# Patient Record
Sex: Female | Born: 1980 | Race: White | Hispanic: No | Marital: Married | Smoking: Former smoker
Health system: Southern US, Community
[De-identification: ages and names within clinical notes are randomized; demographics above are authoritative.]

## PROBLEM LIST (undated history)

## (undated) DIAGNOSIS — G4730 Sleep apnea, unspecified: Secondary | ICD-10-CM

## (undated) DIAGNOSIS — F329 Major depressive disorder, single episode, unspecified: Secondary | ICD-10-CM

## (undated) DIAGNOSIS — F1021 Alcohol dependence, in remission: Secondary | ICD-10-CM

## (undated) DIAGNOSIS — K219 Gastro-esophageal reflux disease without esophagitis: Secondary | ICD-10-CM

## (undated) DIAGNOSIS — F32A Depression, unspecified: Secondary | ICD-10-CM

## (undated) DIAGNOSIS — J302 Other seasonal allergic rhinitis: Secondary | ICD-10-CM

## (undated) DIAGNOSIS — F419 Anxiety disorder, unspecified: Secondary | ICD-10-CM

## (undated) DIAGNOSIS — A630 Anogenital (venereal) warts: Secondary | ICD-10-CM

## (undated) DIAGNOSIS — D696 Thrombocytopenia, unspecified: Secondary | ICD-10-CM

## (undated) HISTORY — DX: Alcohol dependence, in remission: F10.21

## (undated) HISTORY — DX: Sleep apnea, unspecified: G47.30

## (undated) HISTORY — PX: WISDOM TOOTH EXTRACTION: SHX21

---

## 2001-05-18 ENCOUNTER — Emergency Department (HOSPITAL_COMMUNITY): Admission: EM | Admit: 2001-05-18 | Discharge: 2001-05-18 | Payer: Self-pay | Admitting: Emergency Medicine

## 2001-08-21 ENCOUNTER — Emergency Department (HOSPITAL_COMMUNITY): Admission: EM | Admit: 2001-08-21 | Discharge: 2001-08-21 | Payer: Self-pay | Admitting: *Deleted

## 2001-09-04 ENCOUNTER — Inpatient Hospital Stay (HOSPITAL_COMMUNITY): Admission: EM | Admit: 2001-09-04 | Discharge: 2001-09-08 | Payer: Self-pay | Admitting: Psychiatry

## 2001-11-04 ENCOUNTER — Encounter: Payer: Self-pay | Admitting: Emergency Medicine

## 2001-11-04 ENCOUNTER — Emergency Department (HOSPITAL_COMMUNITY): Admission: EM | Admit: 2001-11-04 | Discharge: 2001-11-04 | Payer: Self-pay | Admitting: Emergency Medicine

## 2002-11-14 ENCOUNTER — Emergency Department (HOSPITAL_COMMUNITY): Admission: EM | Admit: 2002-11-14 | Discharge: 2002-11-14 | Payer: Self-pay | Admitting: Emergency Medicine

## 2002-11-21 ENCOUNTER — Emergency Department (HOSPITAL_COMMUNITY): Admission: EM | Admit: 2002-11-21 | Discharge: 2002-11-21 | Payer: Self-pay | Admitting: Emergency Medicine

## 2010-10-20 ENCOUNTER — Other Ambulatory Visit
Admission: RE | Admit: 2010-10-20 | Discharge: 2010-10-20 | Payer: Self-pay | Source: Home / Self Care | Admitting: Obstetrics and Gynecology

## 2011-03-10 NOTE — Discharge Summary (Signed)
[facility]  Patient:    Patricia Rivas, Patricia Rivas Visit Number: [id number] MRN: [id number]          Service Type: PSY Location: 500 0501 01 Attending Physician:  Jeanice Lim Dictated by:   Reymundo Poll Dub Mikes, M.D. Admit Date:  09/04/2001 Discharge Date: 09/08/2001                             Discharge Summary  CHIEF COMPLAINT AND HISTORY OF PRESENT ILLNESS:  This was the first admission to [facility] for this 30 year old female who presented with a history of bipolar disorder.  Had come to live with a friend. They were talking about things in her life.  [place] she was hearing a pounding in her ears with thoughts of taking enough pills to overdose and kill herself.  Called [facility] in terms of doing any self-harm.  Inpatient treatment was recommended.  Reported increased depression for a month.  Her aunt had died about four weeks prior to this admission, her mother had some physical problems, she needed some surgery, and some financial stressors.  Sleeping had been decreased over the past couple of weeks.  Appetite had decreased with a 10 pound weight loss over a month.  She also had a history of borderline personality, history of biting herself.  She also had been drinking some more to calm herself down.  PAST PSYCHIATRIC HISTORY:  Dr. Vilinda Blanks on an outpatient basis.  Reported biting her arm and hand with the last time of self-injury the day before, biting her hand.  Also sees Dr. Mikey Bussing as a therapist.  SUBSTANCE ABUSE HISTORY:  Drinking has increased, drinking a drink or more per day up to five drinks per day stating that she gets more drunk than usual; history of blackouts with no seizures; to calm herself down.  PAST MEDICAL HISTORY:  Thrombocytopenia.  MEDICATIONS ON ADMISSION: 1. Trileptal 1200 mg. 2. Zoloft 100 mg per day.  PHYSICAL EXAMINATION:  GENERAL:   Performed and failed to show any active findings.  MENTAL STATUS EXAMINATION ON ADMISSION:  Alert young female dressed casually, poor eye contact.  Speech was normal and relevant.  Mood was euthymic.  Affect was pleasant.  Thought processes: Coherent, no evidence of psychosis, no auditory or visual hallucinations, no suicidal or homicidal ideas, no paranoia.  Cognitive: Intact.  ADMITTING DIAGNOSES: Axis I:    1. Bipolar disorder, depressed.            2. Alcohol abuse. Axis II:   Borderline personality disorder. Axis III:  Thrombocytopenia. Axis IV:   Moderate. Axis V:    Global assessment of functioning upon admission 30, highest global            assessment of functioning in the last year 60-65.  HOSPITAL COURSE:  She was admitted and started in intensive individual and group psychotherapy.  She was placed on Seroquel 50 mg per day as well as Lamictal 25 mg twice a day.  She had been off her carbatrol due to side effects, has had a lot of stressful events going on which had precipitated this decompensation.  Family session between the patient and her partner was positive, soliciting support from her.  On November 17, her mood had improved. Affect was brighter.  Less depressed and anxious, no suicidal ideas, no homicidal ideas, no psychosis or perceptual disturbance.  Committed to active recovery.  Able to be managed safely in a less restrictive setting for which discharge was considered and granted.  DISCHARGE DIAGNOSES: Axis I:    1. Bipolar disorder, depressed.            2. Alcohol abuse. Axis II:   Borderline personality disorder. Axis III:  Thrombocytopenia. Axis IV:   Moderate. Axis V:    Global assessment of functioning upon discharge 55-60.  DISCHARGE MEDICATIONS: 1. Zoloft 100 mg per day. 2. Lamictal 25 mg twice a day. 3. Seroquel 50 mg at bedtime. 4. Ambien 10 mg at bedtime for sleep.  FOLLOWUP:  [facility] PACT Team. Dictated by:    Reymundo Poll. Dub Mikes, M.D. Attending Physician:  Jeanice Lim DD:  10/09/01 TD:  10/10/01 Job: [id number]
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
## 2012-01-25 ENCOUNTER — Other Ambulatory Visit (HOSPITAL_COMMUNITY)
Admission: RE | Admit: 2012-01-25 | Discharge: 2012-01-25 | Disposition: A | Discharge status: Home / Self Care | Payer: Self-pay | Source: Ambulatory Visit | Attending: Obstetrics and Gynecology | Admitting: Obstetrics and Gynecology

## 2012-01-25 DIAGNOSIS — Z01419 Encounter for gynecological examination (general) (routine) without abnormal findings: Secondary | ICD-10-CM | POA: Insufficient documentation

## 2012-10-23 NOTE — L&D Delivery Note (Signed)
Delivery Note At 7:12 PM a viable female was delivered via Vaginal, Spontaneous Delivery (Presentation: Middle Occiput Posterior).  APGAR8 and 9  per neonatologist , ; weight .   Placenta status: , Pathology.  Cord: 3 vessels with the following complications: None.  Cord pH: pending   Anesthesia: Epidural  Episiotomy: None Lacerations: 1st degree;Perineal Suture Repair: 3.0 vicryl Est. Blood Loss (mL): 200 ml  Mom to postpartum.  Baby to NICU.  Jibri Schriefer J. 05/12/2013, 7:29 PM

## 2012-11-11 LAB — OB RESULTS CONSOLE HEPATITIS B SURFACE ANTIGEN: Hepatitis B Surface Ag: NEGATIVE

## 2012-11-11 LAB — OB RESULTS CONSOLE ANTIBODY SCREEN: Antibody Screen: NEGATIVE

## 2012-11-11 LAB — OB RESULTS CONSOLE RPR: RPR: NONREACTIVE

## 2012-11-20 ENCOUNTER — Telehealth: Payer: Self-pay | Admitting: Internal Medicine

## 2012-11-20 NOTE — Telephone Encounter (Signed)
S/W WITH PT IN REF TO NP APPT. ON 12/03/12@9 :30 REFERRING DR Richardson Dopp DX-THROMBOCYTOPENIA-PREGNANT MAILED NP APPT.

## 2012-11-20 NOTE — Telephone Encounter (Signed)
C/D 11/20/12 for appt. 12/03/12

## 2012-11-27 LAB — OB RESULTS CONSOLE GC/CHLAMYDIA
Chlamydia: NEGATIVE
Gonorrhea: NEGATIVE

## 2012-12-03 ENCOUNTER — Ambulatory Visit: Payer: Managed Care, Other (non HMO)

## 2012-12-03 ENCOUNTER — Encounter: Payer: Self-pay | Admitting: Internal Medicine

## 2012-12-03 ENCOUNTER — Telehealth: Payer: Self-pay | Admitting: Internal Medicine

## 2012-12-03 ENCOUNTER — Other Ambulatory Visit (HOSPITAL_BASED_OUTPATIENT_CLINIC_OR_DEPARTMENT_OTHER): Payer: Managed Care, Other (non HMO) | Admitting: Lab

## 2012-12-03 ENCOUNTER — Ambulatory Visit (HOSPITAL_BASED_OUTPATIENT_CLINIC_OR_DEPARTMENT_OTHER): Payer: Managed Care, Other (non HMO) | Admitting: Internal Medicine

## 2012-12-03 VITALS — BP 136/78 | HR 86 | Temp 98.1°F | Resp 20 | Ht 66.0 in | Wt 236.6 lb

## 2012-12-03 DIAGNOSIS — D689 Coagulation defect, unspecified: Secondary | ICD-10-CM

## 2012-12-03 DIAGNOSIS — D696 Thrombocytopenia, unspecified: Secondary | ICD-10-CM

## 2012-12-03 HISTORY — DX: Thrombocytopenia, unspecified: D69.6

## 2012-12-03 LAB — CBC WITH DIFFERENTIAL/PLATELET
BASO%: 0.3 % (ref 0.0–2.0)
Basophils Absolute: 0 10*3/uL (ref 0.0–0.1)
EOS%: 0.8 % (ref 0.0–7.0)
Eosinophils Absolute: 0.1 10*3/uL (ref 0.0–0.5)
HCT: 41 % (ref 34.8–46.6)
HGB: 14.2 g/dL (ref 11.6–15.9)
LYMPH%: 24.7 % (ref 14.0–49.7)
MCH: 28.7 pg (ref 25.1–34.0)
MCHC: 34.6 g/dL (ref 31.5–36.0)
MCV: 83 fL (ref 79.5–101.0)
MONO#: 0.5 10*3/uL (ref 0.1–0.9)
MONO%: 6.6 % (ref 0.0–14.0)
NEUT#: 5.3 10*3/uL (ref 1.5–6.5)
NEUT%: 67.6 % (ref 38.4–76.8)
Platelets: 97 10*3/uL — ABNORMAL LOW (ref 145–400)
RBC: 4.94 10*6/uL (ref 3.70–5.45)
RDW: 12.2 % (ref 11.2–14.5)
WBC: 7.8 10*3/uL (ref 3.9–10.3)
lymph#: 1.9 10*3/uL (ref 0.9–3.3)
nRBC: 0 % (ref 0–0)

## 2012-12-03 LAB — COMPREHENSIVE METABOLIC PANEL (CC13)
ALT: 23 U/L (ref 0–55)
CO2: 24 mEq/L (ref 22–29)
Calcium: 10.1 mg/dL (ref 8.4–10.4)
Chloride: 104 mEq/L (ref 98–107)
Creatinine: 0.6 mg/dL (ref 0.6–1.1)
Glucose: 87 mg/dl (ref 70–99)
Total Protein: 7.3 g/dL (ref 6.4–8.3)

## 2012-12-03 LAB — LACTATE DEHYDROGENASE (CC13): LDH: 178 U/L (ref 125–245)

## 2012-12-03 NOTE — Progress Notes (Signed)
[facility] [phone]   [phone]  CONSULT NOTE  REFERRING PHYSICIAN: Dr. Gerald Leitz  REASON FOR CONSULTATION:  32 years old pregnant lady with thrombocytopenia  HPI Patricia Rivas is a 32 y.o. female with no significant past medical history except for long history of thrombocytopenia ranging between 55,000-80,000 since age 50 but with no significant bleeding issues. The patient is currently [redacted] weeks pregnant. She was seen recently by her obstetrician and CBC showed platelets count of 67,000. She was referred to me today for further evaluation and recommendation regarding her thrombocytopenia. The patient denied having any bleeding issues, bruises or ecchymosis. She denied having any, or nosebleeds. She has a history of nosebleeds as a teenager but not since that time. She previously evaluated by hematologist in [place], [place] but this has been many years ago.  She is feeling fine today with no specific complaints. She denied having any fever or chills. She has no chest pain or shortness of breath. HIV test was nonreactive. Hepatitis B screening was negative. TSH was normal. Blood work by her obstetrician showed positive rheumatoid factor.   PAST MEDICAL HISTORY: History of thrombocytopenia and depression. The patient denied having any history of hypertension, diabetes mellitus, coronary artery disease, hypercholesterolemia or stroke.  FAMILY HISTORY: Mother had heart disease and father prostate and colon cancer   SOCIAL HISTORY: She is married and currently pregnant and her first child 11 weeks. 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 She is a former smoker smoked for around 15 years but quit to one half years ago. She also quit alcohol a year ago and no history of drug abuse.      No Known Allergies  Current Outpatient Prescriptions  Medication Sig Dispense Refill  . docusate sodium (COLACE) 100 MG capsule Take 100 mg by mouth  daily.      . Prenatal Vit-Fe Fumarate-FA (MULTIVITAMIN-PRENATAL) 27-0.8 MG TABS Take 1 tablet by mouth daily.       No current facility-administered medications for this visit.    Review of Systems  A comprehensive review of systems was negative.  Physical Exam  AOZ:HYQMV, healthy, no distress, well nourished and well developed SKIN: skin color, texture, turgor are normal HEAD: Normocephalic, No masses, lesions, tenderness or abnormalities EYES: normal, PERRLA EARS: External ears normal OROPHARYNX:no exudate and no erythema  NECK: supple, no adenopathy LYMPH:  no palpable lymphadenopathy, no hepatosplenomegaly BREAST:not examined LUNGS: clear to auscultation  HEART: regular rate & rhythm and no murmurs ABDOMEN:abdomen soft, non-tender, obese, normal bowel sounds and no masses or organomegaly BACK: Back symmetric, no curvature. EXTREMITIES:no joint deformities, effusion, or inflammation, no edema, no skin discoloration, no clubbing  NEURO: alert & oriented x 3 with fluent speech, no focal motor/sensory deficits  PERFORMANCE STATUS: ECOG 0  LABORATORY DATA: Lab Results  Component Value Date   WBC 7.8 12/03/2012   HGB 14.2 12/03/2012   HCT 41.0 12/03/2012   MCV 83.0 12/03/2012   PLT 97* 12/03/2012      Chemistry      Component Value Date/Time   NA 137 12/03/2012 1030   K 3.8 12/03/2012 1030   CL 104 12/03/2012 1030   CO2 24 12/03/2012 1030   BUN 10.9 12/03/2012 1030   CREATININE 0.6 12/03/2012 1030      Component Value Date/Time   CALCIUM 10.1 12/03/2012 1030   ALKPHOS 61 12/03/2012 1030   AST 18 12/03/2012 1030   ALT 23 12/03/2012 1030   BILITOT  0.38 12/03/2012 1030       RADIOGRAPHIC STUDIES: No results found.  ASSESSMENT: This is a very pleasant 32 years old white female with history of persistent thrombocytopenia suspicious for ITP. The patient is currently pregnant [redacted] weeks. Her platelets count today 97,000 which is higher than what was seen at her obstetrician's  office. There was also few clumps of her platelets on the peripheral smear.  PLAN: I have a lengthy discussion with the patient today about her condition. The patient is currently asymptomatic with no bleeding, bruises or ecchymosis. I recommended for her continuous observation for now. I would see her back for followup visit in 2 months with repeat CBC and LDH. The patient was advised to call me immediately if she has any significant bleeding, bruises or ecchymosis or if her platelets count is less than 50,000. I will continue to monitor her platelets counts closely during her pregnancy. This is expected to go further down as the patient progress with her pregnancy secondary to dilutional effect with increasing volume of her blood in the third trimester. She could be considered for treatment with prednisone in the third trimester in preparation for delivery of her platelets count is less than 80,000. I gave the patient the time to ask questions and I answered them completely to her satisfaction. She knows to contact me immediately if she has any concerning symptoms All questions were answered. The patient knows to call the clinic with any problems, questions or concerns. We can certainly see the patient much sooner if necessary.  Thank you so much for allowing me to participate in the care of Patricia Rivas. I will continue to follow up the patient with you and assist in her care.  I spent 35 minutes counseling the patient face to face. The total time spent in the appointment was 55 minutes.  Jomaira Darr K. 12/03/2012, 11:19 AM

## 2012-12-03 NOTE — Progress Notes (Signed)
Checked in new patient. No financial needs. She just got married so insurance is still in her maiden name. Married name is Castner.

## 2012-12-03 NOTE — Patient Instructions (Signed)
Platelets count are low but stable and you are currently asymptomatic. Continue on observation for now with repeat CBC in 2 months. Please call if you have any questions.

## 2013-02-04 ENCOUNTER — Ambulatory Visit (HOSPITAL_BASED_OUTPATIENT_CLINIC_OR_DEPARTMENT_OTHER): Payer: Managed Care, Other (non HMO) | Admitting: Internal Medicine

## 2013-02-04 ENCOUNTER — Encounter: Payer: Self-pay | Admitting: Internal Medicine

## 2013-02-04 ENCOUNTER — Telehealth: Payer: Self-pay | Admitting: Internal Medicine

## 2013-02-04 ENCOUNTER — Other Ambulatory Visit (HOSPITAL_BASED_OUTPATIENT_CLINIC_OR_DEPARTMENT_OTHER): Payer: Managed Care, Other (non HMO) | Admitting: Lab

## 2013-02-04 VITALS — BP 129/58 | HR 74 | Temp 98.5°F | Resp 18 | Ht 66.0 in | Wt 241.9 lb

## 2013-02-04 DIAGNOSIS — D696 Thrombocytopenia, unspecified: Secondary | ICD-10-CM

## 2013-02-04 DIAGNOSIS — O99891 Other specified diseases and conditions complicating pregnancy: Secondary | ICD-10-CM

## 2013-02-04 DIAGNOSIS — D693 Immune thrombocytopenic purpura: Secondary | ICD-10-CM

## 2013-02-04 LAB — CBC WITH DIFFERENTIAL/PLATELET
BASO%: 0.5 % (ref 0.0–2.0)
EOS%: 1.1 % (ref 0.0–7.0)
HGB: 13.1 g/dL (ref 11.6–15.9)
MCH: 28 pg (ref 25.1–34.0)
MCHC: 33.5 g/dL (ref 31.5–36.0)
RDW: 12.5 % (ref 11.2–14.5)
WBC: 11.3 10*3/uL — ABNORMAL HIGH (ref 3.9–10.3)
lymph#: 1.9 10*3/uL (ref 0.9–3.3)

## 2013-02-04 LAB — COMPREHENSIVE METABOLIC PANEL (CC13)
ALT: 14 U/L (ref 0–55)
AST: 13 U/L (ref 5–34)
Albumin: 2.8 g/dL — ABNORMAL LOW (ref 3.5–5.0)
Alkaline Phosphatase: 74 U/L (ref 40–150)
BUN: 8.9 mg/dL (ref 7.0–26.0)
Calcium: 9.2 mg/dL (ref 8.4–10.4)
Chloride: 105 mEq/L (ref 98–107)
Potassium: 3.9 mEq/L (ref 3.5–5.1)
Sodium: 137 mEq/L (ref 136–145)

## 2013-02-04 LAB — LACTATE DEHYDROGENASE (CC13): LDH: 163 U/L (ref 125–245)

## 2013-02-04 NOTE — Progress Notes (Signed)
[facility] [phone]   [phone]  OFFICE PROGRESS NOTE  No primary provider on file. No primary provider on file.  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  PRIOR THERAPY: None  CURRENT THERAPY: Observation.  INTERVAL HISTORY: Patricia Rivas 32 y.o. female returns to the clinic today for routine three-month followup visit. The patient is feeling fine today with no specific complaints. She denied having any significant bleeding issues, bruises or ecchymosis. Her pregnancy is progressing fairly well with no significant complaints. The patient had repeat CBC performed earlier today and she is here for evaluation and discussion of her lab results. She is currently [redacted] weeks pregnant. She denied having any significant fatigue or weakness. She denied having any chest pain, shortness of breath, cough or hemoptysis.  ALLERGIES:  has No Known Allergies.  MEDICATIONS:  Current Outpatient Prescriptions  Medication Sig Dispense Refill  . Docosahexaenoic Acid (DHA OMEGA 3 PO) Take 1 capsule by mouth daily.      Marland Kitchen docusate sodium (COLACE) 100 MG capsule Take 100 mg by mouth daily.      . Prenatal Vit-Fe Fumarate-FA (MULTIVITAMIN-PRENATAL) 27-0.8 MG TABS Take 1 tablet by mouth daily.      . ranitidine (ZANTAC) 150 MG capsule Take 150 mg by mouth every evening.       No current facility-administered medications for this visit.    REVIEW OF SYSTEMS:  A comprehensive review of systems was negative.   PHYSICAL EXAMINATION: General appearance: alert, cooperative and no distress Head: Normocephalic, without obvious abnormality, atraumatic Neck: no adenopathy Lymph nodes: Cervical, supraclavicular, and axillary nodes normal. Resp: clear to auscultation bilaterally Cardio: regular rate and rhythm, S1, S2 normal, no murmur, click, rub or gallop GI: Distended corresponding to her current pregnancy Extremities: extremities normal,  atraumatic, no cyanosis or edema  ECOG PERFORMANCE STATUS: 0 - Asymptomatic  Blood pressure 129/58, pulse 74, temperature 98.5 F (36.9 C), temperature source Oral, resp. rate 18, height 5\' 6"  (1.676 m), weight 241 lb 14.4 oz (109.725 kg).  LABORATORY DATA: Lab Results  Component Value Date   WBC 11.3* 02/04/2013   HGB 13.1 02/04/2013   HCT 39.0 02/04/2013   MCV 83.6 02/04/2013   PLT 79* 02/04/2013      Chemistry      Component Value Date/Time   NA 137 12/03/2012 1030   K 3.8 12/03/2012 1030   CL 104 12/03/2012 1030   CO2 24 12/03/2012 1030   BUN 10.9 12/03/2012 1030   CREATININE 0.6 12/03/2012 1030      Component Value Date/Time   CALCIUM 10.1 12/03/2012 1030   ALKPHOS 61 12/03/2012 1030   AST 18 12/03/2012 1030   ALT 23 12/03/2012 1030   BILITOT 0.38 12/03/2012 1030       RADIOGRAPHIC STUDIES: No results found.  ASSESSMENT: This is a very pleasant 32 years old white female who is currently [redacted] weeks pregnant with history of idiopathic thrombocytopenic purpura. The patient is currently asymptomatic in her platelets count are still acceptable at 79,000.   PLAN: I discussed the lab result with the patient. I recommended for her to continue on observation with repeat CBC in 2 months. She was advised to call me immediately she has any concerning symptoms in the interval her platelets count less than 50,000.  All questions were answered. The patient knows to call the clinic with any problems, questions or concerns. We can certainly see the patient much sooner if necessary.

## 2013-02-04 NOTE — Patient Instructions (Signed)
Platelets count are still low but stable. Followup in 2 months with repeat CBC.

## 2013-04-07 ENCOUNTER — Encounter: Payer: Self-pay | Admitting: Internal Medicine

## 2013-04-07 ENCOUNTER — Telehealth: Payer: Self-pay | Admitting: Internal Medicine

## 2013-04-07 ENCOUNTER — Other Ambulatory Visit (HOSPITAL_BASED_OUTPATIENT_CLINIC_OR_DEPARTMENT_OTHER): Payer: Managed Care, Other (non HMO) | Admitting: Lab

## 2013-04-07 ENCOUNTER — Ambulatory Visit (HOSPITAL_BASED_OUTPATIENT_CLINIC_OR_DEPARTMENT_OTHER): Payer: Managed Care, Other (non HMO) | Admitting: Internal Medicine

## 2013-04-07 VITALS — BP 129/59 | HR 74 | Temp 97.9°F | Resp 18 | Ht 66.0 in | Wt 246.9 lb

## 2013-04-07 DIAGNOSIS — D696 Thrombocytopenia, unspecified: Secondary | ICD-10-CM

## 2013-04-07 DIAGNOSIS — D693 Immune thrombocytopenic purpura: Secondary | ICD-10-CM

## 2013-04-07 DIAGNOSIS — Z331 Pregnant state, incidental: Secondary | ICD-10-CM

## 2013-04-07 LAB — CBC WITH DIFFERENTIAL/PLATELET
BASO%: 0.5 % (ref 0.0–2.0)
Basophils Absolute: 0.1 10*3/uL (ref 0.0–0.1)
EOS%: 0.8 % (ref 0.0–7.0)
HGB: 13 g/dL (ref 11.6–15.9)
MCH: 28.5 pg (ref 25.1–34.0)
MCHC: 34.3 g/dL (ref 31.5–36.0)
MCV: 83.1 fL (ref 79.5–101.0)
MONO%: 5.2 % (ref 0.0–14.0)
NEUT%: 79.6 % — ABNORMAL HIGH (ref 38.4–76.8)
RDW: 12.6 % (ref 11.2–14.5)

## 2013-04-07 NOTE — Progress Notes (Signed)
[facility] [phone]   [phone]  OFFICE PROGRESS NOTE  Jessee Avers., MD 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   PRIOR THERAPY: None   CURRENT THERAPY: Observation.  INTERVAL HISTORY: Patricia Rivas 32 y.o. female returns to the clinic today for routine followup visit. The patient is currently [redacted] week pregnant. She denied having any bleeding issues. She denied having any bruises or ecchymosis. She continues to have mild fatigue. She has repeat CBC performed earlier today and she is here for evaluation and discussion of her lab results. She denied having any nausea or vomiting, no chest pain, no shortness breath, cough or hemoptysis.  ALLERGIES:  has No Known Allergies.  MEDICATIONS:  Current Outpatient Prescriptions  Medication Sig Dispense Refill  . Docosahexaenoic Acid (DHA OMEGA 3 PO) Take 1 capsule by mouth daily.      Marland Kitchen docusate sodium (COLACE) 100 MG capsule Take 100 mg by mouth daily.      . Prenatal Vit-Fe Fumarate-FA (MULTIVITAMIN-PRENATAL) 27-0.8 MG TABS Take 1 tablet by mouth daily.      . ranitidine (ZANTAC) 150 MG capsule Take 150 mg by mouth every evening.       No current facility-administered medications for this visit.    REVIEW OF SYSTEMS:  A comprehensive review of systems was negative.   PHYSICAL EXAMINATION: General appearance: alert, cooperative, fatigued and no distress Head: Normocephalic, without obvious abnormality, atraumatic Neck: no adenopathy Lymph nodes: Cervical, supraclavicular, and axillary nodes normal. Resp: clear to auscultation bilaterally Cardio: regular rate and rhythm, S1, S2 normal, no murmur, click, rub or gallop GI: Distended secondary to pregnancy Extremities: extremities normal, atraumatic, no cyanosis or edema  ECOG PERFORMANCE STATUS: 1 - Symptomatic but completely ambulatory  Blood pressure 129/59,  pulse 74, temperature 97.9 F (36.6 C), temperature source Oral, resp. rate 18, height 5\' 6"  (1.676 m), weight 246 lb 14.4 oz (111.993 kg).  LABORATORY DATA: Lab Results  Component Value Date   WBC 11.1* 04/07/2013   HGB 13.0 04/07/2013   HCT 37.9 04/07/2013   MCV 83.1 04/07/2013   PLT 63* 04/07/2013      Chemistry      Component Value Date/Time   NA 137 02/04/2013 1102   K 3.9 02/04/2013 1102   CL 105 02/04/2013 1102   CO2 22 02/04/2013 1102   BUN 8.9 02/04/2013 1102   CREATININE 0.6 02/04/2013 1102      Component Value Date/Time   CALCIUM 9.2 02/04/2013 1102   ALKPHOS 74 02/04/2013 1102   AST 13 02/04/2013 1102   ALT 14 02/04/2013 1102   BILITOT 0.23 02/04/2013 1102       RADIOGRAPHIC STUDIES: No results found.  ASSESSMENT AND PLAN: This is a very pleasant 32 years old white female with history of idiopathic thrombocytopenic purpura, currently pregnant [redacted] weeks. The patient is currently asymptomatic from her ITP. She has no bleeding, bruises or ecchymosis. Platelets count are 63,000 today. I will continue to monitor the patient closely. I would see her back for followup visit in 6 weeks for reevaluation before her delivery. I may consider her for steroid treatment at that time to increase her platelets count in preparation for the delivery. The patient was advised to call me immediately if she has any concerning symptoms in the interval.  All questions were answered. The patient knows to call the clinic with any problems, questions or concerns.  We can certainly see the patient much sooner if necessary.

## 2013-04-07 NOTE — Telephone Encounter (Signed)
gave pt appt for July 2014 lab and MD

## 2013-04-07 NOTE — Patient Instructions (Signed)
Continue on observation for now.  Followup visit in 6 weeks.

## 2013-04-29 ENCOUNTER — Telehealth: Payer: Self-pay | Admitting: Internal Medicine

## 2013-04-29 NOTE — Telephone Encounter (Signed)
lvm for pt regarding to time change of 7.28.14 appt....mailed pt appt sched/avs and letter

## 2013-05-10 ENCOUNTER — Inpatient Hospital Stay (HOSPITAL_COMMUNITY)
Admission: AD | Admit: 2013-05-10 | Discharge: 2013-05-14 | DRG: 775 | Disposition: A | Discharge status: Home / Self Care | Payer: Managed Care, Other (non HMO) | Source: Ambulatory Visit | Attending: Obstetrics and Gynecology | Admitting: Obstetrics and Gynecology

## 2013-05-10 DIAGNOSIS — O4290 Premature rupture of membranes, unspecified as to length of time between rupture and onset of labor, unspecified weeks of gestation: Secondary | ICD-10-CM | POA: Diagnosis present

## 2013-05-10 DIAGNOSIS — O26899 Other specified pregnancy related conditions, unspecified trimester: Secondary | ICD-10-CM | POA: Diagnosis present

## 2013-05-10 DIAGNOSIS — D689 Coagulation defect, unspecified: Secondary | ICD-10-CM | POA: Diagnosis present

## 2013-05-10 DIAGNOSIS — Z6791 Unspecified blood type, Rh negative: Secondary | ICD-10-CM | POA: Diagnosis present

## 2013-05-10 DIAGNOSIS — O360131 Maternal care for anti-D [Rh] antibodies, third trimester, fetus 1: Secondary | ICD-10-CM

## 2013-05-10 DIAGNOSIS — O09213 Supervision of pregnancy with history of pre-term labor, third trimester: Secondary | ICD-10-CM

## 2013-05-10 DIAGNOSIS — O42019 Preterm premature rupture of membranes, onset of labor within 24 hours of rupture, unspecified trimester: Secondary | ICD-10-CM | POA: Diagnosis present

## 2013-05-10 DIAGNOSIS — F411 Generalized anxiety disorder: Secondary | ICD-10-CM | POA: Diagnosis not present

## 2013-05-10 DIAGNOSIS — O4200 Premature rupture of membranes, onset of labor within 24 hours of rupture, unspecified weeks of gestation: Secondary | ICD-10-CM

## 2013-05-10 DIAGNOSIS — D696 Thrombocytopenia, unspecified: Secondary | ICD-10-CM | POA: Diagnosis present

## 2013-05-10 HISTORY — DX: Anogenital (venereal) warts: A63.0

## 2013-05-10 HISTORY — DX: Depression, unspecified: F32.A

## 2013-05-10 HISTORY — DX: Anxiety disorder, unspecified: F41.9

## 2013-05-10 HISTORY — DX: Major depressive disorder, single episode, unspecified: F32.9

## 2013-05-10 HISTORY — DX: Other seasonal allergic rhinitis: J30.2

## 2013-05-10 HISTORY — DX: Thrombocytopenia, unspecified: D69.6

## 2013-05-10 HISTORY — DX: Gastro-esophageal reflux disease without esophagitis: K21.9

## 2013-05-11 ENCOUNTER — Encounter (HOSPITAL_COMMUNITY): Payer: Self-pay | Admitting: Anesthesiology

## 2013-05-11 ENCOUNTER — Encounter (HOSPITAL_COMMUNITY): Payer: Self-pay | Admitting: *Deleted

## 2013-05-11 DIAGNOSIS — O42019 Preterm premature rupture of membranes, onset of labor within 24 hours of rupture, unspecified trimester: Secondary | ICD-10-CM | POA: Diagnosis present

## 2013-05-11 DIAGNOSIS — F411 Generalized anxiety disorder: Secondary | ICD-10-CM | POA: Diagnosis not present

## 2013-05-11 DIAGNOSIS — Z6791 Unspecified blood type, Rh negative: Secondary | ICD-10-CM | POA: Diagnosis present

## 2013-05-11 DIAGNOSIS — O26899 Other specified pregnancy related conditions, unspecified trimester: Secondary | ICD-10-CM | POA: Diagnosis present

## 2013-05-11 HISTORY — DX: Preterm premature rupture of membranes, onset of labor within 24 hours of rupture, unspecified trimester: O42.019

## 2013-05-11 HISTORY — DX: Generalized anxiety disorder: F41.1

## 2013-05-11 LAB — CBC
HCT: 37.6 % (ref 36.0–46.0)
Platelets: 76 10*3/uL — ABNORMAL LOW (ref 150–400)
RDW: 12.4 % (ref 11.5–15.5)
WBC: 15.1 10*3/uL — ABNORMAL HIGH (ref 4.0–10.5)

## 2013-05-11 LAB — URINALYSIS, ROUTINE W REFLEX MICROSCOPIC
Bilirubin Urine: NEGATIVE
Nitrite: NEGATIVE
Specific Gravity, Urine: 1.01 (ref 1.005–1.030)
pH: 6 (ref 5.0–8.0)

## 2013-05-11 LAB — OB RESULTS CONSOLE GBS: GBS: NEGATIVE

## 2013-05-11 LAB — COMPREHENSIVE METABOLIC PANEL
ALT: 13 U/L (ref 0–35)
AST: 15 U/L (ref 0–37)
Albumin: 2.9 g/dL — ABNORMAL LOW (ref 3.5–5.2)
CO2: 22 mEq/L (ref 19–32)
Calcium: 10.1 mg/dL (ref 8.4–10.5)
Creatinine, Ser: 0.56 mg/dL (ref 0.50–1.10)
Sodium: 135 mEq/L (ref 135–145)
Total Protein: 6.5 g/dL (ref 6.0–8.3)

## 2013-05-11 LAB — URINE MICROSCOPIC-ADD ON

## 2013-05-11 LAB — TYPE AND SCREEN
ABO/RH(D): O NEG
Weak D: POSITIVE

## 2013-05-11 LAB — GROUP B STREP BY PCR: Group B strep by PCR: NEGATIVE

## 2013-05-11 MED ORDER — ZOLPIDEM TARTRATE 5 MG PO TABS
5.0000 mg | ORAL_TABLET | Freq: Every evening | ORAL | Status: DC | PRN
  Administered 2013-05-11: 5 mg via ORAL
  Filled 2013-05-11: qty 1

## 2013-05-11 MED ORDER — CALCIUM CARBONATE ANTACID 500 MG PO CHEW: 2.0000 | CHEWABLE_TABLET | ORAL | Status: DC | PRN

## 2013-05-11 MED ORDER — MAGNESIUM SULFATE BOLUS VIA INFUSION
4.0000 g | Freq: Once | INTRAVENOUS | Status: AC
  Administered 2013-05-11: 4 g via INTRAVENOUS
  Filled 2013-05-11: qty 500

## 2013-05-11 MED ORDER — LACTATED RINGERS IV SOLN
INTRAVENOUS | Status: DC
  Administered 2013-05-11 – 2013-05-12 (×4): via INTRAVENOUS

## 2013-05-11 MED ORDER — AZITHROMYCIN 1 G PO PACK
1.0000 g | PACK | Freq: Once | ORAL | Status: AC
  Administered 2013-05-11: 1 g via ORAL
  Filled 2013-05-11: qty 1

## 2013-05-11 MED ORDER — LACTATED RINGERS IV SOLN: 500.0000 mL | INTRAVENOUS | Status: DC | PRN

## 2013-05-11 MED ORDER — ONDANSETRON HCL 4 MG/2ML IJ SOLN: 4.0000 mg | Freq: Four times a day (QID) | INTRAMUSCULAR | Status: DC | PRN

## 2013-05-11 MED ORDER — AMOXICILLIN 500 MG PO CAPS: 500.0000 mg | ORAL_CAPSULE | Freq: Three times a day (TID) | ORAL | Status: DC

## 2013-05-11 MED ORDER — SODIUM CHLORIDE 0.9 % IV SOLN
2.0000 g | Freq: Once | INTRAVENOUS | Status: AC
  Administered 2013-05-11: 2 g via INTRAVENOUS
  Filled 2013-05-11: qty 2000

## 2013-05-11 MED ORDER — MAGNESIUM SULFATE 40 G IN LACTATED RINGERS - SIMPLE
2.0000 g/h | INTRAVENOUS | Status: DC
  Filled 2013-05-11: qty 500

## 2013-05-11 MED ORDER — ACETAMINOPHEN 325 MG PO TABS
650.0000 mg | ORAL_TABLET | ORAL | Status: DC | PRN
  Administered 2013-05-11: 650 mg via ORAL
  Filled 2013-05-11: qty 2

## 2013-05-11 MED ORDER — SODIUM CHLORIDE 0.9 % IV SOLN
2.0000 g | Freq: Four times a day (QID) | INTRAVENOUS | Status: DC
  Administered 2013-05-11 – 2013-05-12 (×4): 2 g via INTRAVENOUS
  Filled 2013-05-11 (×6): qty 2000

## 2013-05-11 MED ORDER — BETAMETHASONE SOD PHOS & ACET 6 (3-3) MG/ML IJ SUSP
12.0000 mg | Freq: Two times a day (BID) | INTRAMUSCULAR | Status: AC
  Administered 2013-05-11 (×2): 12 mg via INTRAMUSCULAR
  Filled 2013-05-11 (×3): qty 2

## 2013-05-11 MED ORDER — OXYTOCIN 40 UNITS IN LACTATED RINGERS INFUSION - SIMPLE MED: 62.5000 mL/h | INTRAVENOUS | Status: DC

## 2013-05-11 MED ORDER — PRENATAL MULTIVITAMIN CH: 1.0000 | ORAL_TABLET | Freq: Every day | ORAL | Status: DC

## 2013-05-11 MED ORDER — MAGNESIUM SULFATE 40 MG/ML IJ SOLN
4.0000 g | Freq: Once | INTRAMUSCULAR | Status: DC
  Filled 2013-05-11: qty 100

## 2013-05-11 MED ORDER — OXYCODONE-ACETAMINOPHEN 5-325 MG PO TABS: 1.0000 | ORAL_TABLET | ORAL | Status: DC | PRN

## 2013-05-11 MED ORDER — CITRIC ACID-SODIUM CITRATE 334-500 MG/5ML PO SOLN: 30.0000 mL | ORAL | Status: DC | PRN

## 2013-05-11 MED ORDER — LIDOCAINE HCL (PF) 1 % IJ SOLN: 30.0000 mL | INTRAMUSCULAR | Status: DC | PRN

## 2013-05-11 MED ORDER — MAGNESIUM SULFATE 40 G IN LACTATED RINGERS - SIMPLE
2.0000 g/h | INTRAVENOUS | Status: DC
  Administered 2013-05-12: 2 g/h via INTRAVENOUS
  Filled 2013-05-11 (×2): qty 500

## 2013-05-11 MED ORDER — DOCUSATE SODIUM 100 MG PO CAPS
100.0000 mg | ORAL_CAPSULE | Freq: Every day | ORAL | Status: DC
  Administered 2013-05-11: 100 mg via ORAL
  Filled 2013-05-11: qty 1

## 2013-05-11 MED ORDER — IBUPROFEN 600 MG PO TABS: 600.0000 mg | ORAL_TABLET | Freq: Four times a day (QID) | ORAL | Status: DC | PRN

## 2013-05-11 MED ORDER — OXYTOCIN BOLUS FROM INFUSION: 500.0000 mL | INTRAVENOUS | Status: DC

## 2013-05-11 MED ORDER — NALBUPHINE SYRINGE 5 MG/0.5 ML
5.0000 mg | INJECTION | INTRAMUSCULAR | Status: DC | PRN
  Filled 2013-05-11: qty 0.5

## 2013-05-11 NOTE — H&P (Signed)
Patricia Rivas is a 32 y.o. female presenting for rupture of membranes at 11pm on Saturday evening Maternal Medical History:  Reason for admission: Rupture of membranes and contractions.   Contractions: Onset was 3-5 hours ago.   Frequency: irregular.   Perceived severity is moderate.    Fetal activity: Perceived fetal activity is normal.   Last perceived fetal movement was within the past hour.    Prenatal complications: Thrombocytopenia.     OB History   Grav Para Term Preterm Abortions TAB SAB Ect Mult Living   3 0   2 2    0     Past Medical History  Diagnosis Date  . Thrombocytopenia    Past Surgical History  Procedure Laterality Date  . Wisdom tooth extraction     Family History: family history includes Cancer in her father and maternal aunt. Social History:  reports that she quit smoking about 6 months ago. Her smoking use included Cigarettes. She has a 22.5 pack-year smoking history. She does not have any smokeless tobacco history on file. She reports that she does not drink alcohol or use illicit drugs.   Prenatal Transfer Tool  Maternal Diabetes: No Genetic Screening: Normal Quad screen Maternal Ultrasounds/Referrals: Normal Fetal Ultrasounds or other Referrals:  None Maternal Substance Abuse:  No.   Significant Maternal Medications:  Meds include: Zantac Significant Maternal Lab Results:  Lab values include: Other: Thrombocytopenia Other Comments:  None  Review of Systems  Constitutional: Negative.   HENT: Negative.   Eyes: Negative.   Respiratory: Negative.   Gastrointestinal: Positive for heartburn.       Responded to Zantac  Genitourinary: Negative.   Musculoskeletal: Negative.   Skin: Negative.   Neurological: Negative.   Endo/Heme/Allergies: Negative.   Psychiatric/Behavioral: Positive for depression and substance abuse.       Anxiety and depression.  Meds discontinued with onset of pregnancy and no recurrence of sx. Hx alcohol abuse in the  past.  No alcohol for 3 years.    Dilation: Closed Effacement (%): 80 Exam by:: Patricia Suydam MD Blood pressure 137/80, pulse 82, temperature 98.2 F (36.8 C), temperature source Oral, resp. rate 18, weight 259 lb (117.482 kg), SpO2 100.00%. Maternal Exam:  Uterine Assessment: Contraction strength is moderate.  Contraction frequency is irregular.   Abdomen: Patient reports no abdominal tenderness. Fetal presentation: vertex  Introitus: Normal vulva. Normal vagina.    Physical Exam  Constitutional: She is oriented to person, place, and time. She appears well-developed and well-nourished.  HENT:  Head: Normocephalic and atraumatic.  Eyes: Conjunctivae and EOM are normal.  Neck: Normal range of motion. Neck supple.  Cardiovascular: Normal rate and regular rhythm.   Respiratory: Effort normal and breath sounds normal.  GI: Soft.  Genitourinary: Vagina normal.  Large amt of clear fluid in vault.  No bleeding.  No vaginal or cervical lesions.  Musculoskeletal: Normal range of motion.  Neurological: She is alert and oriented to person, place, and time.  Skin: Skin is warm and dry.  Psychiatric: She has a normal mood and affect.    Prenatal labs: ABO, Rh:  O pos Antibody:  neg Rubella:  Immune RPR:   NR HBsAg:  Neg  HIV:   Neg GBS:   Pending  Results for orders placed during the hospital encounter of 05/10/13 (from the past 24 hour(s))  POCT FERN TEST     Status: None   Collection Time    05/11/13 12:42 AM      Result Value  Range   POCT Fern Test Positive = ruptured amniotic membanes    GROUP B STREP BY PCR     Status: None   Collection Time    05/11/13  1:33 AM      Result Value Range   Group B strep by PCR NEGATIVE  NEGATIVE  CBC     Status: Abnormal   Collection Time    05/11/13  1:58 AM      Result Value Range   WBC 15.1 (*) 4.0 - 10.5 K/uL   RBC 4.66  3.87 - 5.11 MIL/uL   Hemoglobin 13.4  12.0 - 15.0 g/dL   HCT 95.2  84.1 - 32.4 %   MCV 80.7  78.0 - 100.0 fL   MCH  28.8  26.0 - 34.0 pg   MCHC 35.6  30.0 - 36.0 g/dL   RDW 40.1  02.7 - 25.3 %   Platelets 76 (*) 150 - 400 K/uL  URINALYSIS, ROUTINE W REFLEX MICROSCOPIC     Status: Abnormal   Collection Time    05/11/13  2:20 AM      Result Value Range   Color, Urine YELLOW  YELLOW   APPearance CLEAR  CLEAR   Specific Gravity, Urine 1.010  1.005 - 1.030   pH 6.0  5.0 - 8.0   Glucose, UA NEGATIVE  NEGATIVE mg/dL   Hgb urine dipstick TRACE (*) NEGATIVE   Bilirubin Urine NEGATIVE  NEGATIVE   Ketones, ur 15 (*) NEGATIVE mg/dL   Protein, ur NEGATIVE  NEGATIVE mg/dL   Urobilinogen, UA 0.2  0.0 - 1.0 mg/dL   Nitrite NEGATIVE  NEGATIVE   Leukocytes, UA NEGATIVE  NEGATIVE  URINE MICROSCOPIC-ADD ON     Status: None   Collection Time    05/11/13  2:20 AM      Result Value Range   Squamous Epithelial / LPF RARE  RARE   WBC, UA 0-2  <3 WBC/hpf   RBC / HPF 0-2  <3 RBC/hpf   Bacteria, UA RARE  RARE    Assessment/Plan: IUP at 33 w 5 days PPROM PTL Thrombcytopenia  Plan: Admit for anticipated impending delivery GC/CHL/GBS done NICU notified Latency antibiotics given Betamethasone Magnesium tocolysis until Betamethasone complete   Patricia Rivas 05/11/2013, 1:42 AM

## 2013-05-11 NOTE — Plan of Care (Signed)
Problem: Consults Goal: Birthing Suites Patient Information Press F2 to bring up selections list   Pt < [redacted] weeks EGA     

## 2013-05-11 NOTE — MAU Note (Addendum)
Water broke around 1045pm. Lots of clear fluid.

## 2013-05-11 NOTE — Anesthesia Preprocedure Evaluation (Deleted)
Anesthesia Evaluation  Patient identified by MRN, date of birth, ID band Patient awake    Reviewed: Allergy & Precautions, H&P , NPO status , Patient's Chart, lab work & pertinent test results  Airway Mallampati: III TM Distance: >3 FB Neck ROM: Full    Dental no notable dental hx. 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 No hx/o bleeding tendencies.Has been seeing heme/onc and they had planned po steroids after 34 weeks. Last platelet count 63k.    Anesthesia Other Findings   Reproductive/Obstetrics 33weeks 5 days SROM early labor On Magnesium Sulfate She has gotten 1 dose of Betamethasone                            Anesthesia Physical Anesthesia Plan  ASA: III  Anesthesia Plan:    Post-op Pain Management:    Induction:   Airway Management Planned:   Additional Equipment:   Intra-op Plan:   Post-operative Plan:   Informed Consent:   Plan Discussed with: Anesthesiologist  Anesthesia Plan Comments: (Discussed regional anesthesia in detail with patient and her husband. Depending on when she goes into active labor and what her platelet count is at that time will determine whether or not we can safely place an epidural catheter. I would recommend we get hematology involved in her care to see if they had any recommendations concerning steroids, dDAVP administration or other methods to increase  her platelet count.)        Anesthesia Quick Evaluation

## 2013-05-11 NOTE — Progress Notes (Signed)
Patient ID: Patricia Rivas, female   DOB: 1980/11/05, 32 y.o.   MRN: [id number] Patricia Rivas is a 32 y.o. G3P0020 at [redacted]w[redacted]d by LMP admitted for PROM  Subjective: GI: heartburn or reflux GU: neg OB: GOOD fetal movement.  Contractions less strong and less frequent        Objective: BP 129/73  Pulse 86  Temp(Src) 98.3 F (36.8 C) (Oral)  Resp 20  Ht 5\' 5"  (1.651 m)  Wt 259 lb (117.482 kg)  BMI 43.1 kg/m2  SpO2 100% I/O last 3 completed shifts: In: 1121.7 [P.O.:400; I.V.:671.7; IV Piggyback:50] Out: 1450 [Urine:1450] Total I/O In: 595 [P.O.:220; I.V.:375] Out: 800 [Urine:800]  FHT: reactive UC:   irreg and mild SVE:   Dilation: Closed Effacement (%): 80 Exam by:: Eldoris Beiser MD  Labs: Lab Results  Component Value Date   WBC 15.1* 05/11/2013   HGB 13.4 05/11/2013   HCT 37.6 05/11/2013   MCV 80.7 05/11/2013   PLT 76* 05/11/2013   Results for orders placed during the hospital encounter of 05/10/13 (from the past 24 hour(s))  OB RESULTS CONSOLE GBS     Status: None   Collection Time    05/11/13 12:00 AM      Result Value Range   GBS Negative    POCT FERN TEST     Status: None   Collection Time    05/11/13 12:42 AM      Result Value Range   POCT Fern Test Positive = ruptured amniotic membanes    GROUP B STREP BY PCR     Status: None   Collection Time    05/11/13  1:33 AM      Result Value Range   Group B strep by PCR NEGATIVE  NEGATIVE  TYPE AND SCREEN     Status: None   Collection Time    05/11/13  1:58 AM      Result Value Range   ABO/RH(D) O NEG     Antibody Screen NEG     Sample Expiration 05/14/2013    CBC     Status: Abnormal   Collection Time    05/11/13  1:58 AM      Result Value Range   WBC 15.1 (*) 4.0 - 10.5 K/uL   RBC 4.66  3.87 - 5.11 MIL/uL   Hemoglobin 13.4  12.0 - 15.0 g/dL   HCT 78.2  95.6 - 21.3 %   MCV 80.7  78.0 - 100.0 fL   MCH 28.8  26.0 - 34.0 pg   MCHC 35.6  30.0 - 36.0 g/dL   RDW 08.6  57.8 - 46.9 %   Platelets 76 (*) 150 - 400  K/uL  COMPREHENSIVE METABOLIC PANEL     Status: Abnormal   Collection Time    05/11/13  1:58 AM      Result Value Range   Sodium 135  135 - 145 mEq/L   Potassium 3.7  3.5 - 5.1 mEq/L   Chloride 99  96 - 112 mEq/L   CO2 22  19 - 32 mEq/L   Glucose, Bld 101 (*) 70 - 99 mg/dL   BUN 10  6 - 23 mg/dL   Creatinine, Ser 6.29  0.50 - 1.10 mg/dL   Calcium 52.8  8.4 - 41.3 mg/dL   Total Protein 6.5  6.0 - 8.3 g/dL   Albumin 2.9 (*) 3.5 - 5.2 g/dL   AST 15  0 - 37 U/L   ALT 13  0 - 35  U/L   Alkaline Phosphatase 132 (*) 39 - 117 U/L   Total Bilirubin 0.2 (*) 0.3 - 1.2 mg/dL   GFR calc non Af Amer >90  >90 mL/min   GFR calc Af Amer >90  >90 mL/min  ABO/RH     Status: None   Collection Time    05/11/13  1:58 AM      Result Value Range   ABO/RH(D) O NEG    URINALYSIS, ROUTINE W REFLEX MICROSCOPIC     Status: Abnormal   Collection Time    05/11/13  2:20 AM      Result Value Range   Color, Urine YELLOW  YELLOW   APPearance CLEAR  CLEAR   Specific Gravity, Urine 1.010  1.005 - 1.030   pH 6.0  5.0 - 8.0   Glucose, UA NEGATIVE  NEGATIVE mg/dL   Hgb urine dipstick TRACE (*) NEGATIVE   Bilirubin Urine NEGATIVE  NEGATIVE   Ketones, ur 15 (*) NEGATIVE mg/dL   Protein, ur NEGATIVE  NEGATIVE mg/dL   Urobilinogen, UA 0.2  0.0 - 1.0 mg/dL   Nitrite NEGATIVE  NEGATIVE   Leukocytes, UA NEGATIVE  NEGATIVE  URINE MICROSCOPIC-ADD ON     Status: None   Collection Time    05/11/13  2:20 AM      Result Value Range   Squamous Epithelial / LPF RARE  RARE   WBC, UA 0-2  <3 WBC/hpf   RBC / HPF 0-2  <3 RBC/hpf   Bacteria, UA RARE  RARE    Assessment / Plan: IUP at [redacted]w[redacted]d PPROM Delivery may not be immenent S/p single dose B-methasone Magnesium sulfate to allow completion of Bmethasone series. Appreciate Anesthesia consult Fetal Wellbeing:  Category I Thombocytopenia with increased platelet count from last check. Discrepancy between outpt and inpt blood type  Plan Continue Magnesium through 3pm  on Monday 05/12/13 or until delivery is imminent, whichever is first Continue latency antibiotics Consider transfer to antenatal unit. Discussed blood type with lab who will investigate further. Will consult pt's hematologist, Dr. Arbutus Ped, who begins call at 7am on 05/12/13.    Patricia Rivas P 05/11/2013, 11:05 AM

## 2013-05-11 NOTE — MAU Provider Note (Signed)
  History     CSN: [id number]  Arrival date and time: 05/10/13 2354   First Provider Initiated Contact with Patient 05/11/13 0025      Chief Complaint  Patient presents with  . Rupture of Membranes   HPI  Patricia Rivas is a 32 y.o. G3P0020 at [redacted]w[redacted]d who presents today with ROM. She states that her water broke around 2300, and she started having contractions soon afterward. She denies any complications with the pregnancy. She confirms fetal movement.  Past Medical History  Diagnosis Date  . Thrombocytopenia     Past Surgical History  Procedure Laterality Date  . Wisdom tooth extraction      Family History  Problem Relation Age of Onset  . Cancer Father     Prostate, colon  . Cancer Maternal Aunt     Breast    History  Substance Use Topics  . Smoking status: Former Smoker -- 1.50 packs/day for 15 years    Types: Cigarettes    Quit date: 10/16/2012  . Smokeless tobacco: Not on file  . Alcohol Use: No    Allergies: No Known Allergies  Prescriptions prior to admission  Medication Sig Dispense Refill  . Docosahexaenoic Acid (DHA OMEGA 3 PO) Take 1 capsule by mouth daily.      Marland Kitchen docusate sodium (COLACE) 100 MG capsule Take 100 mg by mouth daily.      . Prenatal Vit-Fe Fumarate-FA (MULTIVITAMIN-PRENATAL) 27-0.8 MG TABS Take 1 tablet by mouth daily.      . ranitidine (ZANTAC) 150 MG capsule Take 150 mg by mouth every evening.        ROS Physical Exam   Blood pressure 150/86, pulse 91, temperature 98.2 F (36.8 C), temperature source Oral, resp. rate 18, weight 117.482 kg (259 lb), SpO2 99.00%.  Physical Exam  Nursing note and vitals reviewed. Constitutional: She is oriented to person, place, and time. She appears well-developed and well-nourished. No distress.  Cardiovascular: Normal rate.   Respiratory: Effort normal.  GI: Soft.  Genitourinary:  External: leaking fluid seen Vagina: pooling of copious amounts of clear fluid   Neurological: She is alert  and oriented to person, place, and time.  Skin: Skin is warm and dry.  Psychiatric: She has a normal mood and affect.   FHT: 140, moderate with accels, no decels Toco: q 3-4 min x 30-40 seconds.  MAU Course  Procedures  Results for orders placed during the hospital encounter of 05/10/13 (from the past 24 hour(s))  POCT FERN TEST     Status: None   Collection Time    05/11/13 12:42 AM      Result Value Range   POCT Fern Test Positive = ruptured amniotic membanes     0047: Spoke with Dr. Pennie Rushing. She will come to see the patient and admit her.   Assessment and Plan  PPROM Admit Expectant management   Tawnya Crook 05/11/2013, 12:34 AM

## 2013-05-12 ENCOUNTER — Encounter (HOSPITAL_COMMUNITY): Payer: Self-pay | Admitting: *Deleted

## 2013-05-12 ENCOUNTER — Inpatient Hospital Stay (HOSPITAL_COMMUNITY): Payer: Managed Care, Other (non HMO) | Admitting: Anesthesiology

## 2013-05-12 ENCOUNTER — Encounter (HOSPITAL_COMMUNITY): Payer: Self-pay | Admitting: Anesthesiology

## 2013-05-12 LAB — CBC
HCT: 36.7 % (ref 36.0–46.0)
MCHC: 34.6 g/dL (ref 30.0–36.0)
MCHC: 34.2 g/dL (ref 30.0–36.0)
Platelets: 96 10*3/uL — ABNORMAL LOW (ref 150–400)
RDW: 12.8 % (ref 11.5–15.5)
RDW: 12.8 % (ref 11.5–15.5)
WBC: 16.7 10*3/uL — ABNORMAL HIGH (ref 4.0–10.5)

## 2013-05-12 LAB — MAGNESIUM: Magnesium: 4.8 mg/dL — ABNORMAL HIGH (ref 1.5–2.5)

## 2013-05-12 MED ORDER — ACETAMINOPHEN 325 MG PO TABS: 650.0000 mg | ORAL_TABLET | ORAL | Status: DC | PRN

## 2013-05-12 MED ORDER — PHENYLEPHRINE 40 MCG/ML (10ML) SYRINGE FOR IV PUSH (FOR BLOOD PRESSURE SUPPORT)
80.0000 ug | PREFILLED_SYRINGE | INTRAVENOUS | Status: DC | PRN
  Filled 2013-05-12: qty 2

## 2013-05-12 MED ORDER — BENZOCAINE-MENTHOL 20-0.5 % EX AERO: 1.0000 "application " | INHALATION_SPRAY | CUTANEOUS | Status: DC | PRN

## 2013-05-12 MED ORDER — OXYCODONE-ACETAMINOPHEN 5-325 MG PO TABS: 1.0000 | ORAL_TABLET | ORAL | Status: DC | PRN

## 2013-05-12 MED ORDER — OXYTOCIN 40 UNITS IN LACTATED RINGERS INFUSION - SIMPLE MED
62.5000 mL/h | INTRAVENOUS | Status: DC
  Administered 2013-05-12: 999 mL/h via INTRAVENOUS
  Filled 2013-05-12: qty 1000

## 2013-05-12 MED ORDER — LIDOCAINE HCL (PF) 1 % IJ SOLN
INTRAMUSCULAR | Status: DC | PRN
  Administered 2013-05-12 (×2): 4 mL

## 2013-05-12 MED ORDER — DIBUCAINE 1 % RE OINT: 1.0000 "application " | TOPICAL_OINTMENT | RECTAL | Status: DC | PRN

## 2013-05-12 MED ORDER — OXYTOCIN 40 UNITS IN LACTATED RINGERS INFUSION - SIMPLE MED
1.0000 m[IU]/min | INTRAVENOUS | Status: DC
  Administered 2013-05-12: 1 m[IU]/min via INTRAVENOUS

## 2013-05-12 MED ORDER — LIDOCAINE HCL (PF) 1 % IJ SOLN
30.0000 mL | INTRAMUSCULAR | Status: DC | PRN
  Filled 2013-05-12: qty 30

## 2013-05-12 MED ORDER — LACTATED RINGERS IV SOLN
INTRAVENOUS | Status: DC
  Administered 2013-05-12: 09:00:00 via INTRAVENOUS

## 2013-05-12 MED ORDER — IBUPROFEN 600 MG PO TABS: 600.0000 mg | ORAL_TABLET | Freq: Four times a day (QID) | ORAL | Status: DC | PRN

## 2013-05-12 MED ORDER — LANOLIN HYDROUS EX OINT: TOPICAL_OINTMENT | CUTANEOUS | Status: DC | PRN

## 2013-05-12 MED ORDER — ONDANSETRON HCL 4 MG PO TABS: 4.0000 mg | ORAL_TABLET | ORAL | Status: DC | PRN

## 2013-05-12 MED ORDER — TETANUS-DIPHTH-ACELL PERTUSSIS 5-2.5-18.5 LF-MCG/0.5 IM SUSP
0.5000 mL | Freq: Once | INTRAMUSCULAR | Status: AC
  Administered 2013-05-13: 0.5 mL via INTRAMUSCULAR

## 2013-05-12 MED ORDER — TERBUTALINE SULFATE 1 MG/ML IJ SOLN: 0.2500 mg | Freq: Once | INTRAMUSCULAR | Status: DC | PRN

## 2013-05-12 MED ORDER — PHENYLEPHRINE 40 MCG/ML (10ML) SYRINGE FOR IV PUSH (FOR BLOOD PRESSURE SUPPORT)
80.0000 ug | PREFILLED_SYRINGE | INTRAVENOUS | Status: DC | PRN
  Filled 2013-05-12: qty 5
  Filled 2013-05-12: qty 2

## 2013-05-12 MED ORDER — ZOLPIDEM TARTRATE 5 MG PO TABS: 5.0000 mg | ORAL_TABLET | Freq: Every evening | ORAL | Status: DC | PRN

## 2013-05-12 MED ORDER — ONDANSETRON HCL 4 MG/2ML IJ SOLN: 4.0000 mg | Freq: Four times a day (QID) | INTRAMUSCULAR | Status: DC | PRN

## 2013-05-12 MED ORDER — FENTANYL 2.5 MCG/ML BUPIVACAINE 1/10 % EPIDURAL INFUSION (WH - ANES)
14.0000 mL/h | INTRAMUSCULAR | Status: DC | PRN
  Administered 2013-05-12: 14 mL/h via EPIDURAL
  Filled 2013-05-12 (×2): qty 125

## 2013-05-12 MED ORDER — PRENATAL MULTIVITAMIN CH
1.0000 | ORAL_TABLET | Freq: Every day | ORAL | Status: DC
  Administered 2013-05-13: 1 via ORAL
  Filled 2013-05-12 (×2): qty 1

## 2013-05-12 MED ORDER — METHYLERGONOVINE MALEATE 0.2 MG PO TABS: 0.2000 mg | ORAL_TABLET | ORAL | Status: DC | PRN

## 2013-05-12 MED ORDER — IBUPROFEN 600 MG PO TABS
600.0000 mg | ORAL_TABLET | Freq: Four times a day (QID) | ORAL | Status: DC
  Administered 2013-05-12 – 2013-05-14 (×6): 600 mg via ORAL
  Filled 2013-05-12 (×7): qty 1

## 2013-05-12 MED ORDER — FENTANYL 2.5 MCG/ML BUPIVACAINE 1/10 % EPIDURAL INFUSION (WH - ANES)
INTRAMUSCULAR | Status: DC | PRN
  Administered 2013-05-12: 14 mL/h via EPIDURAL

## 2013-05-12 MED ORDER — WITCH HAZEL-GLYCERIN EX PADS: 1.0000 "application " | MEDICATED_PAD | CUTANEOUS | Status: DC | PRN

## 2013-05-12 MED ORDER — METHYLERGONOVINE MALEATE 0.2 MG/ML IJ SOLN: 0.2000 mg | INTRAMUSCULAR | Status: DC | PRN

## 2013-05-12 MED ORDER — CITRIC ACID-SODIUM CITRATE 334-500 MG/5ML PO SOLN: 30.0000 mL | ORAL | Status: DC | PRN

## 2013-05-12 MED ORDER — EPHEDRINE 5 MG/ML INJ
10.0000 mg | INTRAVENOUS | Status: DC | PRN
  Filled 2013-05-12: qty 2

## 2013-05-12 MED ORDER — LACTATED RINGERS IV SOLN: 500.0000 mL | Freq: Once | INTRAVENOUS | Status: DC

## 2013-05-12 MED ORDER — OXYTOCIN BOLUS FROM INFUSION: 500.0000 mL | INTRAVENOUS | Status: DC

## 2013-05-12 MED ORDER — ONDANSETRON HCL 4 MG/2ML IJ SOLN: 4.0000 mg | INTRAMUSCULAR | Status: DC | PRN

## 2013-05-12 MED ORDER — SIMETHICONE 80 MG PO CHEW: 80.0000 mg | CHEWABLE_TABLET | ORAL | Status: DC | PRN

## 2013-05-12 MED ORDER — SENNOSIDES-DOCUSATE SODIUM 8.6-50 MG PO TABS
2.0000 | ORAL_TABLET | Freq: Every day | ORAL | Status: DC
  Administered 2013-05-12 – 2013-05-13 (×2): 2 via ORAL

## 2013-05-12 MED ORDER — BUTORPHANOL TARTRATE 1 MG/ML IJ SOLN: 1.0000 mg | INTRAMUSCULAR | Status: DC | PRN

## 2013-05-12 MED ORDER — LACTATED RINGERS IV SOLN
500.0000 mL | INTRAVENOUS | Status: DC | PRN
  Administered 2013-05-12: 1000 mL via INTRAVENOUS

## 2013-05-12 MED ORDER — EPHEDRINE 5 MG/ML INJ
10.0000 mg | INTRAVENOUS | Status: DC | PRN
  Filled 2013-05-12: qty 2
  Filled 2013-05-12: qty 4

## 2013-05-12 MED ORDER — DIPHENHYDRAMINE HCL 50 MG/ML IJ SOLN: 12.5000 mg | INTRAMUSCULAR | Status: DC | PRN

## 2013-05-12 MED ORDER — DIPHENHYDRAMINE HCL 25 MG PO CAPS: 25.0000 mg | ORAL_CAPSULE | Freq: Four times a day (QID) | ORAL | Status: DC | PRN

## 2013-05-12 NOTE — Progress Notes (Signed)
Patricia Rivas is a 32 y.o. G3P0020 at [redacted]w[redacted]d by LMP admitted for PROM  Subjective:  Feeling more pressure   Objective: BP 123/59  Pulse 78  Temp(Src) 99.2 F (37.3 C) (Oral)  Resp 18  Ht 5\' 5"  (1.651 m)  Wt 117.482 kg (259 lb)  BMI 43.1 kg/m2  SpO2 100% I/O last 3 completed shifts: In: 5961.7 [P.O.:2040; I.V.:3671.7; IV Piggyback:250] Out: 6220 [Urine:6220] Total I/O In: 365 [P.O.:240; I.V.:125] Out: 2550 [Urine:2550]  FHT:  FHR: 130 bpm, variability: moderate,  accelerations:  Present,  decelerations:  Absent UC:   regular, every 4 minutes SVE:   Dilation: 9 Effacement (%): 80 Station: +2 Exam by:: Dr Richardson Dopp  Labs: Lab Results  Component Value Date   WBC 14.6* 05/12/2013   HGB 12.7 05/12/2013   HCT 36.7 05/12/2013   MCV 82.7 05/12/2013   PLT 91* 05/12/2013    Assessment / Plan: Augmentation of labor, progressing well  Labor: Progressing normally Preeclampsia:  NA Fetal Wellbeing:  Category I Pain Control:  Epidural I/D:  n/a Anticipated MOD:  NSVD  Patricia Rivas J. 05/12/2013, 6:03 PM

## 2013-05-12 NOTE — Anesthesia Preprocedure Evaluation (Signed)
Anesthesia Evaluation  Patient identified by MRN, date of birth, ID band Patient awake    Reviewed: Allergy & Precautions, H&P , NPO status , Patient's Chart, lab work & pertinent test results  Airway Mallampati: III TM Distance: >3 FB Neck ROM: Full    Dental no notable dental hx. (+) Teeth Intact   Pulmonary former smoker,  breath sounds clear to auscultation  Pulmonary exam normal       Cardiovascular negative cardio ROS  Rhythm:Regular Rate:Normal     Neuro/Psych PSYCHIATRIC DISORDERS negative neurological ROS     GI/Hepatic Neg liver ROS, GERD-  Medicated and Controlled,  Endo/Other  Morbid obesity  Renal/GU negative Renal ROS  negative genitourinary   Musculoskeletal negative musculoskeletal ROS (+)   Abdominal (+) + obese,   Peds  Hematology Hx/o ITP since age 32. On no medications. No hx/o bleeding tendencies.Has been seeing heme/onc and they had planned po steroids after 34 weeks. Last platelet count 63k.    Anesthesia Other Findings   Reproductive/Obstetrics 33weeks 5 days SROM early labor On Magnesium Sulfate She has gotten 1 dose of Betamethasone                            Anesthesia Physical  Anesthesia Plan  ASA: III  Anesthesia Plan:    Post-op Pain Management:    Induction:   Airway Management Planned:   Additional Equipment:   Intra-op Plan:   Post-operative Plan:   Informed Consent:   Plan Discussed with: Anesthesiologist  Anesthesia Plan Comments: (Discussed regional anesthesia in detail with patient and her husband. Depending on when she goes into active labor and what her platelet count is at that time will determine whether or not we can safely place an epidural catheter. I would recommend we get hematology involved in her care to see if they had any recommendations concerning steroids, dDAVP administration or other methods to increase her platelet  count.)        Anesthesia Quick Evaluation

## 2013-05-12 NOTE — Progress Notes (Signed)
Patricia Rivas is a 32 y.o. G3P0020 at [redacted]w[redacted]d by LMP admitted for PROM,  And thrombocytopenia   Subjective: Pain controlled with epidural   Objective: BP 109/48  Pulse 80  Temp(Src) 99.2 F (37.3 C) (Oral)  Resp 18  Ht 5\' 5"  (1.651 m)  Wt 117.482 kg (259 lb)  BMI 43.1 kg/m2  SpO2 100% I/O last 3 completed shifts: In: 5961.7 [P.O.:2040; I.V.:3671.7; IV Piggyback:250] Out: 6220 [Urine:6220] Total I/O In: 365 [P.O.:240; I.V.:125] Out: 450 [Urine:450]  FHT:  FHR: 120 bpm, variability: moderate,  accelerations:  Present,  decelerations:  Absent UC:   irregular, every 6-10  minutes SVE:   Dilation: 7 Effacement (%): 100 Station: 0 Exam by:: Ferne Coe RN  Labs: Lab Results  Component Value Date   WBC 14.6* 05/12/2013   HGB 12.7 05/12/2013   HCT 36.7 05/12/2013   MCV 82.7 05/12/2013   PLT 91* 05/12/2013    Assessment / Plan: 33 wks and 6 days with PPROM.Marland KitchenMarland Kitchen Arrest of dilation.. Start pitocin for augmentation IUPC placed at 1300   Labor: pitocin low dose  Preeclampsia:  NA Fetal Wellbeing:  Category I Pain Control:  Epidural I/D:  n/a Anticipated MOD:  NSVD  Juanmiguel Defelice J. 05/12/2013, 3:02 PM

## 2013-05-12 NOTE — Anesthesia Procedure Notes (Signed)
Epidural Patient location during procedure: OB Start time: 05/12/2013 9:00 AM  Staffing Anesthesiologist: Maryella Abood A. Performed by: anesthesiologist   Preanesthetic Checklist Completed: patient identified, site marked, surgical consent, pre-op evaluation, timeout performed, IV checked, risks and benefits discussed and monitors and equipment checked  Epidural Patient position: sitting Prep: site prepped and draped and DuraPrep Patient monitoring: continuous pulse ox and blood pressure Approach: midline Injection technique: LOR air  Needle:  Needle type: Tuohy  Needle gauge: 17 G Needle length: 9 cm and 9 Needle insertion depth: 6 cm Catheter type: closed end flexible Catheter size: 19 Gauge Catheter at skin depth: 11 cm Test dose: negative and Other  Assessment Events: blood not aspirated, injection not painful, no injection resistance, negative IV test and no paresthesia  Additional Notes Patient identified. Risks and benefits discussed including failed block, incomplete  Pain control, post dural puncture headache, nerve damage, paralysis, blood pressure Changes, nausea, vomiting, reactions to medications-both toxic and allergic and post Partum back pain. All questions were answered. Patient expressed understanding and wished to proceed. Sterile technique was used throughout procedure. Epidural site was Dressed with sterile barrier dressing. No paresthesias, signs of intravascular injection Or signs of intrathecal spread were encountered.  Patient was more comfortable after the epidural was dosed. Please see RN's note for documentation of vital signs and FHR which are stable. Will recheck platelet count after delivery prior to removing epidural catheter. Discussed with RN and patient.

## 2013-05-12 NOTE — Progress Notes (Signed)
Patricia Rivas is a 32 y.o. G3P0020 at [redacted]w[redacted]d by LMP admitted for PROM  Subjective:  patient reports strong contractions every 10 minutes .Marland Kitchen She would like to have an epidural .. Leakage of clear fluid + FM  Objective: BP 137/78  Pulse 101  Temp(Src) 98.8 F (37.1 C) (Axillary)  Resp 16  Ht 5\' 5"  (1.651 m)  Wt 117.482 kg (259 lb)  BMI 43.1 kg/m2  SpO2 100% I/O last 3 completed shifts: In: 5961.7 [P.O.:2040; I.V.:3671.7; IV Piggyback:250] Out: 6220 [Urine:6220]    FHT:  FHR: 130 bpm, variability: moderate,  accelerations:  Present,  decelerations:  Absent UC:    Not graphing well  SVE:   4/80/-1   Labs: Lab Results  Component Value Date   WBC 14.6* 05/12/2013   HGB 12.7 05/12/2013   HCT 36.7 05/12/2013   MCV 82.7 05/12/2013   PLT 91* 05/12/2013    Assessment / Plan: Spontaneous labor, progressing normally will tranfer to labor and delivery.. D/c magnesium and ampicillin as pt is GBS negative   Labor: Progressing normally Preeclampsia:  no signs or symptoms of toxicity Fetal Wellbeing:  Category I Pain Control:  pt desires an epidural  I/D:  n/a Anticipated MOD:  NSVD  Mesa Janus J. 05/12/2013, 8:23 AM

## 2013-05-12 NOTE — Progress Notes (Signed)
Hospital day # 2 pregnancy at [redacted]w[redacted]d--PPROM on 05/10/13.  S:  Requested by Dr. Pennie Rushing to evaluate patient due to increased contractions this am, with patient breathing with UCs.  Passed small clot, but no active bleeding.  +FM.  Denies abdominal pain other than contractions.   O: BP 122/74  Pulse 96  Temp(Src) 98.2 F (36.8 C) (Oral)  Resp 20  Ht 5\' 5"  (1.651 m)  Wt 259 lb (117.482 kg)  BMI 43.1 kg/m2  SpO2 100%      Fetal tracings:  Category 1      Contractions: q 10 min, mild/mod.        Uterus :  NT to palpation      Extremities: extremities normal, atraumatic, no cyanosis or edema and no significant edema and no signs of DVT      Leaking clear fluid       Cervix loose 2 cm, 100%, vtx, 0 station--vtx presentation verified by BS [name].                Labs:   Results for orders placed during the hospital encounter of 05/10/13 (from the past 24 hour(s))  MAGNESIUM     Status: Abnormal   Collection Time    05/12/13  5:50 AM      Result Value Range   Magnesium 4.8 (*) 1.5 - 2.5 mg/dL  CBC     Status: Abnormal   Collection Time    05/12/13  5:50 AM      Result Value Range   WBC 14.6 (*) 4.0 - 10.5 K/uL   RBC 4.44  3.87 - 5.11 MIL/uL   Hemoglobin 12.7  12.0 - 15.0 g/dL   HCT 14.7  82.9 - 56.2 %   MCV 82.7  78.0 - 100.0 fL   MCH 28.6  26.0 - 34.0 pg   MCHC 34.6  30.0 - 36.0 g/dL   RDW 13.0  86.5 - 78.4 %   Platelets 91 (*) 150 - 400 K/uL   GC, chlamydia negative from 05/11/13 GBS negative from 05/11/13       Meds:  Received 2 doses of betamethasone--LD at 3:19pm on 05/11/13   Currently on magnesium for latency after betamethasone course.   Received Zithromax 1 gm on 05/11/13;  Ampicillin 2 gm x 5 doses (LD 3:11am today)      A: [redacted]w[redacted]d with PPROM x approx 33 hours     ITP, with improved platelet count today     Probable early labor     GBS negative      P: Dr. Pennie Rushing updated on status and results.  She will update Dr. Richardson Dopp this am.     Further plans of care per Dr. Richardson Dopp.           Nigel Bridgeman CNM, [place] 05/12/2013 7:15 AM

## 2013-05-13 LAB — CBC
HCT: 34 % — ABNORMAL LOW (ref 36.0–46.0)
RBC: 4.07 MIL/uL (ref 3.87–5.11)
RDW: 12.9 % (ref 11.5–15.5)
WBC: 15.3 10*3/uL — ABNORMAL HIGH (ref 4.0–10.5)

## 2013-05-13 LAB — KLEIHAUER-BETKE STAIN
# Vials RhIg: 1
Quantitation Fetal Hemoglobin: 0 mL

## 2013-05-13 MED ORDER — RHO D IMMUNE GLOBULIN 1500 UNIT/2ML IJ SOLN
300.0000 ug | Freq: Once | INTRAMUSCULAR | Status: AC
  Administered 2013-05-13: 300 ug via INTRAMUSCULAR
  Filled 2013-05-13: qty 2

## 2013-05-13 NOTE — Anesthesia Postprocedure Evaluation (Signed)
Anesthesia Post Note  Patient: Patricia Rivas  Procedure(s) Performed: * No procedures listed *  Anesthesia type: Epidural  Patient location: Mother/Baby  Post pain: Pain level controlled  Post assessment: Post-op Vital signs reviewed  Last Vitals:  Filed Vitals:   05/13/13 1200  BP: 97/66  Pulse: 74  Temp: 36.3 C  Resp: 18    Post vital signs: Reviewed  Level of consciousness:alert  Complications: No apparent anesthesia complications

## 2013-05-13 NOTE — Progress Notes (Signed)
Post Partum Day 1 Subjective: no complaints, up ad lib, voiding and tolerating PO  Objective: Blood pressure 97/66, pulse 74, temperature 97.3 F (36.3 C), temperature source Oral, resp. rate 18, height 5\' 5"  (1.651 m), weight 117.482 kg (259 lb), SpO2 97.00%, unknown if currently breastfeeding.  Physical Exam:  General: alert and cooperative Lochia: appropriate Uterine Fundus: firm Incision: NA DVT Evaluation: No evidence of DVT seen on physical exam.   Recent Labs  05/12/13 1935 05/13/13 0505  HGB 12.0 11.4*  HCT 35.1* 34.0*    Assessment/Plan: Plan for discharge tomorrow and Breastfeeding   LOS: 3 days   Patricia Rivas J. 05/13/2013, 1:43 PM

## 2013-05-14 LAB — RH IG WORKUP (INCLUDES ABO/RH)
ABO/RH(D): O NEG
Fetal Screen: NEGATIVE
Unit division: 0

## 2013-05-14 MED ORDER — IBUPROFEN 600 MG PO TABS: 600.0000 mg | ORAL_TABLET | Freq: Four times a day (QID) | ORAL | Status: DC | PRN

## 2013-05-14 NOTE — Progress Notes (Signed)
Pt is discharged in the care of husband. Downstairs per ambulatory. Stable.Denies any excessive vaginal bleeding. Denies any pain or discomfort.Discharged instructions with RX were given to pt. Comprehened all instructions well. Questions  Asked and answered.Stable.Infant to remain in nicu,

## 2013-05-14 NOTE — Discharge Summary (Signed)
Obstetric Discharge Summary Reason for Admission: rupture of membranes Prenatal Procedures: ultrasound and steroids for fetal lung maturity  Intrapartum Procedures: spontaneous vaginal delivery Postpartum Procedures: none Complications-Operative and Postpartum: first  degree perineal laceration HGB  Date Value Range Status  04/07/2013 13.0  11.6 - 15.9 g/dL Final     Hemoglobin  Date Value Range Status  05/13/2013 11.4* 12.0 - 15.0 g/dL Final     HCT  Date Value Range Status  05/13/2013 34.0* 36.0 - 46.0 % Final  04/07/2013 37.9  34.8 - 46.6 % Final    Physical Exam:  General: alert and cooperative Lochia: appropriate Uterine Fundus: firm Incision: NA DVT Evaluation: No evidence of DVT seen on physical exam.  Discharge Diagnoses: Term Pregnancy-delivered  Discharge Information: Date: 05/14/2013 Activity: pelvic rest Diet: routine Medications: PNV and Ibuprofen Condition: stable Instructions: refer to practice specific booklet Discharge to: home Follow-up Information   Follow up with Jessee Avers., MD. Schedule an appointment as soon as possible for a visit in 2 weeks. (postpartum follow up to evaluate for depression )    Contact information:   [address] [place] [address] [phone]       Newborn Data: Live born female  Birth Weight: 4 lb 13.6 oz (2200 g) APGAR: 8, 9  Baby in NICU progressing well   Caden Fukushima J. 05/14/2013, 1:00 PM

## 2013-05-15 ENCOUNTER — Telehealth: Payer: Self-pay | Admitting: Internal Medicine

## 2013-05-15 ENCOUNTER — Ambulatory Visit: Payer: Self-pay

## 2013-05-15 NOTE — Progress Notes (Signed)
Clinical Social Work Department PSYCHOSOCIAL ASSESSMENT - MATERNAL/CHILD 05/15/2013  Patient:  Patricia Rivas, Patricia Rivas  Account Number:  [id number]  Admit Date:  05/10/2013  Marjo Bicker Name:   Blenda Nicely Kayes    Clinical Social Worker:  Lulu Riding, [place]   Date/Time:  05/14/2013 02:00 PM  Date Referred:  05/14/2013      Referred reason  NICU  Behavioral Health Issues   Other referral source:   No referral-NICU admission/chart review    I:  FAMILY / HOME ENVIRONMENT Child's legal guardian:  PARENT  Guardian - Name Guardian - Age Guardian - Address  Shaquita Fort [address] [place], [place] [address]  Novella Olive  same   Other household support members/support persons Other support:   Randie Heinz support from family and friends-all near by.    II  PSYCHOSOCIAL DATA Information Source:  Family Interview  Insurance claims handler Resources Employment:   Theatre manager Lab  [employer] Abuse Therapist with Data processing manager resources:  Media planner If [employer] - [place]:    School / Grade:  [employer]-Masters in Rehabilitation Counseling-[employer] social research officer / Statistician / Early Interventions:  Cultural issues impacting care:   none stated    III  STRENGTHS Strengths  Adequate Resources  Compliance with medical plan  Home prepared for Child (including basic supplies)  Supportive family/friends  Understanding of illness   Strength comment:    IV  RISK FACTORS AND CURRENT PROBLEMS Current Problem:  YES   Risk Factor & Current Problem Patient Issue Family Issue Risk Factor / Current Problem Comment  Mental Illness Y N MOB-Depression   N N     V  SOCIAL WORK ASSESSMENT  CSW met with parents in MOB's third floor room/306 to introduce myself and complete assessment due to NICU admission.  CSW also notes that MOB's chart states that she discontinued use of Celexa, Lamictal and Neurontin in  1/14, suggesting a mental health dx.  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  Parents were extremely friendly, welcoming of CSW and talkative.  They report coping well at this time although acknowledge that they are sad to leave baby in the hospital.  They seem to have a good understanding of the situation and want the best for baby.  They state they have a great support system and that all preparations for baby are complete at home.  MOB thinks she will be able to work from home until returning to work around Thanksgiving as originally planned.  FOB states his schedule is flexible as well.  They state they live near the hospital and has no concerns regarding transportation back and forth.  CSW validated their feelings of sadness regarding discharge today and discussed signs and symptoms of PPD.  MOB states she has a hx of depression and felt it would be beneficial to restart an antidepressant at this time, even though she is not currently concerned about her emotional state.  CSW agrees that this is a wise decision given her hx and the situation.  She states she has a Paramedic at [facility]/Dr. Eliott Nine, whom she can call at any time.  FOB added (jokingly) that he is a trained crisis Water engineer and will be looking out for MOB.  CSW agreed that he will be a good support for MOB, but to monitor his own feelings as well and try to focus on his new role  of being a dad.  He agreed.  MOB also reports being a recovering alcoholic and that she has been sober for 4 years.  CSW commended her for this.  Parents seem very supportive of each other and happy about the baby.  CSW has no social concerns at this time and feels confident that MOB will watch for signs and symptoms of PPD and address them if they arise.  CSW explained ongoing support services offered by NICU CSW and encouraged them to contact CSW at any time during baby's hospitalization.  They  seemed very appreciative of CSW's visit.   VI SOCIAL WORK PLAN Social Work Plan  Psychosocial Support/Ongoing Assessment of Needs   Type of pt/family education:   PPD signs and symptoms  Ongoing support services offered by NICU CSW   If child protective services report - county:   If child protective services report - date:   Information/referral to community resources comment:   No referral needs identified at this time.   Other social work plan:

## 2013-05-15 NOTE — Telephone Encounter (Signed)
had baby and wanted to cancel appt did not want to r/s

## 2013-05-15 NOTE — Lactation Note (Signed)
This note was copied from the chart of [name]. Lactation Consultation Note   Follow up consult with this mom of a NICU baby, 34 1/7 weeks corrected gestation, and  36 hours post partum. I assisted mom latching her baby to breast for the first time, during  An ng feed. i also showed mom how to hand express colostrum and the baby was able to latch and swallow small drops. This made mom very happy. Mom later told me that she was hand expressing and getting  Very good results. Mom was also surprise and happy to feel her baby suckle at her breast- Julieanne Cotton is a very vigorous baby. i will follow this family in the NICU. I alos gave mom a set of 27 flanges later in the, since she had left her's at home and needed to pump while visiting.-  Patient Name: [name] ZOXWR'U Date: 05/15/2013     Maternal Data    Feeding Feeding Type: Formula Nipple Type: Slow - flow Length of feed: 15 min  LATCH Score/Interventions                      Lactation Tools Discussed/Used     Consult Status      Patricia Rivas 05/15/2013, 10:06 AM

## 2013-05-16 ENCOUNTER — Other Ambulatory Visit: Payer: Managed Care, Other (non HMO)

## 2013-05-19 ENCOUNTER — Ambulatory Visit: Payer: Managed Care, Other (non HMO) | Admitting: Internal Medicine

## 2013-05-23 ENCOUNTER — Ambulatory Visit: Payer: Self-pay

## 2013-05-23 NOTE — Lactation Note (Signed)
This note was copied from the chart of Patricia Melisssa Coster. Lactation Consultation Note   Follow up consult with this mom and baby, now 63 days old, and 35 3/[redacted] weeks gestation corrected. Mom has been breast feeding with ng feeds  Every day since birth. I did a pre and post weight today, and she transferred zero. I told mom this is normal, for both her size and gestation. Late preterm baby's and breast feeding reviewed. Patricia Rivas still has an ng tube, but is doing with taking bottles.,  I will follow this family in the NICU.  Patient Name: Patricia Rivas ZOXWR'U Date: 05/23/2013 Reason for consult: Follow-up assessment;NICU baby   Maternal Data    Feeding Feeding Type: Breast Milk Nipple Type: Slow - flow Length of feed: 30 min  LATCH Score/Interventions Latch: Repeated attempts needed to sustain latch, nipple held in mouth throughout feeding, stimulation needed to elicit sucking reflex. Intervention(s): Skin to skin;Teach feeding cues;Waking techniques Intervention(s): Adjust position;Assist with latch;Breast massage;Breast compression  Audible Swallowing: None  Type of Nipple: Everted at rest and after stimulation  Comfort (Breast/Nipple): Soft / non-tender     Hold (Positioning): Assistance needed to correctly position infant at breast and maintain latch. Intervention(s): Breastfeeding basics reviewed;Support Pillows;Position options;Skin to skin  LATCH Score: 6  Lactation Tools Discussed/Used     Consult Status Consult Status: PRN Follow-up type:  (in NICU)    Alfred Levins 05/23/2013, 6:27 PM

## 2013-05-27 ENCOUNTER — Ambulatory Visit: Payer: Self-pay

## 2013-05-27 NOTE — Lactation Note (Addendum)
This note was copied from the chart of [name]. Lactation Consultation Note    Follow up consult with this mom and baby, in the NICU. Mom was breast feeding baby when I walked into the bed space. I helped mom to reposition both herself and baby, to hold baby's back as opposed to her head, and to have baby nipple to nose and deeply latch. Mom could feel the deeper latch obtained by doing this. Mom has some mild knots of milk in her right breast. I reheated her breast heating pad for her, and suggested she try using her hands free bra and/or pumping one breast at a time, to massage out the knots while pumping. Mom reports some mild tenderness, but no signs and symptoms of infection. i will follow this family in NICU, and mom has the number to call for outpatient lactation, for after baby's discharge to home.  Patient Name: Patricia Rivas RUEAV'W Date: 05/27/2013 Reason for consult: Follow-up assessment;NICU baby   Maternal Data    Feeding Feeding Type: Breast Milk Nipple Type: Slow - flow Length of feed: 15 min  LATCH Score/Interventions Latch: Grasps breast easily, tongue down, lips flanged, rhythmical sucking. Intervention(s): Adjust position;Assist with latch  Audible Swallowing: A few with stimulation  Type of Nipple: Everted at rest and after stimulation  Comfort (Breast/Nipple): Filling, red/small blisters or bruises, mild/mod discomfort  Problem noted: Mild/Moderate discomfort Interventions (Mild/moderate discomfort): Hand massage  Hold (Positioning): Assistance needed to correctly position infant at breast and maintain latch. Intervention(s): Position options;Support Pillows;Breastfeeding basics reviewed  LATCH Score: 7  Lactation Tools Discussed/Used Tools: Pump;Other (comment) (heat, massage, and intermittently ice. small knots of milk)   Consult Status Consult Status: PRN Follow-up type:  (in NICU)    Alfred Levins 05/27/2013, 9:33 AM

## 2013-06-18 ENCOUNTER — Ambulatory Visit: Payer: Self-pay

## 2013-06-18 NOTE — Lactation Note (Signed)
This note was copied from the chart of Patricia Rivas. Infant Lactation Consultation Outpatient Visit Note  Patient Name: Patricia Rivas Date of Birth: 05/12/2013 Birth Weight:  4 lb 13.6 oz (2200 g)              Weight at pediatrician's  On 05/29/13 was 5 lbs 3 oz      Weight today is 6 lbs 14.9    She gained 28 ounces in last 20 days Gestational Age at Delivery: Gestational Age: [redacted]w[redacted]d Type of Delivery:   Breastfeeding History Frequency of Breastfeeding: 2-3 times a day Length of Feeding: 10-15 minutes Voids: 12 Stools: 12  Supplementing / Method: Pumping:  Type of Pump:Medela DEP   Frequency:every 3 hours, 8 times a day  Volume:  3-4 ounces at a time  Comments:   Bottle feeding EBM 95-115 mls on demand, about 6-8 times a day    Consultation Evaluation:    Patricia Rivas is here with mom today to assess breast feeding. She was premature, and is now term. She is gaining weight very well.  She is mostly bottle feeding EBM at home, with about 2 breast feeds a day. She was able to transfer 24 mls today at the breast.. She starts out deep on breast, but end up pinching mom's nipple at the end of feed. Mom plans to increase her times of breast feeding at home to 3-4 times a day, limiting her breast feeding time to 30 minutes,  and still offer EBM by bottle after. Mom will return next week for a follow up consult.  Initial Feeding Assessment: Pre-feed Weight:3160 Post-feed [id number]:[id number] Amount Transferred:14 Comments: Patricia spit up through her nose, and was upset, and was very tired after this  Additional Feeding Assessment: Pre-feed GEXBMW:4132 Post-feed GMWNUU:7253 Amount Transferred:10 Comments:  Additional Feeding Assessment: Pre-feed Weight: Post-feed Weight: Amount Transferred: Comments:  Total Breast milk Transferred this Visit: 24 Total Supplement Given: mom to feed EBM by bottle home  Additional Interventions:   Follow-Up   Mom to come back on Tuesday,  06/24/13 at 1200 noon      Alfred Levins 06/18/2013, 1:03 PM

## 2013-08-28 ENCOUNTER — Other Ambulatory Visit: Payer: Self-pay

## 2013-09-01 ENCOUNTER — Other Ambulatory Visit: Payer: Self-pay | Admitting: Obstetrics and Gynecology

## 2013-09-01 ENCOUNTER — Other Ambulatory Visit (HOSPITAL_COMMUNITY)
Admission: RE | Admit: 2013-09-01 | Discharge: 2013-09-01 | Disposition: A | Discharge status: Home / Self Care | Payer: Managed Care, Other (non HMO) | Source: Ambulatory Visit | Attending: Obstetrics and Gynecology | Admitting: Obstetrics and Gynecology

## 2013-09-01 DIAGNOSIS — Z1151 Encounter for screening for human papillomavirus (HPV): Secondary | ICD-10-CM | POA: Insufficient documentation

## 2013-09-01 DIAGNOSIS — Z01419 Encounter for gynecological examination (general) (routine) without abnormal findings: Secondary | ICD-10-CM | POA: Insufficient documentation

## 2014-08-24 ENCOUNTER — Encounter (HOSPITAL_COMMUNITY): Payer: Self-pay | Admitting: *Deleted

## 2014-09-01 ENCOUNTER — Other Ambulatory Visit: Payer: Self-pay | Admitting: Obstetrics and Gynecology

## 2014-09-01 ENCOUNTER — Other Ambulatory Visit (HOSPITAL_COMMUNITY)
Admission: RE | Admit: 2014-09-01 | Discharge: 2014-09-01 | Disposition: A | Discharge status: Home / Self Care | Payer: Managed Care, Other (non HMO) | Source: Ambulatory Visit | Attending: Obstetrics and Gynecology | Admitting: Obstetrics and Gynecology

## 2014-09-01 DIAGNOSIS — Z01419 Encounter for gynecological examination (general) (routine) without abnormal findings: Secondary | ICD-10-CM | POA: Diagnosis present

## 2014-09-04 LAB — CYTOLOGY - PAP

## 2014-09-15 ENCOUNTER — Encounter: Payer: Self-pay | Admitting: Pulmonary Disease

## 2014-09-15 ENCOUNTER — Ambulatory Visit (INDEPENDENT_AMBULATORY_CARE_PROVIDER_SITE_OTHER): Payer: Managed Care, Other (non HMO) | Admitting: Pulmonary Disease

## 2014-09-15 VITALS — BP 106/66 | HR 92 | Temp 98.4°F | Ht 65.0 in | Wt 225.0 lb

## 2014-09-15 DIAGNOSIS — R0683 Snoring: Secondary | ICD-10-CM

## 2014-09-15 NOTE — Progress Notes (Signed)
Chief Complaint  Patient presents with  . Advice Only    pt here for sleep consult: patient says her spouse is worried she has sleep apnea. She snores while sleeping and at times stops breathing per spouse. She has a family hx of sleep apnea.    History of Present Illness: Patricia Rivas is a 33 y.o. female for evaluation of sleep problems.  Her husband has been worried about her snoring.  He has also told her that she stops breathing while asleep.  Her husband sleeps in a separate room because her snoring is so loud.  Her father and aunt have sleep apnea, and use CPAP.  She has teeth clenching and is supposed to use a bite guard, but this is not very comfortable.  She will wake up hearing herself gasp for air.  She sleeps on her back.  She listens too music to help her sleep.  She goes to sleep at 10 pm.  She falls asleep after 10 minutes.  She wakes up 1 or 2 times during the night, but then falls back to sleep.  She gets out of bed at 630 am.  She feels tired in the morning.  She denies morning headache.  She does not use anything to help her fall asleep.  She drinks 3 cups of coffee per day.  She denies sleep walking, sleep talking, bruxism, or nightmares.  There is no history of restless legs.  She denies sleep hallucinations, sleep paralysis, or cataplexy.  The Epworth score is 12 out of 24.  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  Prior to Admission medications   Medication Sig Start Date End Date Taking? Authorizing Provider  Docosahexaenoic Acid (DHA OMEGA 3 PO) Take 1 capsule by mouth daily.    Historical Provider, MD  docusate sodium (COLACE) 100 MG capsule Take 100 mg by mouth daily.    Historical Provider, MD  ibuprofen (ADVIL,MOTRIN) 600 MG tablet Take 1 tablet (600 mg total) by mouth every 6 (six) hours as needed for pain. 05/14/13   Dorien Chihuahuaara J. Richardson Doppole, MD  Prenatal Vit-Fe  Fumarate-FA (PRENATAL MULTIVITAMIN) TABS Take 1 tablet by mouth daily at 12 noon.    Historical Provider, MD  ranitidine (ZANTAC) 150 MG capsule Take 150 mg by mouth every evening.    Historical Provider, MD    No Known Allergies  Her family history includes Cancer in her father and maternal aunt.  She  reports that she quit smoking about 22 months ago. Her smoking use included Cigarettes. She has a 22.5 pack-year smoking history. She does not have any smokeless tobacco history on file. She reports that she does not drink alcohol or use illicit drugs.   Physical Exam: Blood pressure 106/66, pulse 92, temperature 98.4 F (36.9 C), temperature source Oral, height 5\' 5"  (1.651 m), weight 225 lb (102.059 kg), SpO2 99 %.  Body mass index is 37.44 kg/(m^2).  General - No distress ENT - No sinus tenderness, no oral exudate, no LAN, no thyromegaly, TM clear, pupils equal/reactive Cardiac - s1s2 regular, no murmur, pulses symmetric Chest - No wheeze/rales/dullness, good air entry, normal respiratory excursion Back - No focal tenderness Abd - Soft, non-tender, no organomegaly, + bowel sounds Ext - No edema Neuro - Normal strength, cranial nerves intact Skin - No rashes Psych - Normal mood, and behavior  Impression:  Snoring. She has snoring, sleep disruption, witnessed  apnea, and daytime sleepiness.  She has hx of depression and anxiety.  Her BMI is > 35.  I am concerned she could have sleep apnea.  We discussed how sleep apnea can affect various health problems including risks for hypertension, cardiovascular disease, and diabetes.  We also discussed how sleep disruption can increase risks for accident, such as while driving.  Weight loss as a means of improving sleep apnea was also reviewed.  Additional treatment options discussed were CPAP therapy, oral appliance, and surgical intervention. Plan: - will arrange for home sleep study to further assess pending insurance approval   Coralyn HellingVineet  Yanette Tripoli, M.D. Pager [phone]

## 2014-09-15 NOTE — Patient Instructions (Signed)
Will arrange for home sleep study Will call to arrange for follow up after sleep study reviewed  

## 2014-11-17 DIAGNOSIS — G4730 Sleep apnea, unspecified: Secondary | ICD-10-CM

## 2014-11-26 ENCOUNTER — Telehealth: Payer: Self-pay | Admitting: Pulmonary Disease

## 2014-11-26 DIAGNOSIS — G4733 Obstructive sleep apnea (adult) (pediatric): Secondary | ICD-10-CM

## 2014-11-26 NOTE — Telephone Encounter (Signed)
Spoke with pt. She had HST on 11/18/14. She is requesting results. Pt aware VS not back in the office until Wed. Please advise thanks

## 2014-12-01 DIAGNOSIS — G4730 Sleep apnea, unspecified: Secondary | ICD-10-CM

## 2014-12-01 NOTE — Telephone Encounter (Signed)
HST 11/17/14 >> AHI 8.3, SaO2 low 80%.  Will have my nurse inform pt that sleep study shows mild sleep apnea.  Options are 1) CPAP now, or 2) ROV now.  If she is okay with CPAP, then send order for auto CPAP with range 5 to 15 cm H2O with heated humidity and mask of choice.  Have download sent 1 month after starting CPAP and ROV 2 months after starting CPAP.

## 2014-12-02 NOTE — Telephone Encounter (Signed)
lmtcb x1 

## 2014-12-03 ENCOUNTER — Encounter: Payer: Self-pay | Admitting: Pulmonary Disease

## 2014-12-03 NOTE — Telephone Encounter (Signed)
Pt returning call for results.Patricia Rivas ° °

## 2014-12-03 NOTE — Telephone Encounter (Signed)
I spoke with patient about results and she verbalized understanding and had no questions Order for CPAP has been ordered

## 2015-01-13 ENCOUNTER — Ambulatory Visit: Payer: Self-pay | Admitting: Pulmonary Disease

## 2015-01-21 ENCOUNTER — Ambulatory Visit (INDEPENDENT_AMBULATORY_CARE_PROVIDER_SITE_OTHER): Payer: Managed Care, Other (non HMO) | Admitting: Psychiatry

## 2015-01-21 ENCOUNTER — Telehealth (HOSPITAL_COMMUNITY): Payer: Self-pay | Admitting: *Deleted

## 2015-01-21 ENCOUNTER — Encounter (HOSPITAL_COMMUNITY): Payer: Self-pay | Admitting: Psychiatry

## 2015-01-21 VITALS — BP 106/73 | HR 65 | Ht 66.0 in | Wt 247.2 lb

## 2015-01-21 DIAGNOSIS — F3281 Premenstrual dysphoric disorder: Secondary | ICD-10-CM

## 2015-01-21 DIAGNOSIS — F1021 Alcohol dependence, in remission: Secondary | ICD-10-CM | POA: Diagnosis not present

## 2015-01-21 DIAGNOSIS — F411 Generalized anxiety disorder: Secondary | ICD-10-CM | POA: Insufficient documentation

## 2015-01-21 DIAGNOSIS — F1990 Other psychoactive substance use, unspecified, uncomplicated: Secondary | ICD-10-CM | POA: Diagnosis not present

## 2015-01-21 DIAGNOSIS — N943 Premenstrual tension syndrome: Secondary | ICD-10-CM | POA: Diagnosis not present

## 2015-01-21 DIAGNOSIS — Z0000 Encounter for general adult medical examination without abnormal findings: Secondary | ICD-10-CM

## 2015-01-21 DIAGNOSIS — F1910 Other psychoactive substance abuse, uncomplicated: Secondary | ICD-10-CM

## 2015-01-21 HISTORY — DX: Other psychoactive substance abuse, uncomplicated: F19.10

## 2015-01-21 MED ORDER — CITALOPRAM HYDROBROMIDE 20 MG PO TABS: 20.0000 mg | ORAL_TABLET | Freq: Every day | ORAL | Status: DC

## 2015-01-21 MED ORDER — ARIPIPRAZOLE 5 MG PO TABS: 5.0000 mg | ORAL_TABLET | Freq: Every day | ORAL | Status: DC

## 2015-01-21 MED ORDER — GABAPENTIN 300 MG PO CAPS: 300.0000 mg | ORAL_CAPSULE | Freq: Three times a day (TID) | ORAL | Status: DC

## 2015-01-21 MED ORDER — LAMOTRIGINE 25 MG PO TABS: ORAL_TABLET | ORAL | Status: DC

## 2015-01-21 NOTE — Telephone Encounter (Signed)
Received via fax request for patient's medication Citalopram, Gabapentin, and Lamotrigine to be sent as 90 day RX's.  Per Dr. Michae KavaAgarwal, Citalopram and Gabapentin can be sent as 90 day script but not Lamotrigine.  Per Dr. Michae KavaAgarwal, patient's Lamotrigine is being tapered down so it has to remain as prescribed.   I will change Citalopram and Gabapentin.  I will notify [facility] Lamotrigine stays the same.

## 2015-01-21 NOTE — Patient Instructions (Signed)
1. Labs 2. EKG call [phone]  
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
## 2015-01-22 ENCOUNTER — Telehealth: Payer: Self-pay | Admitting: Pulmonary Disease

## 2015-01-22 NOTE — Telephone Encounter (Signed)
lmtcb X1 for pt  

## 2015-01-22 NOTE — Telephone Encounter (Signed)
Spoke with the pt She is calling to let VS know that she has not been using CPAP for the past 2 wks  She states that her mask was rubbing sores on her face  She got a new mask and is now using machine again  She states she had to wait for the sores to heal  Will forward to VS as FYI

## 2015-01-22 NOTE — Telephone Encounter (Signed)
Noted  

## 2015-01-26 ENCOUNTER — Telehealth (HOSPITAL_COMMUNITY): Payer: Self-pay

## 2015-01-26 DIAGNOSIS — F3281 Premenstrual dysphoric disorder: Secondary | ICD-10-CM

## 2015-01-26 MED ORDER — LITHIUM CARBONATE ER 300 MG PO TBCR: 300.0000 mg | EXTENDED_RELEASE_TABLET | Freq: Every day | ORAL | Status: DC

## 2015-01-26 NOTE — Telephone Encounter (Signed)
Pt states she is concerned about weight gain and doesn't want to take Abilify. She has not yet done the EKG.  A/P: PMDD, GAD 1. D/c Abilify 2. Continue to taper off Lamictal 3. Start trial of Lithobid 300mg  po qD.  risks/benefits and SE of the medication discussed. Pt verbalized understanding and verbal consent obtained for treatment.  Affirm with the patient that the medications are taken as ordered. Patient expressed understanding of how their medications were to be used.

## 2015-02-09 ENCOUNTER — Encounter: Payer: Self-pay | Admitting: Pulmonary Disease

## 2015-02-09 DIAGNOSIS — G4733 Obstructive sleep apnea (adult) (pediatric): Secondary | ICD-10-CM | POA: Insufficient documentation

## 2015-03-01 ENCOUNTER — Telehealth (HOSPITAL_COMMUNITY): Payer: Self-pay

## 2015-03-01 DIAGNOSIS — Z0000 Encounter for general adult medical examination without abnormal findings: Secondary | ICD-10-CM

## 2015-03-01 DIAGNOSIS — F3281 Premenstrual dysphoric disorder: Secondary | ICD-10-CM

## 2015-03-01 NOTE — Telephone Encounter (Signed)
Telephone call from patient requesting to get a message to Dr. Michae KavaAgarwal when she returns to the office on 03/02/15.  Patient stated she has noticed she is a little more irritable and on edge as she continues to taper off Lamictal and thinks she may need an increase in Lithium.  Patient stated she would be out of town on 03/23/15 when she is scheduled to see Dr. Michae KavaAgarwal next so will need to reschedule for a time once she returns.  Patient stated she would be out of town from 03/21/15-03/28/15.  Patient rescheduled with Dr. Lemar LoftyAgarwal's first available after her return, per patient's request for 04/06/15 at 3:30p.  Discussed with patient possible need to have lab work with a lithium level completed soon so Dr. Michae KavaAgarwal would have more information to adjust requested Lithium.  Patient stated she had [facility] and would obtain a lithium level as requested in forms she reports Dr. Michae KavaAgarwal discussed with her prior.  Patient instructed for best level labs it is best to have these labs drawn 8-12 hours after her last dosage so she is able to determine best time to get labs.  States she will get labs in the near future but wanted Dr. Michae KavaAgarwal to consider increase.  01/21/15 lab orders did not show a lithium level as this was started after.  Called patient back and left a message this nurse would request an order for a lithium level if Dr. Michae KavaAgarwal agreed would be needed and requested patient call back on 03/02/15 when Dr. Michae KavaAgarwal returns for further instruction.

## 2015-03-02 MED ORDER — LITHIUM CARBONATE ER 300 MG PO TBCR: 600.0000 mg | EXTENDED_RELEASE_TABLET | Freq: Every day | ORAL | Status: DC

## 2015-03-02 NOTE — Telephone Encounter (Signed)
Spoke with pt who states she thinks she needs to increase to Lithium. Pt has been very irritable for the last 5 days.Pt is not starting her period soon.  She will finish Lamictal on Thursday. She has completed Chantix.   A/P: Premenstrual dysphoric syndrome; GAD; Alcohol dependence in remission; Polysubstance abuse (opiates, THC, Benzos, cocaine); r/o Bipolar II disorder 1. Increase Lithium to 600mg  po qD 2. Pt will get a lithium level in 2 weeks.

## 2015-03-02 NOTE — Telephone Encounter (Signed)
Patient called and left a message requesting Dr. Agarwal call Michae Kavaher back on her work phone at [phone] as states she was getting a bad reception with her mobile.

## 2015-03-02 NOTE — Addendum Note (Signed)
Addended by: Oletta DarterAGARWAL, Myrah Strawderman on: 03/02/2015 04:04 PM   Modules accepted: Orders

## 2015-03-02 NOTE — Telephone Encounter (Signed)
Called and left a voice message for call back to the clinic. 

## 2015-03-03 ENCOUNTER — Telehealth (HOSPITAL_COMMUNITY): Payer: Self-pay

## 2015-03-03 NOTE — Telephone Encounter (Signed)
Received a fax form [facility] requesting to have patient's Lithium filled for a 90 day supply.  Called [facility] on [address] to inform pharmacist Dr. Michae KavaAgarwal was still adjusting patient's Lithium dosage and patient would be getting lab work in a few weeks.  Informed Dr. Michae KavaAgarwal would not want to fill a 90 day order until she had settled on an appropriate dosage of medication and the pharmacist agreed

## 2015-03-08 ENCOUNTER — Telehealth (HOSPITAL_COMMUNITY): Payer: Self-pay

## 2015-03-08 NOTE — Telephone Encounter (Signed)
Telephone message left for patient that the order for her needed Lithium level was placed in [facility] by Dr. Michae KavaAgarwal and [facility] should be able to see the order when she goes into their office from the date it was ordered 03/02/15.  Requested patient have them call this nurse if any problems when she goes in for her labs and reminded patient on message she should have her lithium level taken 8-12 hours after her last dosage and to remember what time she lasts takes it as the laboratory will ask.

## 2015-03-09 ENCOUNTER — Encounter: Payer: Self-pay | Admitting: Pulmonary Disease

## 2015-03-09 ENCOUNTER — Ambulatory Visit (INDEPENDENT_AMBULATORY_CARE_PROVIDER_SITE_OTHER): Payer: Managed Care, Other (non HMO) | Admitting: Pulmonary Disease

## 2015-03-09 VITALS — BP 104/64 | HR 58 | Ht 66.0 in | Wt 240.2 lb

## 2015-03-09 DIAGNOSIS — E669 Obesity, unspecified: Secondary | ICD-10-CM | POA: Diagnosis not present

## 2015-03-09 DIAGNOSIS — G4733 Obstructive sleep apnea (adult) (pediatric): Secondary | ICD-10-CM | POA: Diagnosis not present

## 2015-03-09 NOTE — Progress Notes (Addendum)
Chief Complaint  Patient presents with  . Follow-up    Wears CPAP nightly - would like to discuss coming off and trying dental appliance.     History of Present Illness: Patricia Rivas is a 34 y.o. female with obstructive sleep apnea.  She had sleep study in January.  This showed mild sleep apnea.  She was set up with CPAP.  She has improvement in her sleep quality with CPAP.  She was having trouble with mask at first, but this has gotten better.  She also has trouble getting tangled in her tubing.  She has a friend who is a Scientific laboratory techniciandietician.  They are working together on diet modification.  She has also been exercising more.  TESTS: HST 11/17/14 >> AHI 8.3, SaO2 low 80%. Auto CPAP 12/31/14 to 01/29/15 >> used on 21 of 30 nights with average 6 hrs and 40 min.  Average AHI is 2.1 with median CPAP 9 cm H2O and 95 th percentile CPAP 12 cm H20.   Past medical hx >> Anxiety, Depression, GERD, Allergies, ITP  Past surgical hx, Medications, Allergies, Family hx, Social hx all reviewed.   Physical Exam: Blood pressure 104/64, pulse 58, height 5\' 6"  (1.676 m), weight 240 lb 3.2 oz (108.954 kg), SpO2 98 %. Body mass index is 38.79 kg/(m^2).  General - No distress ENT - No sinus tenderness, no oral exudate, no LAN, MP 3, overbite Cardiac - s1s2 regular, no murmur Chest - No wheeze/rales/dullness Back - No focal tenderness Abd - Soft, non-tender Ext - No edema Neuro - Normal strength Skin - No rashes Psych - normal mood, and behavior   Assessment/Plan:  Obstructive sleep apnea. She has been compliant with CPAP and reports benefit.  She would like to determine if oral appliance is option for her. Plan: - she can continue auto CPAP for now - will arrange for referral to Dr. Althea GrimmerMark Katz to assess for oral appliance to treat her OSA  Obesity. Plan: - reviewed options to assist with her weight loss  Coralyn HellingVineet Anisha Starliper, MD  Pulmonary/Critical Care/Sleep Pager:  [phone]

## 2015-03-09 NOTE — Patient Instructions (Signed)
Will arrange for referral to Dr. Mark Katz to assess for oral appliance to treat obstructive sleep apnea  Follow up in 6 months 

## 2015-03-10 LAB — LITHIUM LEVEL: LITHIUM LVL: 0.4 meq/L — AB (ref 0.80–1.40)

## 2015-03-17 ENCOUNTER — Telehealth (HOSPITAL_COMMUNITY): Payer: Self-pay | Admitting: *Deleted

## 2015-03-17 DIAGNOSIS — F3281 Premenstrual dysphoric disorder: Secondary | ICD-10-CM

## 2015-03-17 MED ORDER — LITHIUM CARBONATE ER 300 MG PO TBCR: 600.0000 mg | EXTENDED_RELEASE_TABLET | Freq: Every day | ORAL | Status: DC

## 2015-03-17 NOTE — Telephone Encounter (Signed)
RECEIVED A FAX FROM [facility].  [facility] REQUEST A 90 DAY SUPPLY OF PATIENTS LITHIUM.  PER SHAWN T., RN AND DR. AGARWAL OKAY TO SEND RX AS A 90 DAY SUPPLY.

## 2015-03-23 ENCOUNTER — Ambulatory Visit (HOSPITAL_COMMUNITY): Payer: Self-pay | Admitting: Psychiatry

## 2015-04-06 ENCOUNTER — Other Ambulatory Visit (HOSPITAL_COMMUNITY): Payer: Self-pay | Admitting: Psychiatry

## 2015-04-06 ENCOUNTER — Encounter (HOSPITAL_COMMUNITY): Payer: Self-pay | Admitting: Psychiatry

## 2015-04-06 ENCOUNTER — Ambulatory Visit (INDEPENDENT_AMBULATORY_CARE_PROVIDER_SITE_OTHER): Payer: Managed Care, Other (non HMO) | Admitting: Psychiatry

## 2015-04-06 VITALS — BP 136/84 | HR 67 | Ht 66.0 in | Wt 239.4 lb

## 2015-04-06 DIAGNOSIS — F1910 Other psychoactive substance abuse, uncomplicated: Secondary | ICD-10-CM | POA: Diagnosis not present

## 2015-04-06 DIAGNOSIS — F1021 Alcohol dependence, in remission: Secondary | ICD-10-CM | POA: Diagnosis not present

## 2015-04-06 DIAGNOSIS — F411 Generalized anxiety disorder: Secondary | ICD-10-CM

## 2015-04-06 DIAGNOSIS — N943 Premenstrual tension syndrome: Secondary | ICD-10-CM

## 2015-04-06 DIAGNOSIS — F3281 Premenstrual dysphoric disorder: Secondary | ICD-10-CM

## 2015-04-06 MED ORDER — CITALOPRAM HYDROBROMIDE 20 MG PO TABS: 20.0000 mg | ORAL_TABLET | Freq: Every day | ORAL | Status: DC

## 2015-04-06 MED ORDER — LITHIUM CARBONATE ER 450 MG PO TBCR: 900.0000 mg | EXTENDED_RELEASE_TABLET | Freq: Every day | ORAL | Status: DC

## 2015-04-06 MED ORDER — GABAPENTIN 300 MG PO CAPS: 300.0000 mg | ORAL_CAPSULE | Freq: Three times a day (TID) | ORAL | Status: DC
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
## 2015-05-18 ENCOUNTER — Other Ambulatory Visit (HOSPITAL_COMMUNITY): Payer: Self-pay | Admitting: Psychiatry

## 2015-05-24 ENCOUNTER — Other Ambulatory Visit (HOSPITAL_COMMUNITY): Payer: Self-pay | Admitting: Psychiatry

## 2015-05-24 NOTE — Telephone Encounter (Signed)
PT called for a refill for Celexa . Per Dr. Rutherford Limerick pt is authorized for a refill for Celexa , Qty 90. Prescription was sent pharmacy. PT has a f/u appt on 11/17. Called and informed pt of prescription status. PT verbalizes understanding.

## 2015-06-04 ENCOUNTER — Other Ambulatory Visit (HOSPITAL_COMMUNITY): Payer: Self-pay | Admitting: Psychiatry

## 2015-06-05 ENCOUNTER — Other Ambulatory Visit (HOSPITAL_COMMUNITY): Payer: Self-pay | Admitting: Psychiatry

## 2015-06-07 ENCOUNTER — Telehealth (HOSPITAL_COMMUNITY): Payer: Self-pay

## 2015-06-07 NOTE — Telephone Encounter (Signed)
Telephone call with patient who reported she is starting to get low on Lithium and needs a refill.  Informed patient a new 90 day order plus 1 refill was e-scribed to [facility] on 04/06/15.  Patient stated she recently received her Gabapentin but not her Lithium and that they are informing her they need an order.  Called [facility] and spoke with [facility] who verified they had a new order on file plus the one refill but had not sent the medication, placed the order on hold, waiting for patient to call and request it and to verify she would pay the co-payment for the medication.  Called patient back and gave her this information plus the [phone] phone number for [facility] to call and have medications sent.  Patient to call back if any more problems getting her medication sent.

## 2015-08-03 ENCOUNTER — Ambulatory Visit (HOSPITAL_COMMUNITY): Payer: Self-pay | Admitting: Psychiatry

## 2015-08-18 ENCOUNTER — Other Ambulatory Visit (HOSPITAL_COMMUNITY): Payer: Self-pay | Admitting: Psychiatry

## 2015-08-27 ENCOUNTER — Other Ambulatory Visit (HOSPITAL_COMMUNITY): Payer: Self-pay | Admitting: Psychiatry

## 2015-09-01 NOTE — Telephone Encounter (Signed)
Revonda StandardAllison from [facility] called requesting refill for Celexa 20mg . Called [facility], mail order, sent out 90 day supply on 10/8. States pt not due for refill until December.

## 2015-09-09 ENCOUNTER — Ambulatory Visit (INDEPENDENT_AMBULATORY_CARE_PROVIDER_SITE_OTHER): Payer: Managed Care, Other (non HMO) | Admitting: Psychiatry

## 2015-09-09 ENCOUNTER — Encounter (HOSPITAL_COMMUNITY): Payer: Self-pay | Admitting: Psychiatry

## 2015-09-09 VITALS — BP 129/85 | HR 80 | Ht 66.0 in | Wt 256.6 lb

## 2015-09-09 DIAGNOSIS — F1921 Other psychoactive substance dependence, in remission: Secondary | ICD-10-CM | POA: Insufficient documentation

## 2015-09-09 DIAGNOSIS — F339 Major depressive disorder, recurrent, unspecified: Secondary | ICD-10-CM | POA: Insufficient documentation

## 2015-09-09 DIAGNOSIS — F3281 Premenstrual dysphoric disorder: Secondary | ICD-10-CM | POA: Diagnosis not present

## 2015-09-09 DIAGNOSIS — F1021 Alcohol dependence, in remission: Secondary | ICD-10-CM | POA: Diagnosis not present

## 2015-09-09 DIAGNOSIS — F411 Generalized anxiety disorder: Secondary | ICD-10-CM

## 2015-09-09 HISTORY — DX: Other psychoactive substance dependence, in remission: F19.21

## 2015-09-09 HISTORY — DX: Major depressive disorder, recurrent, unspecified: F33.9

## 2015-09-09 MED ORDER — CITALOPRAM HYDROBROMIDE 20 MG PO TABS: 20.0000 mg | ORAL_TABLET | Freq: Every day | ORAL | Status: DC

## 2015-09-09 MED ORDER — GABAPENTIN 300 MG PO CAPS: 300.0000 mg | ORAL_CAPSULE | Freq: Three times a day (TID) | ORAL | Status: DC

## 2015-09-09 MED ORDER — LITHIUM CARBONATE ER 450 MG PO TBCR: 900.0000 mg | EXTENDED_RELEASE_TABLET | Freq: Every day | ORAL | Status: DC
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
## 2015-11-04 ENCOUNTER — Other Ambulatory Visit: Payer: Self-pay | Admitting: Obstetrics & Gynecology

## 2015-11-04 ENCOUNTER — Other Ambulatory Visit (HOSPITAL_COMMUNITY)
Admission: RE | Admit: 2015-11-04 | Discharge: 2015-11-04 | Disposition: A | Discharge status: Home / Self Care | Payer: Managed Care, Other (non HMO) | Source: Ambulatory Visit | Attending: Obstetrics and Gynecology | Admitting: Obstetrics and Gynecology

## 2015-11-04 DIAGNOSIS — Z01419 Encounter for gynecological examination (general) (routine) without abnormal findings: Secondary | ICD-10-CM | POA: Insufficient documentation

## 2015-11-09 LAB — CYTOLOGY - PAP

## 2015-12-14 ENCOUNTER — Encounter (HOSPITAL_COMMUNITY): Payer: Self-pay | Admitting: Psychiatry

## 2015-12-14 ENCOUNTER — Ambulatory Visit (INDEPENDENT_AMBULATORY_CARE_PROVIDER_SITE_OTHER): Payer: Managed Care, Other (non HMO) | Admitting: Psychiatry

## 2015-12-14 VITALS — BP 138/81 | HR 80 | Ht 65.0 in | Wt 265.4 lb

## 2015-12-14 DIAGNOSIS — F411 Generalized anxiety disorder: Secondary | ICD-10-CM | POA: Diagnosis not present

## 2015-12-14 DIAGNOSIS — Z79899 Other long term (current) drug therapy: Secondary | ICD-10-CM

## 2015-12-14 DIAGNOSIS — F3281 Premenstrual dysphoric disorder: Secondary | ICD-10-CM | POA: Diagnosis not present

## 2015-12-14 DIAGNOSIS — F1920 Other psychoactive substance dependence, uncomplicated: Secondary | ICD-10-CM

## 2015-12-14 MED ORDER — CITALOPRAM HYDROBROMIDE 20 MG PO TABS: 20.0000 mg | ORAL_TABLET | Freq: Every day | ORAL | Status: DC

## 2015-12-14 MED ORDER — GABAPENTIN 300 MG PO CAPS: 300.0000 mg | ORAL_CAPSULE | Freq: Four times a day (QID) | ORAL | Status: DC

## 2015-12-14 MED ORDER — LITHIUM CARBONATE ER 450 MG PO TBCR: 900.0000 mg | EXTENDED_RELEASE_TABLET | Freq: Every day | ORAL | Status: DC
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
## 2016-02-16 ENCOUNTER — Other Ambulatory Visit (HOSPITAL_COMMUNITY): Payer: Self-pay | Admitting: Psychiatry

## 2016-02-18 ENCOUNTER — Other Ambulatory Visit (HOSPITAL_COMMUNITY): Payer: Self-pay

## 2016-02-18 DIAGNOSIS — F411 Generalized anxiety disorder: Secondary | ICD-10-CM

## 2016-02-18 MED ORDER — GABAPENTIN 300 MG PO CAPS: 300.0000 mg | ORAL_CAPSULE | Freq: Four times a day (QID) | ORAL | Status: DC

## 2016-02-18 NOTE — Telephone Encounter (Signed)
Per Dr. Michae KavaAgarwal I gave the patient one more month of the gabapentin, it was only written for 2 months

## 2016-02-25 ENCOUNTER — Other Ambulatory Visit (HOSPITAL_COMMUNITY): Payer: Self-pay | Admitting: Psychiatry

## 2016-03-14 ENCOUNTER — Encounter (HOSPITAL_COMMUNITY): Payer: Self-pay | Admitting: Psychiatry

## 2016-03-14 ENCOUNTER — Ambulatory Visit (INDEPENDENT_AMBULATORY_CARE_PROVIDER_SITE_OTHER): Payer: Managed Care, Other (non HMO) | Admitting: Psychiatry

## 2016-03-14 VITALS — BP 126/80 | HR 85 | Ht 66.0 in | Wt 271.8 lb

## 2016-03-14 DIAGNOSIS — F1021 Alcohol dependence, in remission: Secondary | ICD-10-CM | POA: Diagnosis not present

## 2016-03-14 DIAGNOSIS — F3281 Premenstrual dysphoric disorder: Secondary | ICD-10-CM | POA: Diagnosis not present

## 2016-03-14 DIAGNOSIS — F411 Generalized anxiety disorder: Secondary | ICD-10-CM | POA: Diagnosis not present

## 2016-03-14 MED ORDER — LAMOTRIGINE 25 MG PO TABS: ORAL_TABLET | ORAL | Status: DC

## 2016-03-14 MED ORDER — CITALOPRAM HYDROBROMIDE 20 MG PO TABS: 20.0000 mg | ORAL_TABLET | Freq: Every day | ORAL | Status: DC

## 2016-03-14 MED ORDER — GABAPENTIN 300 MG PO CAPS: 300.0000 mg | ORAL_CAPSULE | Freq: Four times a day (QID) | ORAL | Status: DC
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
## 2016-05-18 ENCOUNTER — Ambulatory Visit (INDEPENDENT_AMBULATORY_CARE_PROVIDER_SITE_OTHER): Payer: Managed Care, Other (non HMO) | Admitting: Psychiatry

## 2016-05-18 ENCOUNTER — Encounter (HOSPITAL_COMMUNITY): Payer: Self-pay | Admitting: Psychiatry

## 2016-05-18 VITALS — BP 128/70 | HR 79 | Ht 65.0 in | Wt 267.4 lb

## 2016-05-18 DIAGNOSIS — F1921 Other psychoactive substance dependence, in remission: Secondary | ICD-10-CM | POA: Diagnosis not present

## 2016-05-18 DIAGNOSIS — F1021 Alcohol dependence, in remission: Secondary | ICD-10-CM

## 2016-05-18 DIAGNOSIS — F411 Generalized anxiety disorder: Secondary | ICD-10-CM | POA: Diagnosis not present

## 2016-05-18 DIAGNOSIS — F3281 Premenstrual dysphoric disorder: Secondary | ICD-10-CM | POA: Diagnosis not present

## 2016-05-18 MED ORDER — LAMOTRIGINE 25 MG PO TABS: 75.0000 mg | ORAL_TABLET | Freq: Every day | ORAL | 0 refills | Status: DC

## 2016-05-18 MED ORDER — CITALOPRAM HYDROBROMIDE 20 MG PO TABS: 20.0000 mg | ORAL_TABLET | Freq: Every day | ORAL | 0 refills | Status: DC

## 2016-05-18 MED ORDER — GABAPENTIN 300 MG PO CAPS: 300.0000 mg | ORAL_CAPSULE | Freq: Four times a day (QID) | ORAL | 0 refills | Status: DC
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
## 2016-07-03 ENCOUNTER — Ambulatory Visit (HOSPITAL_COMMUNITY): Payer: Self-pay | Admitting: Clinical

## 2016-07-24 ENCOUNTER — Ambulatory Visit (HOSPITAL_COMMUNITY): Payer: Self-pay | Admitting: Clinical

## 2016-08-07 ENCOUNTER — Other Ambulatory Visit (HOSPITAL_COMMUNITY): Payer: Self-pay | Admitting: Psychiatry

## 2016-08-07 DIAGNOSIS — F3281 Premenstrual dysphoric disorder: Secondary | ICD-10-CM

## 2016-08-10 ENCOUNTER — Other Ambulatory Visit (HOSPITAL_COMMUNITY): Payer: Self-pay

## 2016-08-21 ENCOUNTER — Ambulatory Visit (INDEPENDENT_AMBULATORY_CARE_PROVIDER_SITE_OTHER): Payer: Managed Care, Other (non HMO) | Admitting: Clinical

## 2016-08-21 DIAGNOSIS — F3181 Bipolar II disorder: Secondary | ICD-10-CM

## 2016-08-21 DIAGNOSIS — F411 Generalized anxiety disorder: Secondary | ICD-10-CM | POA: Diagnosis not present

## 2016-08-23 ENCOUNTER — Encounter (HOSPITAL_COMMUNITY): Payer: Self-pay | Admitting: Clinical
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
## 2016-08-24 ENCOUNTER — Ambulatory Visit (INDEPENDENT_AMBULATORY_CARE_PROVIDER_SITE_OTHER): Payer: Managed Care, Other (non HMO) | Admitting: Psychiatry

## 2016-08-24 ENCOUNTER — Encounter (HOSPITAL_COMMUNITY): Payer: Self-pay | Admitting: Psychiatry

## 2016-08-24 DIAGNOSIS — F411 Generalized anxiety disorder: Secondary | ICD-10-CM

## 2016-08-24 DIAGNOSIS — Z801 Family history of malignant neoplasm of trachea, bronchus and lung: Secondary | ICD-10-CM

## 2016-08-24 DIAGNOSIS — Z811 Family history of alcohol abuse and dependence: Secondary | ICD-10-CM

## 2016-08-24 DIAGNOSIS — Z8042 Family history of malignant neoplasm of prostate: Secondary | ICD-10-CM | POA: Diagnosis not present

## 2016-08-24 DIAGNOSIS — F3281 Premenstrual dysphoric disorder: Secondary | ICD-10-CM | POA: Diagnosis not present

## 2016-08-24 DIAGNOSIS — Z818 Family history of other mental and behavioral disorders: Secondary | ICD-10-CM

## 2016-08-24 DIAGNOSIS — Z803 Family history of malignant neoplasm of breast: Secondary | ICD-10-CM

## 2016-08-24 MED ORDER — GABAPENTIN 300 MG PO CAPS: 300.0000 mg | ORAL_CAPSULE | Freq: Four times a day (QID) | ORAL | 0 refills | Status: DC

## 2016-08-24 MED ORDER — LAMOTRIGINE 25 MG PO TABS: ORAL_TABLET | ORAL | 0 refills | Status: DC

## 2016-08-24 MED ORDER — CITALOPRAM HYDROBROMIDE 20 MG PO TABS: 20.0000 mg | ORAL_TABLET | Freq: Every day | ORAL | 0 refills | Status: DC
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
## 2016-09-06 ENCOUNTER — Ambulatory Visit (INDEPENDENT_AMBULATORY_CARE_PROVIDER_SITE_OTHER): Payer: Managed Care, Other (non HMO) | Admitting: Clinical

## 2016-09-06 DIAGNOSIS — F3181 Bipolar II disorder: Secondary | ICD-10-CM

## 2016-09-06 DIAGNOSIS — F411 Generalized anxiety disorder: Secondary | ICD-10-CM | POA: Diagnosis not present

## 2016-09-06 NOTE — Progress Notes (Addendum)
   THERAPIST PROGRESS NOTE  Session Time: 7:05 - 8:00  Participation Level: Active  Behavioral Response: CasualAlertDepressed  Type of Therapy: Individual Therapy  Treatment Goals addressed: Improve Psychiatric Symptoms, emotional regulation skills (decreased angry outburst, improved self-esteem, improved relationships), unhelpful thought patterns, interpersonal relationship skills (decrease angry outburst, decrease resentment, decrease codependence), Stress management, improve self esteem, learn about diagnosis, healthy coping skills  Interventions: Motivational Interviewing, CBT, Grounding & Mindfulness Techniques  Summary: Patricia Rivas is a 35 y.o. female who presents with Bipolar II Generalized Anxiety Disorder   Suicidal/Homicidal: No -without intent/plan  Therapist Response:  Priti met with clinician for an individual session. [name] shared about her psychiatric symptoms, her current life events and her goals for therapy. [name] shared that she is ready to work on improving her psychiatric symptoms. Clinician introduced grounding and mindfulness techniques. Clinician explained the process purpose and practice of the techniques.client and clinician practice some of the techniques together. [name] asked clarifying questions which clinician answered. Clinician introduced some basic CBT concepts. Client and clinician discussed the thought of motion connection. Client and clinician briefly discussed how to challenge negative thoughts and emotions. Client and clinician agreed to discuss this further at future sessions. Clinician gave client a homework packet on bipolar (1, 2, and 3) which [name] agreed to complete and bring back with her next session. She also agreed to practice her grounding and mindfulness techniques daily until next session.  Plan: Return again in 1-2 weeks.  Diagnosis:     Axis I: Bipolar II Generalized Anxiety Disorder    Brookelyn Gaynor A,  LCSW 09/06/2016

## 2016-09-13 ENCOUNTER — Encounter (HOSPITAL_COMMUNITY): Payer: Self-pay | Admitting: Clinical

## 2016-09-25 ENCOUNTER — Other Ambulatory Visit (HOSPITAL_COMMUNITY): Payer: Self-pay | Admitting: Psychiatry

## 2016-09-25 DIAGNOSIS — F3281 Premenstrual dysphoric disorder: Secondary | ICD-10-CM

## 2016-09-26 NOTE — Telephone Encounter (Signed)
Met with Dr. Lovena Le helping to cover for Dr. Doyne Keel while out who approved a new Lamictal order plus one refill to last patient until she returns for her next visit on 11/30/16.  New order e-scribed to patient's [place] as authorized by Dr. Lovena Le.

## 2016-09-28 ENCOUNTER — Ambulatory Visit (INDEPENDENT_AMBULATORY_CARE_PROVIDER_SITE_OTHER): Payer: Managed Care, Other (non HMO) | Admitting: Clinical

## 2016-09-28 DIAGNOSIS — F3181 Bipolar II disorder: Secondary | ICD-10-CM

## 2016-09-28 DIAGNOSIS — F411 Generalized anxiety disorder: Secondary | ICD-10-CM | POA: Diagnosis not present

## 2016-09-28 NOTE — Progress Notes (Signed)
   THERAPIST PROGRESS NOTE  Session Time: 8:08 - 9:05  Participation Level: Active  Behavioral Response: CasualAlertDepressed  Type of Therapy: Individual Therapy  Treatment Goals addressed: Improve Psychiatric Symptoms, emotional regulation skills, unhelpful thought patterns, interpersonal relationship skills , Stress management, improve self esteem, learn about diagnosis, healthy coping skills  Interventions: Motivational Interviewing, CBT, Grounding & Mindfulness Techniques, psychoeducation  Summary: Patricia Rivas is a 35 y.o. female who presents with Bipolar II, Generalized Anxiety Disorder   Suicidal/Homicidal: No -without intent/plan  Therapist Response:  Patricia Rivas met with clinician for an individual session. Patricia Rivas shared about her psychiatric symptoms, her current life events and her homework. Patricia Rivas shared that she had some good days and some difficult days (symptoms) since last session. She shared that she had completed her homework packets bipolar 2-3. Client and clinician reviewed and discussed her homework. Client and clinician discussed keeping a daily log of her symptoms for a full month. Clinician gave her packet 4 which she agreed to complete before next session. Clinician asked open ended questions about the difficult days that she experienced. Patricia Rivas identified an incident that she had a lot of negative thoughts about. Clinician asked open ended questions and Patricia Rivas identified her emotions and her negative thoughts. She identified the evidence for and against the thoughts. Client and clinician discussed healthier alternative and believable alternative thoughts. Client and clinician discussed how her emotions and actions would change if she believed the healthier alternative thoughts. Patricia Rivas shared that she has been practicing her grounding and mindfulness techniques. Client and clinician discussed how she could improve her practice.    Plan: Return again in 1-2  weeks.  Diagnosis:     Axis I: Bipolar II Generalized Anxiety Disorder     Nehemiah Montee A, LCSW 09/28/2016

## 2016-10-03 ENCOUNTER — Encounter (HOSPITAL_COMMUNITY): Payer: Self-pay | Admitting: Clinical

## 2016-10-25 ENCOUNTER — Ambulatory Visit (HOSPITAL_COMMUNITY): Payer: Self-pay | Admitting: Clinical

## 2016-11-08 ENCOUNTER — Ambulatory Visit (HOSPITAL_COMMUNITY): Payer: Self-pay | Admitting: Clinical

## 2016-11-13 ENCOUNTER — Other Ambulatory Visit (HOSPITAL_COMMUNITY): Payer: Self-pay | Admitting: Psychiatry

## 2016-11-13 DIAGNOSIS — F411 Generalized anxiety disorder: Secondary | ICD-10-CM

## 2016-11-21 ENCOUNTER — Ambulatory Visit (HOSPITAL_COMMUNITY): Payer: Self-pay | Admitting: Clinical

## 2016-11-25 ENCOUNTER — Other Ambulatory Visit (HOSPITAL_COMMUNITY): Payer: Self-pay | Admitting: Psychiatry

## 2016-11-25 DIAGNOSIS — F411 Generalized anxiety disorder: Secondary | ICD-10-CM

## 2016-11-30 ENCOUNTER — Ambulatory Visit (INDEPENDENT_AMBULATORY_CARE_PROVIDER_SITE_OTHER): Payer: Managed Care, Other (non HMO) | Admitting: Psychiatry

## 2016-11-30 ENCOUNTER — Encounter (HOSPITAL_COMMUNITY): Payer: Self-pay | Admitting: Psychiatry

## 2016-11-30 DIAGNOSIS — F411 Generalized anxiety disorder: Secondary | ICD-10-CM | POA: Diagnosis not present

## 2016-11-30 DIAGNOSIS — F3281 Premenstrual dysphoric disorder: Secondary | ICD-10-CM

## 2016-11-30 DIAGNOSIS — Z8042 Family history of malignant neoplasm of prostate: Secondary | ICD-10-CM

## 2016-11-30 DIAGNOSIS — Z803 Family history of malignant neoplasm of breast: Secondary | ICD-10-CM

## 2016-11-30 DIAGNOSIS — Z79899 Other long term (current) drug therapy: Secondary | ICD-10-CM

## 2016-11-30 DIAGNOSIS — Z800 Family history of malignant neoplasm of digestive organs: Secondary | ICD-10-CM | POA: Diagnosis not present

## 2016-11-30 DIAGNOSIS — Z811 Family history of alcohol abuse and dependence: Secondary | ICD-10-CM

## 2016-11-30 DIAGNOSIS — Z818 Family history of other mental and behavioral disorders: Secondary | ICD-10-CM

## 2016-11-30 MED ORDER — GABAPENTIN 300 MG PO CAPS: ORAL_CAPSULE | ORAL | 0 refills | Status: DC

## 2016-11-30 MED ORDER — LAMOTRIGINE 25 MG PO TABS: ORAL_TABLET | ORAL | 0 refills | Status: DC

## 2016-11-30 MED ORDER — CITALOPRAM HYDROBROMIDE 20 MG PO TABS: 20.0000 mg | ORAL_TABLET | Freq: Every day | ORAL | 0 refills | Status: DC
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
## 2016-12-06 ENCOUNTER — Encounter (HOSPITAL_COMMUNITY): Payer: Self-pay | Admitting: Clinical

## 2016-12-06 ENCOUNTER — Ambulatory Visit (HOSPITAL_COMMUNITY): Payer: Managed Care, Other (non HMO) | Admitting: Clinical

## 2016-12-06 DIAGNOSIS — F3181 Bipolar II disorder: Secondary | ICD-10-CM

## 2016-12-06 DIAGNOSIS — F411 Generalized anxiety disorder: Secondary | ICD-10-CM

## 2016-12-06 NOTE — Progress Notes (Signed)
   THERAPIST PROGRESS NOTE  Session Time: 8:03 - 9:00am  Participation Level: Active  Behavioral Response: CasualAlertAnxious  Type of Therapy: Individual Therapy  Treatment Goals addressed: Improve Psychiatric Symptoms, emotional regulation skills (decreased angry outburst,  improved relationships), unhelpful thought patterns, interpersonal relationship skills (decrease angry outburst, decrease resentment,), Stress management, learn about diagnosis, healthy coping skills  Interventions: Motivational Interviewing, CBT, Grounding & Mindfulness Techniques, psychoeducation  Summary: COBY ANTROBUS is a 36 y.o. female who presents with Bipolar II, Generalized Anxiety Disorder   Suicidal/Homicidal: No -without intent/plan  Therapist Response:  Brianda met with clinician for an individual session. Laaibah shared about her psychiatric symptoms, her current life events and her homework. Annais shared that she had some good days and some challenging (emotional) days since last session. Kursten shared that she had completed her homework. Client and clinician reviewed and discussed homework packet Bipolar 4. Client and clinician discussed strategies fo  depression.  she shared that she had completed some CBT worksheets. Client and clinician reviewed and discussed the worksheet. Clinician asked open ended questions and Hassan Rowan expanded on her answers and her insight. Rylea and clinician discussed how challenging and changing her thoughts affected her emotions. Tia shared that it was helping to decrease her anger and improve her relationships. Client clinician discussed this process Jorie shared that she continues to practice her mindfulness and grounding techniques though not every day. Clinician gave Katera packet 5. Clinician gave Robyne several panel thought record sheet. Clatie agreed to complete the packet and do continue to do the worksheets. She also agreed to continue practicing her  grounding and mindfulness    Plan: Return again in 1-2 weeks.  Diagnosis:     Axis I: Bipolar II, Generalized Anxiety Disorder     Joliet Mallozzi A, LCSW 12/06/2016

## 2017-01-01 ENCOUNTER — Encounter (HOSPITAL_COMMUNITY): Payer: Self-pay | Admitting: Clinical

## 2017-01-01 ENCOUNTER — Ambulatory Visit (INDEPENDENT_AMBULATORY_CARE_PROVIDER_SITE_OTHER): Payer: Managed Care, Other (non HMO) | Admitting: Clinical

## 2017-01-01 DIAGNOSIS — F411 Generalized anxiety disorder: Secondary | ICD-10-CM

## 2017-01-01 DIAGNOSIS — F3181 Bipolar II disorder: Secondary | ICD-10-CM | POA: Diagnosis not present

## 2017-01-01 NOTE — Progress Notes (Signed)
   THERAPIST PROGRESS NOTE  Session Time: 8:00 -8:55  Participation Level: Active  Behavioral Response: CasualAlertNA  Type of Therapy: Individual Therapy  Treatment Goals addressed: Improve Psychiatric Symptoms, emotional regulation skills (improved self-esteem, improved relationships), unhelpful thought patterns, interpersonal relationship skills ( decrease resentment, decrease codependence), Stress management, improve self esteem, learn about diagnosis, healthy coping skills  Interventions: Motivational Interviewing, CBT,  psychoeducation  Summary: URIAH TRUEBA is a 36 y.o. female who presents with Bipolar II, Generalized Anxiety Disorder   Suicidal/Homicidal: No -without intent/plan  Therapist Response:  Alzada met with clinician for an individual session. Kynsley shared about her psychiatric symptoms, her current life events and her homework. Leafy shared that she has been seeing an improvement in her mood though she is still having challenging days. She shared she had completed her homework packet 5 bipolar. Client and clinician review and discussed her homework. Clinician asked open ended questions and Akita shared her insights and how the information applied to her own experience. She shared she has been keeping the gratitude journal and has noticed as her thoughts are improving she is feeling more love and less anxiety. Courtland shared some of the unhelpful thoughts that have been recurring for her related to her relationships. Clinician asked open ended questions and Obie identified her negative emotions, the evidence for and against the thoughts. She was then able to formulate healthier alternative thoughts.  Client and clinician discussed her grounding and mindfulness techniques. Clinician taught her a new one which client and clinician practiced together. Layana agreed to complete bipolar packet 6, continue with her gratitude journal, continue practicing her grounding  and mindfulness techniques.  Plan: Return again in 1-2 weeks.  Diagnosis:     Axis I: Bipolar II, Generalized Anxiety Disorder     Adyline Huberty A, LCSW 01/01/2017

## 2017-01-22 ENCOUNTER — Encounter (HOSPITAL_COMMUNITY): Payer: Self-pay | Admitting: Clinical

## 2017-01-22 ENCOUNTER — Ambulatory Visit (INDEPENDENT_AMBULATORY_CARE_PROVIDER_SITE_OTHER): Payer: Managed Care, Other (non HMO) | Admitting: Clinical

## 2017-01-22 DIAGNOSIS — F411 Generalized anxiety disorder: Secondary | ICD-10-CM

## 2017-01-22 DIAGNOSIS — F3181 Bipolar II disorder: Secondary | ICD-10-CM | POA: Diagnosis not present

## 2017-01-22 NOTE — Progress Notes (Signed)
   THERAPIST PROGRESS NOTE   Session Time: 8:07 -8:56  Participation Level: Active  Behavioral Response: CasualAlertNA   Type of Therapy: Individual Therapy  Treatment Goals addressed: Improve Psychiatric Symptoms, emotional regulation skills, improve unhelpful thought patterns, interpersonal relationship skills, Stress management,  learn about diagnosis, healthy coping skills  Interventions: Motivational Interviewing, CBT, Grounding & Mindfulness Techniques, psychoeducation  Summary: Patricia Rivas is a 36 y.o. female who presents with Bipolar II, Generalized Anxiety Disorder   Suicidal/Homicidal: No -without intent/plan  Therapist Response:  Patricia Rivas met with clinician for an individual session. Patricia Rivas shared about her psychiatric symptoms, her current life events and her homework. Patricia Rivas shared that she was doing a little better with her symptoms. Clinician asked open ended questions and Patricia Rivas shared her thoughts and insights from practicing the tools learned in therapy. Client and clinician reviewed and discussed her homework. She shared that in addition to the homework packet she had completed some 7 panel thought record sheets (about relationships). Client and clinician reviewed her thought record sheets. Clinician asked open ended questions and Patricia Rivas expanded on her answers. Client and clinician discussed how her behavior might change if she focused on the healthier alternative thoughts. Client and clinician discussed the process and Patricia Rivas shared additional insights. Clinician taught Patricia Rivas an additional grounding technique (stress management and improve mood) which client and clinician practiced together. Clinician gave her packet 7 bipolar which she agreed to complete before next session.   Plan: Return again in 1-2 weeks.  Diagnosis:     Axis I: Bipolar II, Generalized Anxiety Disorder   Shavon Zenz A, LCSW 01/22/2017

## 2017-02-06 ENCOUNTER — Encounter (HOSPITAL_COMMUNITY): Payer: Self-pay | Admitting: Clinical

## 2017-02-06 ENCOUNTER — Ambulatory Visit (INDEPENDENT_AMBULATORY_CARE_PROVIDER_SITE_OTHER): Payer: Managed Care, Other (non HMO) | Admitting: Clinical

## 2017-02-06 DIAGNOSIS — F3181 Bipolar II disorder: Secondary | ICD-10-CM | POA: Diagnosis not present

## 2017-02-06 DIAGNOSIS — F411 Generalized anxiety disorder: Secondary | ICD-10-CM

## 2017-02-06 NOTE — Progress Notes (Signed)
   THERAPIST PROGRESS NOTE  Session Time:  8:00 -8:55  Participation Level: Active  Behavioral Response: CasualAlertNA  Type of Therapy: Individual Therapy  Treatment Goals addressed: Improve Psychiatric Symptoms, emotional regulation skills, unhelpful thought patterns, learn about diagnosis, healthy coping skills  Interventions: Motivational Interviewing, CBT,  psychoeducation  Summary: Patricia Rivas is a 36 y.o. female who presents with Bipolar II, Generalized Anxiety Disorder   Suicidal/Homicidal: No -without intent/plan  Therapist Response:  Patricia Rivas met with clinician for an individual session. Patricia Rivas shared about her psychiatric symptoms, her current life events and her homework. Patricia Rivas shared that she had been doing well since the last session. Clinician asked open ended questions and Livie shared about her successes in using her skills taught in therapy. Patricia Rivas shared some areas that she still struggles with. Client and clinician used a 7 panel thought record sheet for her negative automatic thoughts. Clinician asked open ended questions. Patricia Rivas identified the evidence for and against the negative automatic thoughts. She then identified the evidence for and against for and against the negative thoughts. She was then able to formulate healthier alternative thoughts. Client and clinician discussed how her interactions with others would change if she believed the healthier alternative thoughts. Client and clinician reviewed and discussed her homework packet. Clinician gave her bipolar packet 8. Client and clinician agreed she is getting ready to graduate. Client and clinician agreed to 2 more sessions to ensure she can maintain her skills.    Plan: Return again in 1-2 weeks.  Diagnosis:     Axis I: Bipolar II, Generalized Anxiety Disorder     Furkan Keenum A, LCSW 02/06/2017

## 2017-02-19 ENCOUNTER — Other Ambulatory Visit (HOSPITAL_COMMUNITY)
Admission: RE | Admit: 2017-02-19 | Discharge: 2017-02-19 | Disposition: A | Discharge status: Home / Self Care | Payer: Managed Care, Other (non HMO) | Source: Ambulatory Visit | Attending: Obstetrics and Gynecology | Admitting: Obstetrics and Gynecology

## 2017-02-19 ENCOUNTER — Other Ambulatory Visit: Payer: Self-pay | Admitting: Obstetrics and Gynecology

## 2017-02-19 DIAGNOSIS — Z01419 Encounter for gynecological examination (general) (routine) without abnormal findings: Secondary | ICD-10-CM | POA: Diagnosis present

## 2017-02-19 DIAGNOSIS — Z1151 Encounter for screening for human papillomavirus (HPV): Secondary | ICD-10-CM | POA: Insufficient documentation

## 2017-02-20 ENCOUNTER — Other Ambulatory Visit (HOSPITAL_COMMUNITY): Payer: Self-pay | Admitting: Psychiatry

## 2017-02-20 DIAGNOSIS — F411 Generalized anxiety disorder: Secondary | ICD-10-CM

## 2017-02-21 LAB — CYTOLOGY - PAP
Diagnosis: NEGATIVE
HPV (WINDOPATH): NOT DETECTED

## 2017-02-27 ENCOUNTER — Encounter (HOSPITAL_COMMUNITY): Payer: Self-pay | Admitting: Clinical

## 2017-02-27 ENCOUNTER — Ambulatory Visit (INDEPENDENT_AMBULATORY_CARE_PROVIDER_SITE_OTHER): Payer: Managed Care, Other (non HMO) | Admitting: Clinical

## 2017-02-27 DIAGNOSIS — F411 Generalized anxiety disorder: Secondary | ICD-10-CM | POA: Diagnosis not present

## 2017-02-27 DIAGNOSIS — F3181 Bipolar II disorder: Secondary | ICD-10-CM

## 2017-02-27 NOTE — Progress Notes (Signed)
   THERAPIST PROGRESS NOTE  Session Time: 8:00 - 8:55  Participation Level: Active  Behavioral Response: CasualAlertNA  Type of Therapy: Individual Therapy  Treatment Goals addressed: Improve Psychiatric Symptoms, emotional regulation skills (decreased angry outburst, improved self-esteem, improved relationships), unhelpful thought patterns, interpersonal relationship skills (decrease angry outburst, decrease resentment, decrease codependence), Stress management, improve self esteem, learn about diagnosis, healthy coping skills  Interventions: Motivational Interviewing,   Summary: Patricia Rivas is a 36 y.o. female who presents with Bipolar II, Generalized Anxiety Disorder   Suicidal/Homicidal: No -without intent/plan  Therapist Response:  Emma met with clinician for an individual session. Willadene shared about her psychiatric symptoms, her current life events and her homework. Charmayne shared that she had completed all her homework and has been diligently practicing all her skills. Client and clinician agreed she was ready to graduate from therapy. Client and clinician discussed her options should she like to return to therapy. Clinician asked open ended questions about the ways that Quinteria has grown and changed. Vianna shared that she no longer felt helpless or hopeless. Clinician asked open ended questions and Braxtyn identified the skills she learned in therapy and how she could apply them. She shared that since starting therapy all her relationships have improved and she no longer feels angry or stressed. She shared she feels more capable to handle even the most difficult situation.   Plan: Return in 1-2 weeks.  Diagnosis:     Axis I: Bipolar II, Generalized Anxiety Disorder     Allea Kassner A, LCSW 02/27/2017

## 2017-03-01 ENCOUNTER — Ambulatory Visit (INDEPENDENT_AMBULATORY_CARE_PROVIDER_SITE_OTHER): Payer: Managed Care, Other (non HMO) | Admitting: Psychiatry

## 2017-03-01 ENCOUNTER — Encounter (HOSPITAL_COMMUNITY): Payer: Self-pay | Admitting: Psychiatry

## 2017-03-01 DIAGNOSIS — F1910 Other psychoactive substance abuse, uncomplicated: Secondary | ICD-10-CM

## 2017-03-01 DIAGNOSIS — F411 Generalized anxiety disorder: Secondary | ICD-10-CM | POA: Diagnosis not present

## 2017-03-01 DIAGNOSIS — Z811 Family history of alcohol abuse and dependence: Secondary | ICD-10-CM

## 2017-03-01 DIAGNOSIS — Z79899 Other long term (current) drug therapy: Secondary | ICD-10-CM

## 2017-03-01 DIAGNOSIS — F17210 Nicotine dependence, cigarettes, uncomplicated: Secondary | ICD-10-CM

## 2017-03-01 DIAGNOSIS — F3281 Premenstrual dysphoric disorder: Secondary | ICD-10-CM | POA: Diagnosis not present

## 2017-03-01 DIAGNOSIS — Z818 Family history of other mental and behavioral disorders: Secondary | ICD-10-CM

## 2017-03-01 MED ORDER — CITALOPRAM HYDROBROMIDE 20 MG PO TABS: 20.0000 mg | ORAL_TABLET | Freq: Every day | ORAL | 0 refills | Status: DC

## 2017-03-01 MED ORDER — GABAPENTIN 300 MG PO CAPS: ORAL_CAPSULE | ORAL | 0 refills | Status: DC

## 2017-03-01 MED ORDER — LAMOTRIGINE 25 MG PO TABS
ORAL_TABLET | ORAL | 0 refills | Status: DC
Start: 2017-03-01 — End: 2017-06-05

## 2017-03-01 NOTE — Progress Notes (Signed)
BH MD/PA/NP OP Progress Note  03/01/2017 8:37 AM Patricia Rivas  MRN:  [id number]  Chief Complaint:  Chief Complaint    Follow-up     HPI: "I feel great".  Pt is doing the whole 30 diet and has lost 15 lbs.   Pt denies and drug and alcohol use. She continues to go to [employer] meetings and is a sponsor.   She has graduated from therapy and is using the coping skills that she learned.  Sleeping ok. Energy and appetite are good. 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  Pt denies depression. Denies anhedonia, isolation, crying spells, low motivation, poor hygiene, worthlessness and hopelessness. Denies SI/HI.  Denies manic and hypomanic symptoms including periods of decreased need for sleep, increased energy, mood lability, impulsivity, FOI, and excessive spending.  She denies panic attacks and denies any issues with panic.  Taking meds as prescribed and denies SE.    Visit Diagnosis:    ICD-9-CM ICD-10-CM   1. PMDD (premenstrual dysphoric disorder) 625.4 F32.81 lamoTRIgine (LAMICTAL) 25 MG tablet     citalopram (CELEXA) 20 MG tablet  2. GAD (generalized anxiety disorder) 300.02 F41.1 gabapentin (NEURONTIN) 300 MG capsule     citalopram (CELEXA) 20 MG tablet       Past Psychiatric History:  Anxiety: Yes Bipolar Disorder: No Depression: Yes Mania: No Psychosis: No Schizophrenia: No Personality Disorder: No Hospitalization for psychiatric illness: No History of Electroconvulsive Shock Therapy: No Prior Suicide Attempts: No   Past Medical History:  Past Medical History:  Diagnosis Date  . Alcohol dependence in remission ([name])   . Anxiety   . Depression   . 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 GERD (gastroesophageal reflux disease)   . Seasonal allergies   . Sleep apnea   . Thrombocytopenia ([name])     Past Surgical History:  Procedure Laterality Date  . WISDOM TOOTH EXTRACTION      Family Psychiatric and Medical History:  Family  History  Problem Relation Age of Onset  . Cancer Father        Prostate, colon  . Depression Father   . Cancer Maternal Aunt        Breast  . Depression Maternal Aunt   . Depression Mother   . Depression Paternal Aunt   . Depression Maternal Uncle   . Depression Paternal Uncle   . Bipolar disorder Maternal Grandmother   . Alcohol abuse Maternal Grandmother   . Bipolar disorder Cousin     Social History:  Social History   Social History  . Marital status: Married    Spouse name: N/A  . Number of children: N/A  . Years of education: N/A   Social History Main Topics  . Smoking status: Current Every Day Smoker    Packs/day: 0.25    Years: 15.00    Types: Cigarettes  . Smokeless tobacco: Never Used     Comment: Patient smoking 4 ciggs a week  . Alcohol use No     Comment: quit 11/2009  . Drug use: Yes    Types: Cocaine, Hydromorphone, Hydrocodone, Oxycodone, Benzodiazepines, Marijuana     Comment: quit all in 2009  . Sexual activity: Not Asked   Other Topics Concern  . None   Social History Narrative  . None    Allergies: No Known Allergies  Metabolic Disorder Labs: No results found for: HGBA1C, MPG No results found for: PROLACTIN No results found for: CHOL, TRIG, HDL,  CHOLHDL, VLDL, LDLCALC   Current Medications: Current Outpatient Prescriptions  Medication Sig Dispense Refill  . citalopram (CELEXA) 20 MG tablet Take 1 tablet (20 mg total) by mouth daily. 90 tablet 0  . gabapentin (NEURONTIN) 300 MG capsule TAKE 1 CAPSULE 4 TIMES     DAILY 30 capsule 0  . ibuprofen (ADVIL,MOTRIN) 600 MG tablet Take 1 tablet (600 mg total) by mouth every 6 (six) hours as needed for pain. 30 tablet 1  . lamoTRIgine (LAMICTAL) 25 MG tablet TAKE 3 TABLETS (=75MG )  DAILY 270 tablet 0  . norgestimate-ethinyl estradiol (ORTHO-CYCLEN,SPRINTEC,PREVIFEM) 0.25-35 MG-MCG tablet Take 1 tablet by mouth daily. Reported on 12/14/2015    . rosuvastatin (CRESTOR) 20 MG tablet Take 20 mg by mouth  daily.     No current facility-administered medications for this visit.     Musculoskeletal: Strength & Muscle Tone: within normal limits Gait & Station: normal Patient leans: N/A  Psychiatric Specialty Exam: Review of Systems  Gastrointestinal: Negative for abdominal pain, heartburn, nausea and vomiting.  Musculoskeletal: Negative for back pain, joint pain, myalgias and neck pain.  Neurological: Negative for dizziness, tingling, tremors, sensory change and headaches.  Psychiatric/Behavioral: Negative for depression, hallucinations, substance abuse and suicidal ideas. The patient is not nervous/anxious and does not have insomnia.     Blood pressure 132/88, pulse (!) 111, height 5\' 5"  (1.651 m), weight 240 lb 12.8 oz (109.2 kg), unknown if currently breastfeeding.Body mass index is 40.07 kg/m.  General Appearance: Fairly Groomed  Eye Contact:  Good  Speech:  Clear and Coherent and Normal Rate  Volume:  Normal  Mood:  Euthymic  Affect:  Full Range  Thought Process:  Goal Directed and Descriptions of Associations: Intact  Orientation:  Full (Time, Place, and Person)  Thought Content: WDL   Suicidal Thoughts:  No  Homicidal Thoughts:  No  Memory:  Immediate;   Good Recent;   Good Remote;   Good  Judgement:  Good  Insight:  Good  Psychomotor Activity:  Normal  Concentration:  Concentration: Good and Attention Span: Good  Recall:  Good  Fund of Knowledge: Good  Language: Good  Akathisia:  No  Handed:  Right  AIMS (if indicated):  n/a  Assets:  Communication Skills Desire for Improvement Housing Intimacy Leisure Time Physical Health Resilience Social Support  ADL's:  Intact  Cognition: WNL  Sleep:  good     Treatment Plan Summary:Medication management   Assessment: PMDD; GAD; Alcohol Dep in remission; Polysubstance abuse (opiates, THC, Benzo, Cocaine), r/o Bipolar II d/o    Medication management with supportive therapy. Risks/benefits and SE of the medication  discussed. Pt verbalized understanding and verbal consent obtained for treatment.  Affirm with the patient that the medications are taken as ordered. Patient expressed understanding of how their medications were to be used.   The risk of un-intended pregnancy is low based on the fact that pt reports she is  Using a daily OCP.  Pt is aware that these meds carry a teratogenic risk. Pt will discuss plan of action if she does or plans to become pregnant in the future.   Meds: Celexa 20mg  po qD for mood Lamictal 75mg  po qD for Bipolar disorder and mood augmentation Neurontin 300mg  po QID for off label treatment of anxiety   Labs: none  Therapy: brief supportive therapy provided. Discussed psychosocial stressors in detail.     Consultations:  None  Pt denies SI and is at an acute low risk for suicide. Patient told  to call clinic if any problems occur. Patient advised to go to ER if they should develop SI/HI, side effects, or if symptoms worsen. Has crisis numbers to call if needed. Pt verbalized understanding.  F/up in 3 months or sooner if needed     [name], MD 03/01/2017, 8:37 AM

## 2017-06-05 ENCOUNTER — Other Ambulatory Visit (HOSPITAL_COMMUNITY): Payer: Self-pay

## 2017-06-05 DIAGNOSIS — F3281 Premenstrual dysphoric disorder: Secondary | ICD-10-CM

## 2017-06-05 MED ORDER — LAMOTRIGINE 25 MG PO TABS: ORAL_TABLET | ORAL | 0 refills | Status: DC

## 2017-06-05 NOTE — Progress Notes (Signed)
Patient is requesting a 90 day refill on her Lamictal. Per protocol, patient was seen in May and has a follow up later this month (8/23) I sent the 90 day rx to [facility] per the patients request

## 2017-06-14 ENCOUNTER — Encounter (HOSPITAL_COMMUNITY): Payer: Self-pay | Admitting: Psychiatry

## 2017-06-14 ENCOUNTER — Ambulatory Visit (INDEPENDENT_AMBULATORY_CARE_PROVIDER_SITE_OTHER): Payer: Managed Care, Other (non HMO) | Admitting: Psychiatry

## 2017-06-14 DIAGNOSIS — F17210 Nicotine dependence, cigarettes, uncomplicated: Secondary | ICD-10-CM | POA: Diagnosis not present

## 2017-06-14 DIAGNOSIS — F411 Generalized anxiety disorder: Secondary | ICD-10-CM

## 2017-06-14 DIAGNOSIS — F1390 Sedative, hypnotic, or anxiolytic use, unspecified, uncomplicated: Secondary | ICD-10-CM

## 2017-06-14 DIAGNOSIS — F1490 Cocaine use, unspecified, uncomplicated: Secondary | ICD-10-CM | POA: Diagnosis not present

## 2017-06-14 DIAGNOSIS — F1290 Cannabis use, unspecified, uncomplicated: Secondary | ICD-10-CM | POA: Diagnosis not present

## 2017-06-14 DIAGNOSIS — Z818 Family history of other mental and behavioral disorders: Secondary | ICD-10-CM | POA: Diagnosis not present

## 2017-06-14 DIAGNOSIS — F3281 Premenstrual dysphoric disorder: Secondary | ICD-10-CM | POA: Diagnosis not present

## 2017-06-14 DIAGNOSIS — F1190 Opioid use, unspecified, uncomplicated: Secondary | ICD-10-CM

## 2017-06-14 DIAGNOSIS — Z811 Family history of alcohol abuse and dependence: Secondary | ICD-10-CM | POA: Diagnosis not present

## 2017-06-14 MED ORDER — CITALOPRAM HYDROBROMIDE 20 MG PO TABS: 20.0000 mg | ORAL_TABLET | Freq: Every day | ORAL | 0 refills | Status: DC

## 2017-06-14 MED ORDER — GABAPENTIN 300 MG PO CAPS: ORAL_CAPSULE | ORAL | 0 refills | Status: DC

## 2017-06-14 NOTE — Progress Notes (Signed)
BH MD/PA/NP OP Progress Note  06/14/2017 8:58 AM Patricia Rivas  MRN:  [id number]  Chief Complaint:  Chief Complaint    Follow-up     HPI: Pt quit smoking 9 days ago. She is doing well and states she has a good support system. She is proud of herself.   Pt denies all illicit drug use and alcohol use.   She is working on dieting and is going to try Whole 30 diet.  Sleep and appetite are good. Energy is improving.  Pt states she has about 1 day of feeling euphoric. She denies any impulsive behaviors during this time. Today she denies manic and hypomanic symptoms including periods of decreased need for sleep, increased energy, mood lability, impulsivity, FOI, and excessive spending.   Pt denies depression even around her period. She has had more physical symptoms of fatigue around that time. Denies anhedonia, isolation, crying spells, low motivation, poor hygiene, worthlessness and hopelessness. Denies SI/HI.  Anxiety is mostly ok. When she is stressed she has some panic attack like symptoms.  Taking meds as prescribed and denies SE.   Visit Diagnosis:    ICD-10-CM   1. GAD (generalized anxiety disorder) F41.1 citalopram (CELEXA) 20 MG tablet    gabapentin (NEURONTIN) 300 MG capsule  2. PMDD (premenstrual dysphoric disorder) F32.81 citalopram (CELEXA) 20 MG tablet        Past Psychiatric History:  Anxiety:Yes Bipolar Disorder:No Depression:Yes Mania:No Psychosis:No Schizophrenia:No Personality Disorder:No Hospitalization for psychiatric illness:No History of Electroconvulsive Shock Therapy:No Prior Suicide Attempts:No  Past Medical History:  Past Medical History:  Diagnosis Date  . Alcohol dependence in remission ([name])   . Anxiety   . Depression   . 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 GERD (gastroesophageal reflux disease)   . Seasonal allergies   . Sleep apnea   . Thrombocytopenia ([name])     Past Surgical History:  Procedure  Laterality Date  . WISDOM TOOTH EXTRACTION      Family Psychiatric and Medical History:  Family History  Problem Relation Age of Onset  . Cancer Father        Prostate, colon  . Depression Father   . Cancer Maternal Aunt        Breast  . Depression Maternal Aunt   . Depression Mother   . Depression Paternal Aunt   . Depression Maternal Uncle   . Depression Paternal Uncle   . Bipolar disorder Maternal Grandmother   . Alcohol abuse Maternal Grandmother   . Bipolar disorder Cousin     Social History:  Social History   Social History  . Marital status: Married    Spouse name: N/A  . Number of children: N/A  . Years of education: N/A   Social History Main Topics  . Smoking status: Current Every Day Smoker    Packs/day: 0.25    Years: 15.00    Types: Cigarettes  . Smokeless tobacco: Never Used     Comment: Patient smoking 4 ciggs a week  . Alcohol use No     Comment: quit 11/2009  . Drug use: Yes    Types: Cocaine, Hydromorphone, Hydrocodone, Oxycodone, Benzodiazepines, Marijuana     Comment: quit all in 2009  . Sexual activity: Not on file   Other Topics Concern  . Not on file   Social History Narrative  . No narrative on file    Allergies: No Known Allergies  Metabolic Disorder Labs: No results found for: HGBA1C, MPG No results  found for: PROLACTIN No results found for: CHOL, TRIG, HDL, CHOLHDL, VLDL, LDLCALC No results found for: TSH  Therapeutic Level Labs: Lab Results  Component Value Date   LITHIUM 0.40 (L) 03/09/2015   No results found for: VALPROATE No components found for:  CBMZ  Current Medications: Current Outpatient Prescriptions  Medication Sig Dispense Refill  . citalopram (CELEXA) 20 MG tablet Take 1 tablet (20 mg total) by mouth daily. 90 tablet 0  . gabapentin (NEURONTIN) 300 MG capsule TAKE 1 CAPSULE 4 TIMES     DAILY 360 capsule 0  . ibuprofen (ADVIL,MOTRIN) 600 MG tablet Take 1 tablet (600 mg total) by mouth every 6 (six) hours as  needed for pain. 30 tablet 1  . lamoTRIgine (LAMICTAL) 25 MG tablet TAKE 3 TABLETS (=75MG )  DAILY 270 tablet 0  . norgestimate-ethinyl estradiol (ORTHO-CYCLEN,SPRINTEC,PREVIFEM) 0.25-35 MG-MCG tablet Take 1 tablet by mouth daily. Reported on 12/14/2015    . rosuvastatin (CRESTOR) 20 MG tablet Take 20 mg by mouth daily.     No current facility-administered medications for this visit.      Musculoskeletal: Strength & Muscle Tone: within normal limits Gait & Station: normal Patient leans: N/A  Psychiatric Specialty Exam: Review of Systems  Neurological: Negative for dizziness, speech change and headaches.  Psychiatric/Behavioral: Negative for depression, hallucinations, substance abuse and suicidal ideas. The patient is nervous/anxious. The patient does not have insomnia.     unknown if currently breastfeeding.There is no height or weight on file to calculate BMI.  General Appearance: Fairly Groomed  Eye Contact:  Good  Speech:  Clear and Coherent and Normal Rate  Volume:  Normal  Mood:  Euthymic  Affect:  Full Range  Thought Process:  Goal Directed and Descriptions of Associations: Intact  Orientation:  Full (Time, Place, and Person)  Thought Content: Logical   Suicidal Thoughts:  No  Homicidal Thoughts:  No  Memory:  Immediate;   Good Recent;   Good Remote;   Good  Judgement:  Good  Insight:  Good  Psychomotor Activity:  Normal  Concentration:  Concentration: Good and Attention Span: Good  Recall:  Good  Fund of Knowledge: Good  Language: Good  Akathisia:  No  Handed:  Right  AIMS (if indicated): not done  Assets:  Communication Skills Desire for Improvement Financial Resources/Insurance Housing Intimacy Resilience Social Support Transportation  ADL's:  Intact  Cognition: WNL  Sleep:  Good    Assessment and Plan- Assessment: PMDD, GAD; Alcohol dep in remission; Polysubstance abuse (opiates, THC, Benzo, Cocaine); r/o Bipolar II disorder   Medication management  with supportive therapy. Risks/benefits and SE of the medication discussed. Pt verbalized understanding and verbal consent obtained for treatment.  Affirm with the patient that the medications are taken as ordered. Patient expressed understanding of how their medications were to be used.   The risk of un-intended pregnancy is low  based on the fact that pt reports she is using daily OCP Pt is aware that these meds carry a teratogenic risk. Pt will discuss plan of action if she does or plans to become pregnant in the future.  Meds: Celexa 20mg  po qD for mood Lamictal 75mg  po qD for Bipolar disorder and mood augmentation Neurontin 300mg  po QID for off label treatment of anxiety  Labs: none  Therapy: brief supportive therapy provided. Discussed psychosocial stressors in detail.     Consultations: none  Pt denies SI and is at an acute low risk for suicide. Patient told to call clinic if  any problems occur. Patient advised to go to ER if they should develop SI/HI, side effects, or if symptoms worsen. Has crisis numbers to call if needed. Pt verbalized understanding.  F/up in 3 months or sooner if needed   [name], MD 06/14/2017, 8:58 AM

## 2017-09-03 ENCOUNTER — Telehealth (HOSPITAL_COMMUNITY): Payer: Self-pay

## 2017-09-03 ENCOUNTER — Other Ambulatory Visit (HOSPITAL_COMMUNITY): Payer: Self-pay | Admitting: Psychiatry

## 2017-09-03 DIAGNOSIS — F3281 Premenstrual dysphoric disorder: Secondary | ICD-10-CM

## 2017-09-03 NOTE — Telephone Encounter (Signed)
Medication management - Fax received from patient's [facility] mail service pharmacy requesting refills of Celexa and Neurontin, both last filled with 90 day orders 06/14/17 and pt. does not return until 09/20/17

## 2017-09-05 ENCOUNTER — Other Ambulatory Visit (HOSPITAL_COMMUNITY): Payer: Self-pay

## 2017-09-05 DIAGNOSIS — F3281 Premenstrual dysphoric disorder: Secondary | ICD-10-CM

## 2017-09-05 MED ORDER — LAMOTRIGINE 25 MG PO TABS: ORAL_TABLET | ORAL | 0 refills | Status: DC

## 2017-09-06 NOTE — Telephone Encounter (Signed)
Medication management - Telephone call with patient pior to sending in 2 week orders for Celexa and Neurontin as patient reported she had enough of these and did not need order prior to coming back in for her appointment scheduled 09/20/17.  Patient stated Lamictal was the only medication needing a refill prior to appointment and has that now so is good until she returns.

## 2017-09-06 NOTE — Telephone Encounter (Signed)
Yes we can refill for 2 weeks

## 2017-09-20 ENCOUNTER — Ambulatory Visit (INDEPENDENT_AMBULATORY_CARE_PROVIDER_SITE_OTHER): Payer: 59 | Admitting: Psychiatry

## 2017-09-20 ENCOUNTER — Encounter (HOSPITAL_COMMUNITY): Payer: Self-pay | Admitting: Psychiatry

## 2017-09-20 DIAGNOSIS — Z818 Family history of other mental and behavioral disorders: Secondary | ICD-10-CM

## 2017-09-20 DIAGNOSIS — F1911 Other psychoactive substance abuse, in remission: Secondary | ICD-10-CM

## 2017-09-20 DIAGNOSIS — F411 Generalized anxiety disorder: Secondary | ICD-10-CM | POA: Diagnosis not present

## 2017-09-20 DIAGNOSIS — F3281 Premenstrual dysphoric disorder: Secondary | ICD-10-CM

## 2017-09-20 DIAGNOSIS — Z811 Family history of alcohol abuse and dependence: Secondary | ICD-10-CM

## 2017-09-20 DIAGNOSIS — F1021 Alcohol dependence, in remission: Secondary | ICD-10-CM | POA: Diagnosis not present

## 2017-09-20 DIAGNOSIS — Z79899 Other long term (current) drug therapy: Secondary | ICD-10-CM

## 2017-09-20 DIAGNOSIS — Z720 Tobacco use: Secondary | ICD-10-CM

## 2017-09-20 MED ORDER — CITALOPRAM HYDROBROMIDE 20 MG PO TABS: 20.0000 mg | ORAL_TABLET | Freq: Every day | ORAL | 0 refills | Status: DC

## 2017-09-20 MED ORDER — LAMOTRIGINE 100 MG PO TABS: ORAL_TABLET | ORAL | 0 refills | Status: DC

## 2017-09-20 MED ORDER — GABAPENTIN 300 MG PO CAPS: ORAL_CAPSULE | ORAL | 0 refills | Status: DC

## 2017-09-20 NOTE — Progress Notes (Signed)
BH MD/PA/NP OP Progress Note  09/20/2017 8:48 AM Patricia Rivas  MRN:  [id number]  Chief Complaint:  Chief Complaint    Follow-up     HPI: Pt states last month was really hard. She had euphoria for 2 full days about 2 weeks prior to her period. During that time she is elevated and energetic and "super connected to God". She does not over spend but does like to spend more money. It feels restless but not irritable. Her sleep is not affected. It feels artificial and she doesn't like it. Most episodes last 1-2 days.   She then  "dropped" into depression. She was having low motivation, sadness, some fatigue, finding now joy in anything. The depression part lasted about one week. After that all symptoms resolved. This month she is not experiencing any mood symptoms related to her period.   Now she denies depression, anhedonia, hopelessness and SI/HI. She is sleeping about 8 hrs/night.   Pt denies any illicit substance use and alcohol use.   Pt denies any recent panic attacks. She states anxiety seems to be resolved.   Pt states-taking meds as prescribed and denies SE.   Visit Diagnosis:    ICD-10-CM   1. GAD (generalized anxiety disorder) F41.1 gabapentin (NEURONTIN) 300 MG capsule    citalopram (CELEXA) 20 MG tablet  2. PMDD (premenstrual dysphoric disorder) F32.81 citalopram (CELEXA) 20 MG tablet    lamoTRIgine (LAMICTAL) 100 MG tablet       Past Psychiatric History:  Anxiety: Yes Bipolar Disorder: No Depression: Yes Mania: No Psychosis: No Schizophrenia: No Personality Disorder: No Hospitalization for psychiatric illness: No History of Electroconvulsive Shock Therapy: No Prior Suicide Attempts: No   Past Medical History:  Past Medical History:  Diagnosis Date  . Alcohol dependence in remission ([name])   . Anxiety   . Depression   . 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 GERD (gastroesophageal reflux disease)   . Seasonal allergies   . Sleep apnea   .  Thrombocytopenia ([name])     Past Surgical History:  Procedure Laterality Date  . WISDOM TOOTH EXTRACTION      Family Psychiatric History:   Family History  Problem Relation Age of Onset  . Cancer Father        Prostate, colon  . Depression Father   . Cancer Maternal Aunt        Breast  . Depression Maternal Aunt   . Depression Mother   . Depression Paternal Aunt   . Depression Maternal Uncle   . Depression Paternal Uncle   . Bipolar disorder Maternal Grandmother   . Alcohol abuse Maternal Grandmother   . Bipolar disorder Cousin     Social History:  Social History   Socioeconomic History  . Marital status: Married    Spouse name: None  . Number of children: None  . Years of education: None  . Highest education level: None  Social Needs  . Financial resource strain: None  . Food insecurity - worry: None  . Food insecurity - inability: None  . Transportation needs - medical: None  . Transportation needs - non-medical: None  Occupational History  . None  Tobacco Use  . Smoking status: Former Smoker    Packs/day: 0.25    Years: 15.00    Pack years: 3.75    Types: Cigarettes    Last attempt to quit: 06/05/2017    Years since quitting: 0.2  . Smokeless tobacco: Never Used  .  Tobacco comment: Patient smoking 4 ciggs a week  Substance and Sexual Activity  . Alcohol use: No    Alcohol/week: 0.0 oz    Comment: quit 11/2009  . Drug use: Yes    Types: Cocaine, Hydromorphone, Hydrocodone, Oxycodone, Benzodiazepines, Marijuana    Comment: quit all in 2009  . Sexual activity: None  Other Topics Concern  . None  Social History Narrative  . None    Allergies: No Known Allergies  Metabolic Disorder Labs: No results found for: HGBA1C, MPG No results found for: PROLACTIN No results found for: CHOL, TRIG, HDL, CHOLHDL, VLDL, LDLCALC No results found for: TSH  Therapeutic Level Labs: Lab Results  Component Value Date   LITHIUM 0.40 (L) 03/09/2015   No results  found for: VALPROATE No components found for:  CBMZ  Current Medications: Current Outpatient Medications  Medication Sig Dispense Refill  . citalopram (CELEXA) 20 MG tablet Take 1 tablet (20 mg total) by mouth daily. 90 tablet 0  . gabapentin (NEURONTIN) 300 MG capsule TAKE 1 CAPSULE 4 TIMES     DAILY 360 capsule 0  . ibuprofen (ADVIL,MOTRIN) 600 MG tablet Take 1 tablet (600 mg total) by mouth every 6 (six) hours as needed for pain. 30 tablet 1  . lamoTRIgine (LAMICTAL) 25 MG tablet TAKE 3 TABLETS (=75MG )  DAILY 90 tablet 0  . norgestimate-ethinyl estradiol (ORTHO-CYCLEN,SPRINTEC,PREVIFEM) 0.25-35 MG-MCG tablet Take 1 tablet by mouth daily. Reported on 12/14/2015    . rosuvastatin (CRESTOR) 20 MG tablet Take 20 mg by mouth daily.     No current facility-administered medications for this visit.      Musculoskeletal: Strength & Muscle Tone: within normal limits Gait & Station: normal Patient leans: N/A  Psychiatric Specialty Exam: Review of Systems  Constitutional: Negative for chills and fever.  HENT: Negative for congestion, nosebleeds, sinus pain and sore throat.   Neurological: Negative for weakness.    Blood pressure 132/78, pulse 60, resp. rate 12, height 5\' 5"  (1.651 m), weight 256 lb 9.6 oz (116.4 kg), unknown if currently breastfeeding.Body mass index is 42.7 kg/m.  General Appearance: Fairly Groomed  Eye Contact:  Good  Speech:  Clear and Coherent and Normal Rate  Volume:  Normal  Mood:  Euthymic  Affect:  Full Range  Thought Process:  Goal Directed and Descriptions of Associations: Intact  Orientation:  Full (Time, Place, and Person)  Thought Content: Logical   Suicidal Thoughts:  No  Homicidal Thoughts:  No  Memory:  Immediate;   Good Recent;   Good Remote;   Good  Judgement:  Good  Insight:  Good  Psychomotor Activity:  Normal  Concentration:  Concentration: Good and Attention Span: Good  Recall:  Good  Fund of Knowledge: Good  Language: Good  Akathisia:   No  Handed:  Right  AIMS (if indicated): not done  Assets:  Communication Skills Desire for Improvement Financial Resources/Insurance Housing  ADL's:  Intact  Cognition: WNL  Sleep:  Good   Screenings:   Assessment and Plan: PMDD, GAD; Alcohol dep in remission; Polysubstance abuse (opiates, THC, Benzo, Cocaine); r/o Bipolar II disorder    Medication management with supportive therapy. Risks/benefits and SE of the medication discussed. Pt verbalized understanding and verbal consent obtained for treatment.  Affirm with the patient that the medications are taken as ordered. Patient expressed understanding of how their medications were to be used.   The risk of un-intended pregnancy is low based on the fact that pt reports she is using daily  OCP. Pt is aware that these meds carry a teratogenic risk. Pt will discuss plan of action if she does or plans to become pregnant in the future.   Meds: Celexa 20mg  po qD for mood and anxiety Increase Lamictal 100mg  po qD for Bipolar and mood issues Neurontin 300mg  po QID for off label treatment of anxiety   Labs: none  Therapy: brief supportive therapy provided. Discussed psychosocial stressors in detail.     Consultations: none  Pt denies SI and is at an acute low risk for suicide. Patient told to call clinic if any problems occur. Patient advised to go to ER if they should develop SI/HI, side effects, or if symptoms worsen. Has crisis numbers to call if needed. Pt verbalized understanding.  F/up in 3 months or sooner if needed   Oletta DarterSalina Deedee Lybarger, MD 09/20/2017, 8:48 AM

## 2017-10-02 ENCOUNTER — Other Ambulatory Visit (HOSPITAL_COMMUNITY): Payer: Self-pay

## 2017-10-02 DIAGNOSIS — F3281 Premenstrual dysphoric disorder: Secondary | ICD-10-CM

## 2017-10-02 MED ORDER — LAMOTRIGINE 100 MG PO TABS: ORAL_TABLET | ORAL | 0 refills | Status: DC

## 2017-12-20 ENCOUNTER — Encounter (HOSPITAL_COMMUNITY): Payer: Self-pay | Admitting: Psychiatry

## 2017-12-20 ENCOUNTER — Ambulatory Visit (INDEPENDENT_AMBULATORY_CARE_PROVIDER_SITE_OTHER): Payer: 59 | Admitting: Psychiatry

## 2017-12-20 DIAGNOSIS — F1021 Alcohol dependence, in remission: Secondary | ICD-10-CM

## 2017-12-20 DIAGNOSIS — F3281 Premenstrual dysphoric disorder: Secondary | ICD-10-CM

## 2017-12-20 DIAGNOSIS — F1110 Opioid abuse, uncomplicated: Secondary | ICD-10-CM | POA: Diagnosis not present

## 2017-12-20 DIAGNOSIS — F17210 Nicotine dependence, cigarettes, uncomplicated: Secondary | ICD-10-CM

## 2017-12-20 DIAGNOSIS — Z8659 Personal history of other mental and behavioral disorders: Secondary | ICD-10-CM | POA: Diagnosis not present

## 2017-12-20 DIAGNOSIS — F1210 Cannabis abuse, uncomplicated: Secondary | ICD-10-CM | POA: Diagnosis not present

## 2017-12-20 DIAGNOSIS — F1310 Sedative, hypnotic or anxiolytic abuse, uncomplicated: Secondary | ICD-10-CM

## 2017-12-20 DIAGNOSIS — F411 Generalized anxiety disorder: Secondary | ICD-10-CM | POA: Diagnosis not present

## 2017-12-20 DIAGNOSIS — F1410 Cocaine abuse, uncomplicated: Secondary | ICD-10-CM | POA: Diagnosis not present

## 2017-12-20 DIAGNOSIS — R454 Irritability and anger: Secondary | ICD-10-CM | POA: Diagnosis not present

## 2017-12-20 DIAGNOSIS — Z818 Family history of other mental and behavioral disorders: Secondary | ICD-10-CM

## 2017-12-20 MED ORDER — CITALOPRAM HYDROBROMIDE 20 MG PO TABS: 20.0000 mg | ORAL_TABLET | Freq: Every day | ORAL | 0 refills | Status: DC

## 2017-12-20 MED ORDER — LAMOTRIGINE 100 MG PO TABS: ORAL_TABLET | ORAL | 0 refills | Status: DC

## 2017-12-20 MED ORDER — GABAPENTIN 300 MG PO CAPS: ORAL_CAPSULE | ORAL | 0 refills | Status: DC

## 2017-12-20 NOTE — Progress Notes (Signed)
BH MD/PA/NP OP Progress Note  12/20/2017 9:33 AM Patricia Rivas  MRN:  [id number]  Chief Complaint:  Chief Complaint    Anxiety; Follow-up     HPI: "I want to get tested for ADD. I have a hell of time focusing". Pt is easily distracted and often finds herself jumping from project to project. She loses focus in conversation. It's affecting her productivity at work. She states it has been going for most of her life but now seems to worse. Pt states she was diagnosed with ADHD in elementary school. She was treated with Ritalin but didn't take it after elementary.  Pt feels her mood is stable around her period since increasing Lamictal. Pt reports no euphoria or "crashes" while on her period. She is having some irritability but it is well controlled. Pt denies depression, anhedonia and crying spells. Pt states sleep is good. Pt denies SI/HI.  Anxiety is "fine". She can't recall the last time she was anxious.  Pt is no longer having panic attacks since starting Neurontin. 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  Pt states-taking meds as prescribed and denies SE.   Visit Diagnosis:    ICD-10-CM   1. GAD (generalized anxiety disorder) F41.1 citalopram (CELEXA) 20 MG tablet    gabapentin (NEURONTIN) 300 MG capsule  2. PMDD (premenstrual dysphoric disorder) F32.81 citalopram (CELEXA) 20 MG tablet    lamoTRIgine (LAMICTAL) 100 MG tablet      Past Psychiatric History:  Anxiety: Yes Bipolar Disorder: No Depression: Yes Mania: No Psychosis: No Schizophrenia: No Personality Disorder: No Hospitalization for psychiatric illness: No History of Electroconvulsive Shock Therapy: No Prior Suicide Attempts: No   Past Medical History:  Past Medical History:  Diagnosis Date  . Alcohol dependence in remission ([name])   . Anxiety   . Depression   . 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 GERD (gastroesophageal reflux disease)   . Seasonal allergies   . Sleep apnea   . Thrombocytopenia  ([name])     Past Surgical History:  Procedure Laterality Date  . WISDOM TOOTH EXTRACTION      Family Psychiatric History:  Family History  Problem Relation Age of Onset  . Cancer Father        Prostate, colon  . Depression Father   . Cancer Maternal Aunt        Breast  . Depression Maternal Aunt   . Depression Mother   . Depression Paternal Aunt   . Depression Maternal Uncle   . Depression Paternal Uncle   . Bipolar disorder Maternal Grandmother   . Alcohol abuse Maternal Grandmother   . Bipolar disorder Cousin     Social History:  Social History   Socioeconomic History  . Marital status: Married    Spouse name: None  . Number of children: None  . Years of education: None  . Highest education level: None  Social Needs  . Financial resource strain: None  . Food insecurity - worry: None  . Food insecurity - inability: None  . Transportation needs - medical: None  . Transportation needs - non-medical: None  Occupational History  . None  Tobacco Use  . Smoking status: Former Smoker    Packs/day: 0.25    Years: 15.00    Pack years: 3.75    Types: Cigarettes    Last attempt to quit: 06/05/2017    Years since quitting: 0.5  . Smokeless tobacco: Never Used  . Tobacco comment: Patient smoking 4 ciggs a week  Substance and Sexual Activity  . Alcohol use: No    Alcohol/week: 0.0 oz    Comment: quit 11/2009  . Drug use: Yes    Types: Cocaine, Hydromorphone, Hydrocodone, Oxycodone, Benzodiazepines, Marijuana    Comment: quit all in 2009  . Sexual activity: None  Other Topics Concern  . None  Social History Narrative  . None    Allergies: No Known Allergies  Metabolic Disorder Labs: No results found for: HGBA1C, MPG No results found for: PROLACTIN No results found for: CHOL, TRIG, HDL, CHOLHDL, VLDL, LDLCALC No results found for: TSH  Therapeutic Level Labs: Lab Results  Component Value Date   LITHIUM 0.40 (L) 03/09/2015   No results found for:  VALPROATE No components found for:  CBMZ  Current Medications: Current Outpatient Medications  Medication Sig Dispense Refill  . citalopram (CELEXA) 20 MG tablet Take 1 tablet (20 mg total) by mouth daily. 90 tablet 0  . gabapentin (NEURONTIN) 300 MG capsule TAKE 1 CAPSULE 4 TIMES     DAILY 360 capsule 0  . ibuprofen (ADVIL,MOTRIN) 600 MG tablet Take 1 tablet (600 mg total) by mouth every 6 (six) hours as needed for pain. 30 tablet 1  . lamoTRIgine (LAMICTAL) 100 MG tablet Take one tablet by mouth daily 90 tablet 0  . norgestimate-ethinyl estradiol (ORTHO-CYCLEN,SPRINTEC,PREVIFEM) 0.25-35 MG-MCG tablet Take 1 tablet by mouth daily. Reported on 12/14/2015    . rosuvastatin (CRESTOR) 20 MG tablet Take 20 mg by mouth daily.     No current facility-administered medications for this visit.      Musculoskeletal: Strength & Muscle Tone: within normal limits Gait & Station: normal Patient leans: N/A  Psychiatric Specialty Exam: Review of Systems  Constitutional: Negative for chills, fever and weight loss.  HENT: Negative for ear discharge, ear pain, nosebleeds, sinus pain and sore throat.   Neurological: Negative for weakness.    Blood pressure 130/76, pulse 84, height 5\' 5"  (1.651 m), weight 263 lb (119.3 kg), unknown if currently breastfeeding.Body mass index is 43.77 kg/m.  General Appearance: Fairly Groomed  Eye Contact:  Good  Speech:  Clear and Coherent and Normal Rate  Volume:  Normal  Mood:  Euthymic  Affect:  Full Range  Thought Process:  Goal Directed and Descriptions of Associations: Intact  Orientation:  Full (Time, Place, and Person)  Thought Content: Logical   Suicidal Thoughts:  No  Homicidal Thoughts:  No  Memory:  Immediate;   Good Recent;   Good Remote;   Good  Judgement:  Good  Insight:  Good  Psychomotor Activity:  Normal  Concentration:  Concentration: Good and Attention Span: Good  Recall:  Good  Fund of Knowledge: Good  Language: Good  Akathisia:  No   Handed:  Right  AIMS (if indicated): not done  Assets:  Communication Skills Desire for Improvement Financial Resources/Insurance Housing Social Support Transportation Vocational/Educational  ADL's:  Intact  Cognition: WNL  Sleep:  Good   Screenings:   Assessment and Plan: PMDD, GAD, alcohol dependence in remission, polysubstance abuse- opiates, marijuana, benzos, cocaine in remission; rule out bipolar 2 disorder; hx of ADHD    Medication management with supportive therapy. Risks and benefits, side effects and alternative treatment options discussed with patient. Pt was given an opportunity to ask questions about medication, illness, and treatment. All current psychiatric medications have been reviewed and discussed with the patient and adjusted as clinically appropriate. The patient has been provided an accurate and updated list of the medications being now prescribed.  Patient expressed understanding of how their medications were to be used.  Pt verbalized understanding and verbal consent obtained for treatment.  Status of current problems: improving  Meds: Celexa 20 mg p.o. daily for mood and anxiety Lamictal 100 mg p.o. daily for bipolar disorder and PMDD Neurontin 300 mg p.o. 4 times daily for off label treatment of GAD   Labs: none  Therapy: brief supportive therapy provided. Discussed psychosocial stressors in detail.   Pt is now 8 yrs in remission.   Consultations: pt will obtain old school records  Pt denies SI and is at an acute low risk for suicide. Patient told to call clinic if any problems occur. Patient advised to go to ER if they should develop SI/HI, side effects, or if symptoms worsen. Has crisis numbers to call if needed. Pt verbalized understanding.  F/up in 3 months or sooner if needed   Oletta DarterSalina Dalante Minus, MD 12/20/2017, 9:33 AM

## 2018-01-01 ENCOUNTER — Other Ambulatory Visit (HOSPITAL_COMMUNITY): Payer: Self-pay | Admitting: Psychiatry

## 2018-01-01 DIAGNOSIS — F3281 Premenstrual dysphoric disorder: Secondary | ICD-10-CM

## 2018-03-21 ENCOUNTER — Ambulatory Visit (INDEPENDENT_AMBULATORY_CARE_PROVIDER_SITE_OTHER): Payer: 59 | Admitting: Psychiatry

## 2018-03-21 ENCOUNTER — Encounter (HOSPITAL_COMMUNITY): Payer: Self-pay | Admitting: Psychiatry

## 2018-03-21 DIAGNOSIS — F411 Generalized anxiety disorder: Secondary | ICD-10-CM

## 2018-03-21 DIAGNOSIS — F3281 Premenstrual dysphoric disorder: Secondary | ICD-10-CM | POA: Diagnosis not present

## 2018-03-21 MED ORDER — LAMOTRIGINE 100 MG PO TABS: ORAL_TABLET | ORAL | 0 refills | Status: DC

## 2018-03-21 MED ORDER — GABAPENTIN 300 MG PO CAPS: ORAL_CAPSULE | ORAL | 0 refills | Status: DC

## 2018-03-21 MED ORDER — CITALOPRAM HYDROBROMIDE 20 MG PO TABS: 20.0000 mg | ORAL_TABLET | Freq: Every day | ORAL | 0 refills | Status: DC

## 2018-03-21 NOTE — Progress Notes (Signed)
BH MD/PA/NP OP Progress Note  03/21/2018 8:42 AM Patricia Rivas  MRN:  [id number]  Chief Complaint:  Chief Complaint    Follow-up     HPI: She reports that she is doing well.  She has a number of trips planned for this summer and is looking forward to them.  She denies any overwhelming stressors at this point.  Patient states that she remains sober.  She is very active in [place] and is sponsoring many women.  Patient states her anxiety is well controlled.  She had a day in April where she was having palpitations.  She went and had to 2 EKGs done at [facility] walk[facility]in [facility].  She was told that both of them were normal.  She states that she has not had any other symptoms since then.  Patient states she cannot recall the last time she had a panic attack. Has not really noticed her anxiety much.  Patient denies depression, anhedonia, isolation.  She denies SI/HI.  She states that around the time of her.  She has some irritability and poor focus but other than that she feels that her symptoms are now "normal period symptoms".  She denies any hypomanic or euphoric-like symptoms.  She does have some fatigue around that time.  Sleep in general is good and she is using her CPAP each night.  Patient is taking her medications as prescribed and denies side effects.  Visit Diagnosis:    ICD-10-CM   1. GAD (generalized anxiety disorder) F41.1 citalopram (CELEXA) 20 MG tablet    gabapentin (NEURONTIN) 300 MG capsule  2. PMDD (premenstrual dysphoric disorder) F32.81 citalopram (CELEXA) 20 MG tablet    lamoTRIgine (LAMICTAL) 100 MG tablet      Past Psychiatric History:  Anxiety:Yes Bipolar Disorder:No Depression:Yes Mania:No Psychosis:No Schizophrenia:No Personality Disorder:No Hospitalization for psychiatric illness:No History of Electroconvulsive Shock Therapy:No Prior Suicide Attempts:No    Past Medical History:  Past Medical History:  Diagnosis Date  . Alcohol  dependence in remission ([name])   . Anxiety   . Depression   . 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 GERD (gastroesophageal reflux disease)   . Seasonal allergies   . Sleep apnea   . Thrombocytopenia ([name])     Past Surgical History:  Procedure Laterality Date  . WISDOM TOOTH EXTRACTION      Family Psychiatric History:  Family History  Problem Relation Age of Onset  . Cancer Father        Prostate, colon  . Depression Father   . Cancer Maternal Aunt        Breast  . Depression Maternal Aunt   . Depression Mother   . Depression Paternal Aunt   . Depression Maternal Uncle   . Depression Paternal Uncle   . Bipolar disorder Maternal Grandmother   . Alcohol abuse Maternal Grandmother   . Bipolar disorder Cousin     Social History:  Social History   Socioeconomic History  . Marital status: Married    Spouse name: Not on file  . Number of children: Not on file  . Years of education: Not on file  . Highest education level: Not on file  Occupational History  . Not on file  Social Needs  . Financial resource strain: Not on file  . Food insecurity:    Worry: Not on file    Inability: Not on file  . Transportation needs:    Medical: Not on file    Non-medical: Not on  file  Tobacco Use  . Smoking status: Former Smoker    Packs/day: 0.25    Years: 15.00    Pack years: 3.75    Types: Cigarettes    Last attempt to quit: 06/05/2017    Years since quitting: 0.7  . Smokeless tobacco: Never Used  . Tobacco comment: Patient smoking 4 ciggs a week  Substance and Sexual Activity  . Alcohol use: No    Alcohol/week: 0.0 oz    Comment: quit 11/2009  . Drug use: Yes    Types: Cocaine, Hydromorphone, Hydrocodone, Oxycodone, Benzodiazepines, Marijuana    Comment: quit all in 2009  . Sexual activity: Not on file  Lifestyle  . Physical activity:    Days per week: Not on file    Minutes per session: Not on file  . Stress: Not on file  Relationships  . Social  connections:    Talks on phone: Not on file    Gets together: Not on file    Attends religious service: Not on file    Active member of club or organization: Not on file    Attends meetings of clubs or organizations: Not on file    Relationship status: Not on file  Other Topics Concern  . Not on file  Social History Narrative  . Not on file    Allergies: No Known Allergies  Metabolic Disorder Labs: No results found for: HGBA1C, MPG No results found for: PROLACTIN No results found for: CHOL, TRIG, HDL, CHOLHDL, VLDL, LDLCALC No results found for: TSH  Therapeutic Level Labs: Lab Results  Component Value Date   LITHIUM 0.40 (L) 03/09/2015   No results found for: VALPROATE No components found for:  CBMZ  Current Medications: Current Outpatient Medications  Medication Sig Dispense Refill  . citalopram (CELEXA) 20 MG tablet Take 1 tablet (20 mg total) by mouth daily. 90 tablet 0  . gabapentin (NEURONTIN) 300 MG capsule TAKE 1 CAPSULE 4 TIMES     DAILY 360 capsule 0  . ibuprofen (ADVIL,MOTRIN) 600 MG tablet Take 1 tablet (600 mg total) by mouth every 6 (six) hours as needed for pain. 30 tablet 1  . lamoTRIgine (LAMICTAL) 100 MG tablet Take one tablet by mouth daily 90 tablet 0  . norgestimate-ethinyl estradiol (ORTHO-CYCLEN,SPRINTEC,PREVIFEM) 0.25-35 MG-MCG tablet Take 1 tablet by mouth daily. Reported on 12/14/2015    . rosuvastatin (CRESTOR) 20 MG tablet Take 20 mg by mouth daily.     No current facility-administered medications for this visit.      Musculoskeletal: Strength & Muscle Tone: within normal limits Gait & Station: normal Patient leans: N/A  Psychiatric Specialty Exam: Review of Systems  Cardiovascular: Negative for chest pain, palpitations and leg swelling.    Blood pressure 121/87, pulse 66, height  (1.651 m), weight 258 lb 6.4 oz (117.2 kg), SpO2 97 %, unknown if currently breastfeeding.Body mass index is 43 kg/m.  General Appearance: Fairly Groomed   Eye Contact:  Good  Speech:  Clear and Coherent and Normal Rate  Volume:  Normal  Mood:  Euthymic  Affect:  Full Range  Thought Process:  Goal Directed and Descriptions of Associations: Intact  Orientation:  Full (Time, Place, and Person)  Thought Content: Logical   Suicidal Thoughts:  No  Homicidal Thoughts:  No  Memory:  Immediate;   Good Recent;   Good Remote;   Good  Judgement:  Good  Insight:  Good  Psychomotor Activity:  Normal  Concentration:  Concentration: Good and Attention Span:  Good  Recall:  Good  Fund of Knowledge: Good  Language: Good  Akathisia:  No  Handed:  Right  AIMS (if indicated): not done  Assets:  Communication Skills Desire for Improvement Financial Resources/Insurance Housing Intimacy Leisure Time Resilience Social Support Transportation Vocational/Educational  ADL's:  Intact  Cognition: WNL  Sleep:  Good   Screenings:   Assessment and Plan: PMDD; GAD; alcohol dependence in remission; polysubstance abuse/opiates, marijuana, benzos, cocaine in remission; rule out bipolar 2 disorder; history of ADHD    Medication management with supportive therapy. Risks and benefits, side effects and alternative treatment options discussed with patient. Pt was given an opportunity to ask questions about medication, illness, and treatment. All current psychiatric medications have been reviewed and discussed with the patient and adjusted as clinically appropriate. The patient has been provided an accurate and updated list of the medications being now prescribed. Patient expressed understanding of how their medications were to be used.  Pt verbalized understanding and verbal consent obtained for treatment.  The risk of un-intended pregnancy is low based on the fact that pt reports she is taking OCP. Pt is aware that these meds carry a teratogenic risk. Pt will discuss plan of action if she does or plans to become pregnant in the future.  Status of current  problems: stable  Meds: Celexa 20 mg p.o. daily for PMDD and GAD Lamictal 100 mg p.o. daily for PMDD and potential bipolar disorder Neurontin 300 mg p.o. 4 times daily for off label treatment of GAD Due to history of polysubstance abuse will avoid all scheduled medications when possible  Labs: in April 2019 she had an EKG at [facility] and was told everything was fine. Will request records  Therapy: brief supportive therapy provided. Discussed psychosocial stressors in detail.    Consultations: Encouraged to follow up with PCP as needed Encouraged pt to continue AA meetings and validated her continued sobriety  Pt denies SI and is at an acute low risk for suicide. Patient told to call clinic if any problems occur. Patient advised to go to ER if they should develop SI/HI, side effects, or if symptoms worsen. Has crisis numbers to call if needed. Pt verbalized understanding.  F/up in 3 months or sooner if needed  The duration of this appointment visit was 20 minutes of face-to-face time with the patient.  Greater than 50% of this time was spent in counseling, explanation of  diagnosis, planning of further management, and coordination of care    [name], MD 03/21/2018, 8:42 AM

## 2018-06-27 ENCOUNTER — Encounter (HOSPITAL_COMMUNITY): Payer: Self-pay | Admitting: Psychiatry

## 2018-06-27 ENCOUNTER — Ambulatory Visit (INDEPENDENT_AMBULATORY_CARE_PROVIDER_SITE_OTHER): Payer: 59 | Admitting: Psychiatry

## 2018-06-27 DIAGNOSIS — F411 Generalized anxiety disorder: Secondary | ICD-10-CM

## 2018-06-27 DIAGNOSIS — Z811 Family history of alcohol abuse and dependence: Secondary | ICD-10-CM

## 2018-06-27 DIAGNOSIS — F3281 Premenstrual dysphoric disorder: Secondary | ICD-10-CM

## 2018-06-27 DIAGNOSIS — Z818 Family history of other mental and behavioral disorders: Secondary | ICD-10-CM | POA: Diagnosis not present

## 2018-06-27 DIAGNOSIS — F17210 Nicotine dependence, cigarettes, uncomplicated: Secondary | ICD-10-CM

## 2018-06-27 MED ORDER — LAMOTRIGINE 100 MG PO TABS: ORAL_TABLET | ORAL | 0 refills | Status: DC

## 2018-06-27 MED ORDER — CITALOPRAM HYDROBROMIDE 20 MG PO TABS: 20.0000 mg | ORAL_TABLET | Freq: Every day | ORAL | 0 refills | Status: DC

## 2018-06-27 MED ORDER — GABAPENTIN 300 MG PO CAPS: ORAL_CAPSULE | ORAL | 0 refills | Status: DC

## 2018-06-27 NOTE — Progress Notes (Signed)
BH MD/PA/NP OP Progress Note  06/27/2018 11:56 AM Patricia Rivas  MRN:  [id number]  Chief Complaint:  Chief Complaint    Follow-up; Medication Refill     HPI: Patient states that she is overall doing well except for some stressors related to her father and her weight.  She expects to have her menstrual cycle begin next week.  The Lamictal really helps to control her mood during that time.  She may be more irritable but overall is better than before.  This week she has been experiencing some anxiety-like symptoms but states that they are not panic attacks.  She denies any depression or anhedonia.  She denies SI/HI.  Pt denies recent manic and hypomanic symptoms including periods of decreased need for sleep, increased energy, mood lability, impulsivity, FOI, and excessive spending.  He expressed a lot of frustration today about her weight.  She states that she tends to eat her feelings and really wants help due to a number of stressors that are causing some anxiety.   Visit Diagnosis:    ICD-10-CM   1. GAD (generalized anxiety disorder) F41.1 citalopram (CELEXA) 20 MG tablet    gabapentin (NEURONTIN) 300 MG capsule  2. PMDD (premenstrual dysphoric disorder) F32.81 citalopram (CELEXA) 20 MG tablet    lamoTRIgine (LAMICTAL) 100 MG tablet      Past Psychiatric History:  Anxiety:Yes Bipolar Disorder:No Depression:Yes Mania:No Psychosis:No Schizophrenia:No Personality Disorder:No Hospitalization for psychiatric illness:No History of Electroconvulsive Shock Therapy:No Prior Suicide Attempts:No    Past Medical History:  Past Medical History:  Diagnosis Date  . Alcohol dependence in remission ([name])   . Anxiety   . Depression   . 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 GERD (gastroesophageal reflux disease)   . Seasonal allergies   . Sleep apnea   . Thrombocytopenia ([name])     Past Surgical History:  Procedure Laterality Date  . WISDOM TOOTH EXTRACTION       Family Psychiatric History:  Family History  Problem Relation Age of Onset  . Cancer Father        Prostate, colon  . Depression Father   . Cancer Maternal Aunt        Breast  . Depression Maternal Aunt   . Depression Mother   . Depression Paternal Aunt   . Depression Maternal Uncle   . Depression Paternal Uncle   . Bipolar disorder Maternal Grandmother   . Alcohol abuse Maternal Grandmother   . Bipolar disorder Cousin     Social History:  Social History   Socioeconomic History  . Marital status: Married    Spouse name: Not on file  . Number of children: Not on file  . Years of education: Not on file  . Highest education level: Not on file  Occupational History  . Not on file  Social Needs  . Financial resource strain: Not on file  . Food insecurity:    Worry: Not on file    Inability: Not on file  . Transportation needs:    Medical: Not on file    Non-medical: Not on file  Tobacco Use  . Smoking status: Current Some Day Smoker    Packs/day: 0.25    Years: 15.00    Pack years: 3.75    Types: Cigarettes    Last attempt to quit: 06/05/2017    Years since quitting: 1.0  . Smokeless tobacco: Never Used  . Tobacco comment: Patient smoking 4 ciggs a week  Substance and Sexual  Activity  . Alcohol use: No    Alcohol/week: 0.0 standard drinks    Comment: quit 11/2009  . Drug use: Yes    Types: Cocaine, Hydromorphone, Hydrocodone, Oxycodone, Benzodiazepines, Marijuana    Comment: quit all in 2009  . Sexual activity: Yes    Partners: Male  Lifestyle  . Physical activity:    Days per week: Not on file    Minutes per session: Not on file  . Stress: Not on file  Relationships  . Social connections:    Talks on phone: Not on file    Gets together: Not on file    Attends religious service: Not on file    Active member of club or organization: Not on file    Attends meetings of clubs or organizations: Not on file    Relationship status: Not on file  Other  Topics Concern  . Not on file  Social History Narrative  . Not on file    Allergies: No Known Allergies  Metabolic Disorder Labs: No results found for: HGBA1C, MPG No results found for: PROLACTIN No results found for: CHOL, TRIG, HDL, CHOLHDL, VLDL, LDLCALC No results found for: TSH  Therapeutic Level Labs: Lab Results  Component Value Date   LITHIUM 0.40 (L) 03/09/2015   No results found for: VALPROATE No components found for:  CBMZ  Current Medications: Current Outpatient Medications  Medication Sig Dispense Refill  . citalopram (CELEXA) 20 MG tablet Take 1 tablet (20 mg total) by mouth daily. 90 tablet 0  . gabapentin (NEURONTIN) 300 MG capsule TAKE 1 CAPSULE 4 TIMES     DAILY 360 capsule 0  . ibuprofen (ADVIL,MOTRIN) 600 MG tablet Take 1 tablet (600 mg total) by mouth every 6 (six) hours as needed for pain. 30 tablet 1  . lamoTRIgine (LAMICTAL) 100 MG tablet Take one tablet by mouth daily 90 tablet 0  . norgestimate-ethinyl estradiol (ORTHO-CYCLEN,SPRINTEC,PREVIFEM) 0.25-35 MG-MCG tablet Take 1 tablet by mouth daily. Reported on 12/14/2015    . rosuvastatin (CRESTOR) 20 MG tablet Take 20 mg by mouth daily.     No current facility-administered medications for this visit.      Musculoskeletal: Strength & Muscle Tone: within normal limits Gait & Station: normal Patient leans: N/A  Psychiatric Specialty Exam: Review of Systems  Constitutional: Negative for chills, diaphoresis and fever.  Psychiatric/Behavioral: Negative for depression, hallucinations, substance abuse and suicidal ideas. The patient is not nervous/anxious and does not have insomnia.     Blood pressure 116/78, pulse 93, height 5\' 5"  (1.651 m), weight 269 lb (122 kg), SpO2 97 %, unknown if currently breastfeeding.Body mass index is 44.76 kg/m.  General Appearance: Well Groomed  Eye Contact:  Good  Speech:  Clear and Coherent and Normal Rate  Volume:  Normal  Mood:  Anxious  Affect:  Congruent   Thought Process:  Goal Directed and Descriptions of Associations: Intact  Orientation:  Full (Time, Place, and Person)  Thought Content:  Logical  Suicidal Thoughts:  No  Homicidal Thoughts:  No  Memory:  Immediate;   Good Recent;   Good Remote;   Good  Judgement:  Good  Insight:  Good  Psychomotor Activity:  Normal  Concentration:  Concentration: Good and Attention Span: Good  Recall:  Good  Fund of Knowledge:  Good  Language:  Good  Akathisia:  No  Handed:  Right  AIMS (if indicated):     Assets:  Communication Skills Desire for Improvement Engineer, civil (consulting)  ADL's:  Intact  Cognition:  WNL  Sleep:   fair      Screenings:  I reviewed the information below on 06/27/2018 and have updated it Assessment and Plan: PMDD; GAD; alcohol dependence in remission; polysubstance abuse/opiates, marijuana, benzos, cocaine in remission; rule out bipolar 2 disorder; history of ADHD    Medication management with supportive therapy. Risks and benefits, side effects and alternative treatment options discussed with patient. Pt was given an opportunity to ask questions about medication, illness, and treatment. All current psychiatric medications have been reviewed and discussed with the patient and adjusted as clinically appropriate. The patient has been provided an accurate and updated list of the medications being now prescribed. Patient expressed understanding of how their medications were to be used.  Pt verbalized understanding and verbal consent obtained for treatment.  The risk of un-intended pregnancy is low based on the fact that pt reports she is taking OCP. Pt is aware that these meds carry a teratogenic risk. Pt will discuss plan of action if she does or plans to become pregnant in the future.  Status of current problems: stable  Meds: Celexa 20 mg p.o. daily for PMDD and GAD Lamictal 100 mg p.o. daily for  PMDD and potential bipolar disorder Neurontin 300 mg p.o. 4 times daily for off label treatment of GAD Due to history of polysubstance abuse will avoid all scheduled medications when possible  Labs: none  Therapy: brief supportive therapy provided. Discussed psychosocial stressors in detail.    Consultations: Encouraged to follow up with PCP as needed Encouraged pt to continue AA meetings and validated her continued sobriety Referred for therapy  Pt denies SI and is at an acute low risk for suicide. Patient told to call clinic if any problems occur. Patient advised to go to ER if they should develop SI/HI, side effects, or if symptoms worsen. Has crisis numbers to call if needed. Pt verbalized understanding.  F/up in 3 months or sooner if needed  The duration of this appointment visit was 20 minutes of face-to-face time with the patient.  Greater than 50% of this time was spent in counseling, explanation of  diagnosis, planning of further management, and coordination of care    [name], MD 06/27/2018, 11:56 AM

## 2018-07-10 ENCOUNTER — Encounter (HOSPITAL_COMMUNITY): Payer: Self-pay | Admitting: Licensed Clinical Social Worker

## 2018-07-10 ENCOUNTER — Ambulatory Visit (INDEPENDENT_AMBULATORY_CARE_PROVIDER_SITE_OTHER): Payer: 59 | Admitting: Licensed Clinical Social Worker

## 2018-07-10 DIAGNOSIS — F3181 Bipolar II disorder: Secondary | ICD-10-CM | POA: Diagnosis not present

## 2018-07-10 DIAGNOSIS — F411 Generalized anxiety disorder: Secondary | ICD-10-CM
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
## 2018-08-08 ENCOUNTER — Encounter (HOSPITAL_COMMUNITY): Payer: Self-pay | Admitting: Licensed Clinical Social Worker

## 2018-08-08 ENCOUNTER — Ambulatory Visit (INDEPENDENT_AMBULATORY_CARE_PROVIDER_SITE_OTHER): Payer: 59 | Admitting: Licensed Clinical Social Worker

## 2018-08-08 DIAGNOSIS — F3181 Bipolar II disorder: Secondary | ICD-10-CM | POA: Diagnosis not present

## 2018-08-08 DIAGNOSIS — F411 Generalized anxiety disorder: Secondary | ICD-10-CM

## 2018-08-08 NOTE — Progress Notes (Signed)
   THERAPIST PROGRESS NOTE  Session Time: 9:05am-10:00am  Participation Level: Active  Behavioral Response: Well GroomedAlertEuthymic and Irritable  Type of Therapy: Individual Therapy  Treatment Goals addressed: ""eat healthy, get control over emotional binge eating, quit smoking, and learn how to let my dad in".   Interventions: Motivational Interviewing, Cognitive Behavioral Therapy, Grounding and Mindfulness Techniques  Summary: Yaremi Stahlman is a 37 y.o. female who presents with Generalized Anxiety Disorder and Bipolar II.    Suicidal/Homicidal: No - without intent/plan  Therapist Response:  Angella met with clinician for an individual session. Corretta discussed her psychiatric symptoms, her current life events and her homework. Averi shared that since she got braces the other week, she has reduced her smoking and is not eating a lot of the things she was eating before. However, she continues to have difficulty coping with her relationship with father. Clinician provided psychoeducation about the impact of trauma on developmental trajectory, noting impact of major trauma at early ages. Kriti processed different events in her life and how they have impacted her current status. Clinician provided psychoeducation about emotional regulation and the role of anger as a secondary emotion. Karielle identified anger as her most primary impulse when stressed. 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    Plan: Return again in 2 weeks.  Diagnosis:     Axis I: Generalized Anxiety Disorder and Bipolar II.   [name], LCSW 08/08/2018

## 2018-08-22 ENCOUNTER — Encounter (HOSPITAL_COMMUNITY): Payer: Self-pay | Admitting: Licensed Clinical Social Worker

## 2018-08-22 ENCOUNTER — Ambulatory Visit (INDEPENDENT_AMBULATORY_CARE_PROVIDER_SITE_OTHER): Payer: 59 | Admitting: Licensed Clinical Social Worker

## 2018-08-22 DIAGNOSIS — F411 Generalized anxiety disorder: Secondary | ICD-10-CM | POA: Diagnosis not present

## 2018-08-22 DIAGNOSIS — F3181 Bipolar II disorder: Secondary | ICD-10-CM | POA: Diagnosis not present

## 2018-08-22 NOTE — Progress Notes (Signed)
   THERAPIST PROGRESS NOTE  Session Time: 8:05am-9:00am  Participation Level: Active  Behavioral Response: Well GroomedAlertEuthymic  Type of Therapy: Individual Therapy  Treatment Goals addressed: ""eat healthy, get control over emotional binge eating, quit smoking, and learn how to let my dad in".   Interventions: Motivational Interviewing, Cognitive Behavioral Therapy, Grounding and Mindfulness Techniques  Summary: Kye Silverstein is a 37 y.o. female who presents with Generalized Anxiety Disorder and Bipolar II.    Suicidal/Homicidal: No - without intent/plan  Therapist Response:  May met with clinician for an individual session. Ngina discussed her psychiatric symptoms, her current life events and her homework. Izetta shared that she has recently been in contact with an ex-boyfriend. She reported concerns about engaging in fantasy, as a way of avoiding issues in her marriage. Clinician utilized Motivational Interviewing and 12 Step facilitation to address these concerns and to challenge thoughts. Modestine processed issues in her own marriage and was able to identify some areas of growth and improvement. Noely also processed history of her relationship and attachment to potential past behaviors. Clinician noted additional addiction to sex, which had been tempted by the contact with her ex. Stanley responded well to support and MI counseling. She agreed that if necessary, marriage counseling is an option.    Plan: Return again in 2 weeks.  Diagnosis:     Axis I: Generalized Anxiety Disorder and Bipolar II.   [name], LCSW 08/22/2018

## 2018-09-05 ENCOUNTER — Ambulatory Visit (INDEPENDENT_AMBULATORY_CARE_PROVIDER_SITE_OTHER): Payer: 59 | Admitting: Licensed Clinical Social Worker

## 2018-09-05 ENCOUNTER — Encounter (HOSPITAL_COMMUNITY): Payer: Self-pay | Admitting: Licensed Clinical Social Worker

## 2018-09-05 DIAGNOSIS — F3181 Bipolar II disorder: Secondary | ICD-10-CM

## 2018-09-05 DIAGNOSIS — F411 Generalized anxiety disorder: Secondary | ICD-10-CM | POA: Diagnosis not present
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
## 2018-09-11 ENCOUNTER — Other Ambulatory Visit (HOSPITAL_COMMUNITY): Payer: Self-pay

## 2018-09-11 DIAGNOSIS — F3281 Premenstrual dysphoric disorder: Secondary | ICD-10-CM

## 2018-09-11 DIAGNOSIS — F411 Generalized anxiety disorder: Secondary | ICD-10-CM

## 2018-09-11 MED ORDER — CITALOPRAM HYDROBROMIDE 20 MG PO TABS: 20.0000 mg | ORAL_TABLET | Freq: Every day | ORAL | 0 refills | Status: DC

## 2018-09-11 MED ORDER — GABAPENTIN 300 MG PO CAPS: ORAL_CAPSULE | ORAL | 0 refills | Status: DC

## 2018-09-11 MED ORDER — LAMOTRIGINE 100 MG PO TABS: ORAL_TABLET | ORAL | 0 refills | Status: DC

## 2018-09-12 ENCOUNTER — Ambulatory Visit (HOSPITAL_COMMUNITY): Payer: Self-pay | Admitting: Psychiatry

## 2018-09-18 ENCOUNTER — Ambulatory Visit (HOSPITAL_COMMUNITY): Payer: Self-pay | Admitting: Licensed Clinical Social Worker

## 2018-10-02 ENCOUNTER — Ambulatory Visit (INDEPENDENT_AMBULATORY_CARE_PROVIDER_SITE_OTHER): Payer: 59 | Admitting: Licensed Clinical Social Worker

## 2018-10-02 ENCOUNTER — Encounter (HOSPITAL_COMMUNITY): Payer: Self-pay | Admitting: Licensed Clinical Social Worker

## 2018-10-02 DIAGNOSIS — F411 Generalized anxiety disorder: Secondary | ICD-10-CM | POA: Diagnosis not present

## 2018-10-02 DIAGNOSIS — F3181 Bipolar II disorder: Secondary | ICD-10-CM

## 2018-10-02 NOTE — Progress Notes (Signed)
   THERAPIST PROGRESS NOTE  Session Time: 8:05am-9:00am  Participation Level: Active  Behavioral Response: Well GroomedAlertEuthymic  Type of Therapy: Individual Therapy  Treatment Goals addressed: ""eat healthy, get control over emotional binge eating, quit smoking, and learn how to let my dad in".   Interventions: Motivational Interviewing, Cognitive Behavioral Therapy, Grounding and Mindfulness Techniques  Summary: Patricia Rivas is a 38 y.o. female who presents with Generalized Anxiety Disorder and Bipolar II.    Suicidal/Homicidal: No - without intent/plan  Therapist Response:  Patricia Rivas met with clinician for an individual session. Patricia Rivas discussed her psychiatric symptoms, her current life events and her homework. Patricia Rivas shared that she had a revelation about her ex-boyfriend, with whom she'd made contact over the past month. She reported that she realized that at this time, she is the healthy one in the relationship and he has not changed in 10 years. Patricia Rivas reported this validated her growth and gave her an easier transition out of contact. Clinician explored thoughts and feelings, noting that she has been working hard in her own recovery. She was concerned that her behavior was a result of a manic episode. Clinician explored this and noted possibility of similar behaviors and tendency to create additional "excitement" or "drama". Patricia Rivas opened up about her relationship with father. She reported she was going to [employer] this weekend with him and his girlfriend. She also reported that they were moving to [place] in order to be closer. Patricia Rivas identified difficulty in this relationship. Clinician explored her expectations of her father and what she wants from the relationship. Clinician also discussed the importance of having good boundaries.    Plan: Return again in 2 weeks.  Diagnosis:     Axis I: Generalized Anxiety Disorder and Bipolar II.  [name],  LCSW 10/02/2018

## 2018-10-05 ENCOUNTER — Other Ambulatory Visit (HOSPITAL_COMMUNITY): Payer: Self-pay | Admitting: Psychiatry

## 2018-10-05 DIAGNOSIS — F411 Generalized anxiety disorder: Secondary | ICD-10-CM

## 2018-10-05 DIAGNOSIS — F3281 Premenstrual dysphoric disorder: Secondary | ICD-10-CM

## 2018-10-07 ENCOUNTER — Ambulatory Visit (HOSPITAL_COMMUNITY): Payer: 59 | Admitting: Licensed Clinical Social Worker

## 2018-10-10 ENCOUNTER — Ambulatory Visit (INDEPENDENT_AMBULATORY_CARE_PROVIDER_SITE_OTHER): Payer: 59 | Admitting: Psychiatry

## 2018-10-10 ENCOUNTER — Encounter (HOSPITAL_COMMUNITY): Payer: Self-pay | Admitting: Psychiatry

## 2018-10-10 DIAGNOSIS — F3281 Premenstrual dysphoric disorder: Secondary | ICD-10-CM | POA: Diagnosis not present

## 2018-10-10 DIAGNOSIS — F411 Generalized anxiety disorder: Secondary | ICD-10-CM

## 2018-10-10 MED ORDER — CITALOPRAM HYDROBROMIDE 20 MG PO TABS: 20.0000 mg | ORAL_TABLET | Freq: Every day | ORAL | 0 refills | Status: DC

## 2018-10-10 MED ORDER — LAMOTRIGINE 100 MG PO TABS: ORAL_TABLET | ORAL | 0 refills | Status: DC

## 2018-10-10 MED ORDER — GABAPENTIN 300 MG PO CAPS: ORAL_CAPSULE | ORAL | 0 refills | Status: DC

## 2018-10-10 NOTE — Progress Notes (Signed)
BH MD/PA/NP OP Progress Note  10/10/2018 9:13 AM Patricia Rivas L Coletti  MRN:  [id number]  Chief Complaint:  Chief Complaint    Follow-up     HPI: Patricia PuntMiranda tells me that she is doing well.  At her last visit she was experiencing a number of stressors which were overwhelming.  Since then things have really calmed down and she is feeling better.  She has a new sponsor and really likes her.  She has been meeting with her therapist every other week and plans to increase that to weekly after the new year.  She is denying any anxiety/panic attack-like symptoms.  She denies depression and anhedonia.  She denies issues with insomnia.  She is denying any manic or hypomanic-like symptoms.  She does note that she is mildly irritable at the time of her period but no longer feels euphoric.  Patient is glad to have realized that she has gained some emotional maturity over the last 10 years and is better able to handle some stressors.  Visit Diagnosis:    ICD-10-CM   1. GAD (generalized anxiety disorder) F41.1 citalopram (CELEXA) 20 MG tablet    gabapentin (NEURONTIN) 300 MG capsule  2. PMDD (premenstrual dysphoric disorder) F32.81 citalopram (CELEXA) 20 MG tablet    lamoTRIgine (LAMICTAL) 100 MG tablet      Past Psychiatric History:  Anxiety:Yes Bipolar Disorder:No Depression:Yes Mania:No Psychosis:No Schizophrenia:No Personality Disorder:No Hospitalization for psychiatric illness:No History of Electroconvulsive Shock Therapy:No Prior Suicide Attempts:No    Past Medical History:  Past Medical History:  Diagnosis Date  . Alcohol dependence in remission ([name])   . Anxiety   . Depression   . 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 GERD (gastroesophageal reflux disease)   . Seasonal allergies   . Sleep apnea   . Thrombocytopenia ([name])     Past Surgical History:  Procedure Laterality Date  . WISDOM TOOTH EXTRACTION      Family Psychiatric History:  Family History   Problem Relation Age of Onset  . Cancer Father        Prostate, colon  . Depression Father   . Cancer Maternal Aunt        Breast  . Depression Maternal Aunt   . Depression Mother   . Depression Paternal Aunt   . Depression Maternal Uncle   . Depression Paternal Uncle   . Bipolar disorder Maternal Grandmother   . Alcohol abuse Maternal Grandmother   . Bipolar disorder Cousin     Social History:  Social History   Socioeconomic History  . Marital status: Married    Spouse name: Not on file  . Number of children: Not on file  . Years of education: Not on file  . Highest education level: Not on file  Occupational History  . Not on file  Social Needs  . Financial resource strain: Not on file  . Food insecurity:    Worry: Not on file    Inability: Not on file  . Transportation needs:    Medical: Not on file    Non-medical: Not on file  Tobacco Use  . Smoking status: Current Some Day Smoker    Packs/day: 0.25    Years: 15.00    Pack years: 3.75    Types: Cigarettes    Last attempt to quit: 06/05/2017    Years since quitting: 1.3  . Smokeless tobacco: Never Used  . Tobacco comment: Patient smoking 4 ciggs a week  Substance and Sexual Activity  .  Alcohol use: No    Alcohol/week: 0.0 standard drinks    Comment: quit 11/2009  . Drug use: Yes    Types: Cocaine, Hydromorphone, Hydrocodone, Oxycodone, Benzodiazepines, Marijuana    Comment: quit all in 2009  . Sexual activity: Yes    Partners: Male  Lifestyle  . Physical activity:    Days per week: Not on file    Minutes per session: Not on file  . Stress: Not on file  Relationships  . Social connections:    Talks on phone: Not on file    Gets together: Not on file    Attends religious service: Not on file    Active member of club or organization: Not on file    Attends meetings of clubs or organizations: Not on file    Relationship status: Not on file  Other Topics Concern  . Not on file  Social History  Narrative  . Not on file    Allergies: No Known Allergies  Metabolic Disorder Labs: No results found for: HGBA1C, MPG No results found for: PROLACTIN No results found for: CHOL, TRIG, HDL, CHOLHDL, VLDL, LDLCALC No results found for: TSH  Therapeutic Level Labs: Lab Results  Component Value Date   LITHIUM 0.40 (L) 03/09/2015   No results found for: VALPROATE No components found for:  CBMZ  Current Medications: Current Outpatient Medications  Medication Sig Dispense Refill  . citalopram (CELEXA) 20 MG tablet Take 1 tablet (20 mg total) by mouth daily. 90 tablet 0  . gabapentin (NEURONTIN) 300 MG capsule TAKE 1 CAPSULE 4 TIMES     DAILY 360 capsule 0  . ibuprofen (ADVIL,MOTRIN) 600 MG tablet Take 1 tablet (600 mg total) by mouth every 6 (six) hours as needed for pain. 30 tablet 1  . lamoTRIgine (LAMICTAL) 100 MG tablet Take one tablet by mouth daily 90 tablet 0  . norgestimate-ethinyl estradiol (ORTHO-CYCLEN,SPRINTEC,PREVIFEM) 0.25-35 MG-MCG tablet Take 1 tablet by mouth daily. Reported on 12/14/2015    . rosuvastatin (CRESTOR) 20 MG tablet Take 20 mg by mouth daily.     No current facility-administered medications for this visit.      Musculoskeletal: Strength & Muscle Tone: within normal limits Gait & Station: normal Patient leans: N/A  Psychiatric Specialty Exam: Review of Systems  Constitutional: Negative for chills, diaphoresis and fever.  Respiratory: Negative for cough, shortness of breath and wheezing.     Blood pressure 126/74, height 5\' 6"  (1.676 m), weight 266 lb (120.7 kg), unknown if currently breastfeeding.Body mass index is 42.93 kg/m.  General Appearance: Fairly Groomed  Eye Contact:  Good  Speech:  Clear and Coherent and Normal Rate  Volume:  Normal  Mood:  Euthymic  Affect:  Full Range  Thought Process:  Goal Directed and Descriptions of Associations: Intact  Orientation:  Full (Time, Place, and Person)  Thought Content:  Logical  Suicidal  Thoughts:  No  Homicidal Thoughts:  No  Memory:  Immediate;   Good  Judgement:  Good  Insight:  Good  Psychomotor Activity:  Normal  Concentration:  Concentration: Good  Recall:  Good  Fund of Knowledge:  Good  Language:  Good  Akathisia:  No  Handed:  Right  AIMS (if indicated):     Assets:  Communication Skills Desire for Improvement Financial Resources/Insurance Housing Intimacy Leisure Time Resilience Social Support Talents/Skills Transportation Vocational/Educational  ADL's:  Intact  Cognition:  WNL  Sleep:   good   Screenings:    I reviewed the information below on  10/10/2018 and have updated it Assessment and Plan: PMDD; GAD; alcohol dependence in remission; polysubstance abuse/opiates, marijuana, benzos, cocaine in remission; rule out bipolar 2 disorder; history of ADHD    Medication management with supportive therapy. Risks and benefits, side effects and alternative treatment options discussed with patient. Pt was given an opportunity to ask questions about medication, illness, and treatment. All current psychiatric medications have been reviewed and discussed with the patient and adjusted as clinically appropriate. The patient has been provided an accurate and updated list of the medications being now prescribed. Patient expressed understanding of how their medications were to be used.  Pt verbalized understanding and verbal consent obtained for treatment.  The risk of un-intended pregnancy is low based on the fact that pt reports she is taking OCP. Pt is aware that these meds carry a teratogenic risk. Pt will discuss plan of action if she does or plans to become pregnant in the future.  Status of current problems: stable  Meds: Celexa 20 mg p.o. daily for PMDD and GAD Lamictal 100 mg p.o. daily for PMDD and potential bipolar disorder Neurontin 300 mg p.o. 4 times daily for off label treatment of GAD Due to history of polysubstance abuse will avoid all scheduled  medications when possible  Labs: none  Therapy: brief supportive therapy provided. Discussed psychosocial stressors in detail.    Consultations: Encouraged to follow up with PCP as needed Encouraged pt to continue AA meetings and validated her continued sobriety [facility]  Pt denies SI and is at an acute low risk for suicide. Patient told to call clinic if any problems occur. Patient advised to go to ER if they should develop SI/HI, side effects, or if symptoms worsen. Has crisis numbers to call if needed. Pt verbalized understanding.  F/up in 3 months or sooner if needed  The duration of this appointment visit was 20 minutes of face-to-face time with the patient.  Greater than 50% of this time was spent in counseling, explanation of  diagnosis, planning of further management, and coordination of care    Oletta DarterSalina Seraphim Trow, MD 10/10/2018, 9:13 AM

## 2018-11-05 ENCOUNTER — Ambulatory Visit (INDEPENDENT_AMBULATORY_CARE_PROVIDER_SITE_OTHER): Payer: 59 | Admitting: Licensed Clinical Social Worker

## 2018-11-05 ENCOUNTER — Encounter (HOSPITAL_COMMUNITY): Payer: Self-pay | Admitting: Licensed Clinical Social Worker

## 2018-11-05 DIAGNOSIS — F411 Generalized anxiety disorder: Secondary | ICD-10-CM

## 2018-11-05 DIAGNOSIS — F3181 Bipolar II disorder: Secondary | ICD-10-CM

## 2018-11-05 NOTE — Progress Notes (Signed)
   THERAPIST PROGRESS NOTE  Session Time: 8:05am-9:00am  Participation Level: Active  Behavioral Response: Well GroomedAlertEuthymic  Type of Therapy: Individual Therapy  Treatment Goals addressed: ""eat healthy, get control over emotional binge eating, quit smoking, and learn how to let my dad in".   Interventions: Motivational Interviewing, Cognitive Behavioral Therapy, Grounding and Mindfulness Techniques  Summary: Glynis Hunsucker is a 38 y.o. female who presents with Generalized Anxiety Disorder and Bipolar II.    Suicidal/Homicidal: No - without intent/plan  Therapist Response:  Shelaine met with clinician for an individual session. Marshia discussed her psychiatric symptoms, her current life events and her homework. Marzelle shared that she continues to come to terms with her relationship with her father. Clinician explored interactions over the holidays and noted that there is some effort on dad's part to engage and be a part of her family. Climmie processed experiences of growing up with dad and step mom, which involved dad not sticking up for her, not defending her, and "abandoning" her. Clinician utilized MI OARS to reflect and summarize thoughts and feelings. Clinician also encouraged Brieonna to look at things from dad's perspective and take into consideration his feelings and decisions. Clinician also noted that she sees past events from the developmental level she was in at the time, so an event at 26 would be remembered from the perspective of an 38 year old, even though she is 20 now.  Clinician also processed the importance of journaling at this time in order to process between sessions and get these hurt feelings out productively.    Plan: Return again in 1-2 weeks.  Diagnosis:     Axis I: Generalized Anxiety Disorder and Bipolar II.   [name], LCSW 11/05/2018

## 2018-11-12 ENCOUNTER — Ambulatory Visit (HOSPITAL_COMMUNITY): Payer: 59 | Admitting: Licensed Clinical Social Worker

## 2018-11-19 ENCOUNTER — Encounter (HOSPITAL_COMMUNITY): Payer: Self-pay | Admitting: Licensed Clinical Social Worker

## 2018-11-19 ENCOUNTER — Ambulatory Visit (INDEPENDENT_AMBULATORY_CARE_PROVIDER_SITE_OTHER): Payer: 59 | Admitting: Licensed Clinical Social Worker

## 2018-11-19 DIAGNOSIS — F3181 Bipolar II disorder: Secondary | ICD-10-CM | POA: Diagnosis not present

## 2018-11-19 DIAGNOSIS — F411 Generalized anxiety disorder: Secondary | ICD-10-CM | POA: Diagnosis not present

## 2018-11-19 NOTE — Progress Notes (Signed)
   THERAPIST PROGRESS NOTE  Session Time: 8:00am-9:00am  Participation Level: Active  Behavioral Response: Well GroomedAlertEuthymic  Type of Therapy: Individual Therapy  Treatment Goals addressed: ""eat healthy, get control over emotional binge eating, quit smoking, and learn how to let my dad in".   Interventions: Motivational Interviewing, Cognitive Behavioral Therapy, Grounding and Mindfulness Techniques  Summary: Patricia Rivas is a 38 y.o. female who presents with Generalized Anxiety Disorder and Bipolar II.    Suicidal/Homicidal: No - without intent/plan  Therapist Response:  Patricia Rivas met with clinician for an individual session. Patricia Rivas discussed her psychiatric symptoms, her current life events and her homework. Patricia Rivas shared that she has had some difficulty controlling her anger and frustration at home with her husband. Clinician utilized MI OARS to reflect and summarize, as well as normalize her frustration. Clinician discussed ways to manage her own anxiety when husband brings up politics. Clinician utilized CBT to assist her in understanding the connections between her thoughts, feelings, and behaviors. Clinician also encouraged Patricia Rivas to be a little more open minded and thoughtful about husband's upbringing and experience.    Plan: Return again in 2 weeks.  Diagnosis:     Axis I: Generalized Anxiety Disorder and Bipolar II.  [name], LCSW 11/19/2018

## 2018-11-27 ENCOUNTER — Encounter: Payer: Self-pay | Admitting: Pulmonary Disease

## 2018-11-27 ENCOUNTER — Ambulatory Visit (INDEPENDENT_AMBULATORY_CARE_PROVIDER_SITE_OTHER): Payer: Managed Care, Other (non HMO) | Admitting: Pulmonary Disease

## 2018-11-27 VITALS — BP 124/78 | HR 72 | Ht 66.0 in | Wt 266.0 lb

## 2018-11-27 DIAGNOSIS — G4730 Sleep apnea, unspecified: Secondary | ICD-10-CM

## 2018-11-27 DIAGNOSIS — G4719 Other hypersomnia: Secondary | ICD-10-CM

## 2018-11-27 DIAGNOSIS — E669 Obesity, unspecified: Secondary | ICD-10-CM

## 2018-11-27 DIAGNOSIS — R0683 Snoring: Secondary | ICD-10-CM | POA: Diagnosis not present

## 2018-11-27 DIAGNOSIS — R6889 Other general symptoms and signs: Secondary | ICD-10-CM

## 2018-11-27 DIAGNOSIS — G4733 Obstructive sleep apnea (adult) (pediatric): Secondary | ICD-10-CM | POA: Diagnosis not present

## 2018-11-27 NOTE — Patient Instructions (Signed)
Will arrange for home sleep study  Will have [employer] refit your CPAP mask and change pressure setting on your CPAP machine  Will call to arrange for follow up after sleep study reviewed

## 2018-11-27 NOTE — Progress Notes (Signed)
[facility]  Chief Complaint  Patient presents with  . Consult    snoring on CPAP machine    Constitutional:  BP 124/78 (BP Location: Left Arm, Cuff Size: Normal)   Pulse 72   Ht 5\' 6"  (1.676 m)   Wt 266 lb (120.7 kg)   SpO2 96%   BMI 42.93 kg/m   Past Medical History:  Anxiety, Depression, GERD, Allergies, ITP, Allergies, HPV, ETOH, HLD  Brief Summary:  Patricia Rivas is a 38 y.o. female former smoker with obstructive sleep apnea.  I last saw her in 2016.  She weighed 240 lbs in 2016.  She had home sleep study that showed mild sleep apnea.  She did see an orthodontist to try an oral appliance.  This seemed to work, but was too expensive.  She thinks this was run through dental and not medical insurance.    She has been using CPAP since 2016.  Has full face mask.  She gets supplies from [place] and these have been updated regularly.  She has been snoring more while wearing CPAP.  This has been getting progressively worse over the past 1 year.  She has also noticed feeling more sleepy during the day.  She had braces put on in 2019.  She is also working with an Transport planner in [place] and will likely need mandibular advancement surgery sometime in Fall of 2020.  She goes to sleep at 830 pm.  She falls asleep in 10 minutes.  She wakes up some times to use the bathroom.  She gets out of bed at 530 am.  She feels more tired in the morning.  She denies morning headache.  She does not use anything to help her fall sleep or stay awake.  She denies sleep walking, sleep talking, bruxism, or nightmares.  There is no history of restless legs.  She denies sleep hallucinations, sleep paralysis, or cataplexy.  The Epworth score is 5 out of 24.    Physical Exam:   Appearance - well kempt   ENMT - clear nasal mucosa, midline nasal  septum, no oral exudates, no LAN, trachea midline, overbite, low laying soft palate, MP 4, scalloped  tongue  Respiratory - normal chest wall, normal respiratory effort, no accessory muscle use, no wheeze/rales  CV - s1s2 regular rate and rhythm, no murmurs, no peripheral edema, radial pulses symmetric  GI - soft, non tender, no masses  Lymph - no adenopathy noted in neck and axillary areas  MSK - normal gait  Ext - no cyanosis, clubbing, or joint inflammation noted  Skin - no rashes, lesions, or ulcers  Neuro - normal strength, oriented x 3  Psych - normal mood and affect  Discussion:  She has history of sleep apnea.  She has gained weight since original set up.  With weight gain, she is snoring more and feeling more tired during the day in spite of good compliance with CPAP therapy.  She is also working with an Medical illustrator, and will likely need to have mandibular advancement surgery in Fall 2020.  Assessment/Plan:   Snoring with daytime sleepiness. - will arrange for home sleep study to assess current status of sleep apnea  Obstructive sleep apnea. - will have her DME refit her mask - will change auto CPAP to 5 - 20 cm H2O - she is compliant with CPAP therapy otherwise - explained how surgical intervention could be curative for her sleep apnea, especially in  association with weight reduction - if she ultimately still needs CPAP and continues to have difficulty with snoring and daytime sleepiness, then she might need in lab titration study  Obesity. - discussed importance of weight loss    Patient Instructions  Will arrange for home sleep study  Will have [employer] refit your CPAP mask and change pressure setting on your CPAP machine  Will call to arrange for follow up after sleep study reviewed     Coralyn HellingVineet Dayne Chait, MD [facility] Pager: [phone] 11/27/2018, 10:01 AM  Flow Sheet    Sleep tests:  HST 11/17/14 >> AHI 8.3, SaO2 low 80%. Auto CPAP 10/27/18 to 11/25/18 >> used on 30 of 30 nights with average 7 hrs 47 min.  Average  AHI 1.2 with median CPAP 9 and 95 th percentile CPAP 12 cm H2O.  Review of Systems  Constitutional: Negative.   HENT: Negative.   Eyes: Negative.   Respiratory: Positive for apnea.   Cardiovascular: Negative.   Gastrointestinal: Negative.   Endocrine: Negative.   Genitourinary: Negative.   Musculoskeletal: Positive for back pain.  Skin: Negative.   Neurological: Negative.   Hematological: Negative.   Psychiatric/Behavioral: Positive for sleep disturbance.   Medications:   Allergies as of 11/27/2018   No Known Allergies     Medication List       Accurate as of November 27, 2018 10:01 AM. Always use your most recent med list.        citalopram 20 MG tablet Commonly known as:  CELEXA Take 1 tablet (20 mg total) by mouth daily.   gabapentin 300 MG capsule Commonly known as:  NEURONTIN TAKE 1 CAPSULE 4 TIMES     DAILY   ibuprofen 600 MG tablet Commonly known as:  ADVIL,MOTRIN Take 1 tablet (600 mg total) by mouth every 6 (six) hours as needed for pain.   lamoTRIgine 100 MG tablet Commonly known as:  LAMICTAL Take one tablet by mouth daily   norgestimate-ethinyl estradiol 0.25-35 MG-MCG tablet Commonly known as:  ORTHO-CYCLEN,SPRINTEC,PREVIFEM Take 1 tablet by mouth daily. Reported on 12/14/2015   rosuvastatin 20 MG tablet Commonly known as:  CRESTOR Take 20 mg by mouth daily.       Past Surgical History:  She  has a past surgical history that includes Wisdom tooth extraction.  Family History:  Her family history includes Alcohol abuse in her maternal grandmother; Bipolar disorder in her cousin and maternal grandmother; Cancer in her father and maternal aunt; Depression in her father, maternal aunt, maternal uncle, mother, paternal aunt, and paternal uncle.  Social History:  She  reports that she quit smoking about 5 weeks ago. Her smoking use included cigarettes. She has a 3.75 pack-year smoking history. She has never used smokeless tobacco. She reports current  drug use. Drugs: Cocaine, Hydromorphone, Hydrocodone, Oxycodone, Benzodiazepines, and Marijuana. She reports that she does not drink alcohol.

## 2018-11-27 NOTE — Progress Notes (Signed)
   Subjective:    Patient ID: Patricia Rivas, female    DOB: April 29, 1981, 38 y.o.   MRN: [id number]  HPI    Review of Systems  Constitutional: Negative.   HENT: Negative.   Eyes: Negative.   Respiratory: Positive for apnea.   Cardiovascular: Negative.   Gastrointestinal: Negative.   Endocrine: Negative.   Genitourinary: Negative.   Musculoskeletal: Positive for back pain.  Skin: Negative.   Neurological: Negative.   Hematological: Negative.   Psychiatric/Behavioral: Positive for sleep disturbance.       Objective:   Physical Exam        Assessment & Plan:

## 2018-12-19 DIAGNOSIS — R6889 Other general symptoms and signs: Secondary | ICD-10-CM

## 2018-12-19 DIAGNOSIS — G4719 Other hypersomnia: Secondary | ICD-10-CM

## 2018-12-19 DIAGNOSIS — R0683 Snoring: Secondary | ICD-10-CM

## 2018-12-19 DIAGNOSIS — G4733 Obstructive sleep apnea (adult) (pediatric): Secondary | ICD-10-CM | POA: Diagnosis not present

## 2018-12-25 ENCOUNTER — Ambulatory Visit (HOSPITAL_COMMUNITY): Payer: 59 | Admitting: Licensed Clinical Social Worker

## 2018-12-25 ENCOUNTER — Telehealth: Payer: Self-pay | Admitting: Pulmonary Disease

## 2018-12-25 DIAGNOSIS — G4733 Obstructive sleep apnea (adult) (pediatric): Secondary | ICD-10-CM | POA: Diagnosis not present

## 2018-12-25 NOTE — Telephone Encounter (Signed)
HST 12/19/18 >> AHI 5.3, SpO2 low 91%   Please inform her that her sleep study shows mild obstructive sleep apnea.  Please arrange for ROV with me or NP to discuss treatment options.

## 2018-12-27 NOTE — Telephone Encounter (Signed)
Spoke with the pt and notified of results/recs per VS  Appt scheduled and pt aware of office location

## 2019-01-01 ENCOUNTER — Telehealth: Payer: Self-pay | Admitting: Pulmonary Disease

## 2019-01-01 NOTE — Telephone Encounter (Signed)
ATC Patient.  Left message for Patient to call back. Patient needs follow up mild sleep apnea appt.  Per VS 12/25/18 Note    HST 12/19/18 >> AHI 5.3, SpO2 low 91%   Please inform her that her sleep study shows mild obstructive sleep apnea.  Please arrange for ROV with me or NP to discuss treatment options.

## 2019-01-02 NOTE — Telephone Encounter (Signed)
Called and spoke with pt letting her know the results of the HST and stated to her that VS said for pt to schedule appt either with him or a NP to discuss treatment options. Pt expressed understanding. Scheduled appt for pt with Elisha Headland, NP Tues. 3/17 at 11am. Nothing further needed.

## 2019-01-03 ENCOUNTER — Ambulatory Visit: Payer: Self-pay | Admitting: Pulmonary Disease

## 2019-01-07 ENCOUNTER — Ambulatory Visit: Payer: Self-pay | Admitting: Pulmonary Disease

## 2019-01-07 ENCOUNTER — Telehealth (HOSPITAL_COMMUNITY): Payer: Self-pay

## 2019-01-07 NOTE — Telephone Encounter (Signed)
Medication refill - Patient had to cancel appt 01/09/19 and rescheduled for 02/13/19.  Patient is requesting refills of Lamictal, Gabapentin, and Celexa be sent into her pharmacy.

## 2019-01-08 ENCOUNTER — Ambulatory Visit (HOSPITAL_COMMUNITY): Payer: 59 | Admitting: Licensed Clinical Social Worker

## 2019-01-09 ENCOUNTER — Ambulatory Visit (HOSPITAL_COMMUNITY): Payer: 59 | Admitting: Psychiatry

## 2019-01-09 ENCOUNTER — Other Ambulatory Visit (HOSPITAL_COMMUNITY): Payer: Self-pay | Admitting: Psychiatry

## 2019-01-09 DIAGNOSIS — F3281 Premenstrual dysphoric disorder: Secondary | ICD-10-CM

## 2019-01-09 DIAGNOSIS — F411 Generalized anxiety disorder: Secondary | ICD-10-CM

## 2019-01-09 MED ORDER — GABAPENTIN 300 MG PO CAPS: ORAL_CAPSULE | ORAL | 0 refills | Status: DC

## 2019-01-09 MED ORDER — LAMOTRIGINE 100 MG PO TABS: ORAL_TABLET | ORAL | 0 refills | Status: DC

## 2019-01-09 MED ORDER — CITALOPRAM HYDROBROMIDE 20 MG PO TABS: 20.0000 mg | ORAL_TABLET | Freq: Every day | ORAL | 0 refills | Status: DC

## 2019-01-09 NOTE — Telephone Encounter (Signed)
done

## 2019-01-09 NOTE — Telephone Encounter (Signed)
Medication management - Telephone message left for patient that Dr. Michae Kava had sent in all new refill orders that she requested to her [facility] on [address].  Requested she keep next appt and call back if any problems prior to then.

## 2019-01-09 NOTE — Progress Notes (Signed)
Refilled meds- pt cancelled appt today and rescheduled for April. Due to current coronavirus pandemic I will refill for 90 days

## 2019-01-22 ENCOUNTER — Ambulatory Visit (HOSPITAL_COMMUNITY): Payer: 59 | Admitting: Licensed Clinical Social Worker

## 2019-02-05 ENCOUNTER — Other Ambulatory Visit: Payer: Self-pay

## 2019-02-05 ENCOUNTER — Ambulatory Visit (INDEPENDENT_AMBULATORY_CARE_PROVIDER_SITE_OTHER): Payer: Self-pay | Admitting: Licensed Clinical Social Worker

## 2019-02-05 ENCOUNTER — Encounter (HOSPITAL_COMMUNITY): Payer: Self-pay | Admitting: Licensed Clinical Social Worker

## 2019-02-05 DIAGNOSIS — F411 Generalized anxiety disorder: Secondary | ICD-10-CM

## 2019-02-05 DIAGNOSIS — F3181 Bipolar II disorder: Secondary | ICD-10-CM

## 2019-02-05 NOTE — Progress Notes (Signed)
Virtual Visit via Video Note  I connected with Patricia Rivas on 02/05/19 at 11:00 AM EDT by a video enabled telemedicine application and verified that I am speaking with the correct person using two identifiers.   I discussed the limitations of evaluation and management by telemedicine and the availability of in person appointments. The patient expressed understanding and agreed to proceed.   Type of Therapy: Individual Therapy   Treatment Goals addressed: ""eat healthy, get control over emotional binge eating, quit smoking, and learn how to let my dad in".   Interventions: Motivational Interviewing, Cognitive Behavioral Therapy, Grounding and Mindfulness Techniques   Summary: Patricia Rivas is a 38 y.o. female who presents with Generalized Anxiety Disorder and Bipolar II.   Suicidal/Homicidal: No - without intent/plan   Therapist Response:  [name] met with clinician for an individual session. [name] discussed her psychiatric symptoms, her current life events and her homework. [name] shared that overall things are going okay. She reports she has been working from home for about 1 month. Her husband left his job about 3 weeks ago, which has been very positive and there is no concern right now for money due to Mize continuing to work. However, she reports stress over family interactions with her inlaws and her father. Clinician processed these thoughts and feelings. Clinician also discussed the complexities of emotions and identified that often different emotions can exist at the same time. Clinician normalized thoughts and feelings around relationship with dad and her past feelings of "being in the way". Clinician also identified the importance of addressing these feelings in order to be able to cope and engage in their relationship.  [name] reports she is doing 12 Steps on her relationship with dad. She also identified attended Zoom Waves and ACOA meetings.    Plan: Return again in 2  weeks.   Diagnosis: Axis I: Generalized Anxiety Disorder and Bipolar II.   I discussed the assessment and treatment plan with the patient. The patient was provided an opportunity to ask questions and all were answered. The patient agreed with the plan and demonstrated an understanding of the instructions.   The patient was advised to call back or seek an in-person evaluation if the symptoms worsen or if the condition fails to improve as anticipated.  I provided 60 minutes of non-face-to-face time during this encounter.   [name], LCSW

## 2019-02-13 ENCOUNTER — Encounter (HOSPITAL_COMMUNITY): Payer: Self-pay | Admitting: Psychiatry

## 2019-02-13 ENCOUNTER — Ambulatory Visit (INDEPENDENT_AMBULATORY_CARE_PROVIDER_SITE_OTHER): Payer: Self-pay | Admitting: Psychiatry

## 2019-02-13 ENCOUNTER — Other Ambulatory Visit: Payer: Self-pay

## 2019-02-13 DIAGNOSIS — F3281 Premenstrual dysphoric disorder: Secondary | ICD-10-CM

## 2019-02-13 DIAGNOSIS — F411 Generalized anxiety disorder: Secondary | ICD-10-CM

## 2019-02-13 MED ORDER — CITALOPRAM HYDROBROMIDE 10 MG PO TABS: 30.0000 mg | ORAL_TABLET | Freq: Every day | ORAL | 0 refills | Status: DC

## 2019-02-13 MED ORDER — LAMOTRIGINE 100 MG PO TABS: ORAL_TABLET | ORAL | 0 refills | Status: DC

## 2019-02-13 MED ORDER — GABAPENTIN 300 MG PO CAPS: ORAL_CAPSULE | ORAL | 0 refills | Status: DC

## 2019-02-13 NOTE — Progress Notes (Signed)
[facility] MD/PA/NP virtual visit via telephone note   02/13/2019 9:54 AM Patricia Rivas  MRN:  [id number]  I connected with Patricia Rivas on 02/13/19 at  9:30am EDT by telephone and verified that I am speaking with the correct person using two identifiers.    I discussed the limitations, risks, security and privacy concerns of performing an evaluation and management service by telephone and the availability of in person appointments. I also discussed with the patient that there may be a patient responsible charge related to this service. The patient expressed understanding and agreed to proceed.     Chief Complaint:  Chief Complaint    Depression     HPI: Patricia Rivas states that there is a lot going on but overall her family has adjusted well.  They get along and are very supportive of each other.  Patricia Rivas has been working from home since mid March.  Her days are busy with her work and her daughter schoolwork.  She is denying any anxiety.  She is denying any manic or hypomanic-like symptoms.  She is denying mood swings.  She is having some irritability but feels it is related to the current pandemic safety restrictions in place.  Her sleep has been good.  She is getting at least 8 hours of sleep.  She is using her CPAP machine and wakes up feeling refreshed.  She started taking melatonin 10 mg about a week ago as her sleep schedule had shifted back by a couple of hours and it is getting back on track.  She is concerned as of late that for the last 2 to 3 weeks she has had near daily episodes of depression.  She will experience 30 minutes to 1 hour, usually in the afternoon, where she will have a sudden drop in her energy level and feels down wants to isolate and withdraw from everyone.  During this time she will nap for about an hour and then wakes up and feels better.  Her husband has even commented on it.  It is not like her previous symptoms of depression but she does feel that it is depression and  that it is her way of "escaping".  She is denying any SI/HI.  She does think an increase in Celexa would be helpful.  Patricia Rivas has been attending daily AA meetings and is denying any alcohol or substance abuse.  She has been engaging in online therapy through [facility] behavioral health and finds that it is helpful.     Visit Diagnosis:    ICD-10-CM   1. PMDD (premenstrual dysphoric disorder) F32.81 citalopram (CELEXA) 10 MG tablet    lamoTRIgine (LAMICTAL) 100 MG tablet  2. GAD (generalized anxiety disorder) F41.1 gabapentin (NEURONTIN) 300 MG capsule    citalopram (CELEXA) 10 MG tablet      Past Psychiatric History:  Anxiety:Yes Bipolar Disorder:No Depression:Yes Mania:No Psychosis:No Schizophrenia:No Personality Disorder:No Hospitalization for psychiatric illness:No History of Electroconvulsive Shock Therapy:No Prior Suicide Attempts:No    Past Medical History:  Past Medical History:  Diagnosis Date  . Alcohol dependence in remission ([name])   . Anxiety   . Depression   . 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 GERD (gastroesophageal reflux disease)   . Seasonal allergies   . Sleep apnea   . Thrombocytopenia ([name])     Past Surgical History:  Procedure Laterality Date  . WISDOM TOOTH EXTRACTION      Family Psychiatric History:  Family History  Problem Relation Age of  Onset  . Cancer Father        Prostate, colon  . Depression Father   . Cancer Maternal Aunt        Breast  . Depression Maternal Aunt   . Depression Mother   . Depression Paternal Aunt   . Depression Maternal Uncle   . Depression Paternal Uncle   . Bipolar disorder Maternal Grandmother   . Alcohol abuse Maternal Grandmother   . Bipolar disorder Cousin     Social History:  Social History   Socioeconomic History  . Marital status: Married    Spouse name: Not on file  . Number of children: Not on file  . Years of education: Not on file  . Highest education level: Not on  file  Occupational History  . Not on file  Social Needs  . Financial resource strain: Not on file  . Food insecurity:    Worry: Not on file    Inability: Not on file  . Transportation needs:    Medical: Not on file    Non-medical: Not on file  Tobacco Use  . Smoking status: Former Smoker    Packs/day: 0.25    Years: 15.00    Pack years: 3.75    Types: Cigarettes    Last attempt to quit: 10/23/2018    Years since quitting: 0.3  . Smokeless tobacco: Never Used  . Tobacco comment: Patient smoking 4 ciggs a week  Substance and Sexual Activity  . Alcohol use: No    Alcohol/week: 0.0 standard drinks    Comment: quit 11/2009  . Drug use: Yes    Types: Cocaine, Hydromorphone, Hydrocodone, Oxycodone, Benzodiazepines, Marijuana    Comment: quit all in 2009  . Sexual activity: Yes    Partners: Male  Lifestyle  . Physical activity:    Days per week: Not on file    Minutes per session: Not on file  . Stress: Not on file  Relationships  . Social connections:    Talks on phone: Not on file    Gets together: Not on file    Attends religious service: Not on file    Active member of club or organization: Not on file    Attends meetings of clubs or organizations: Not on file    Relationship status: Not on file  Other Topics Concern  . Not on file  Social History Narrative  . Not on file    Allergies: No Known Allergies  Metabolic Disorder Labs: No results found for: HGBA1C, MPG No results found for: PROLACTIN No results found for: CHOL, TRIG, HDL, CHOLHDL, VLDL, LDLCALC No results found for: TSH  Therapeutic Level Labs: Lab Results  Component Value Date   LITHIUM 0.40 (L) 03/09/2015   No results found for: VALPROATE No components found for:  CBMZ  Current Medications: Current Outpatient Medications  Medication Sig Dispense Refill  . citalopram (CELEXA) 10 MG tablet Take 3 tablets (30 mg total) by mouth daily. 270 tablet 0  . gabapentin (NEURONTIN) 300 MG capsule TAKE 1  CAPSULE 4 TIMES     DAILY 360 capsule 0  . ibuprofen (ADVIL,MOTRIN) 600 MG tablet Take 1 tablet (600 mg total) by mouth every 6 (six) hours as needed for pain. 30 tablet 1  . lamoTRIgine (LAMICTAL) 100 MG tablet Take one tablet by mouth daily 90 tablet 0  . Melatonin 10 MG TABS Take 10 mg by mouth daily.    . norgestimate-ethinyl estradiol (ORTHO-CYCLEN,SPRINTEC,PREVIFEM) 0.25-35 MG-MCG tablet Take 1 tablet by  mouth daily. Reported on 12/14/2015    . rosuvastatin (CRESTOR) 20 MG tablet Take 20 mg by mouth daily.     No current facility-administered medications for this visit.      Musculoskeletal: Strength & Muscle Tone: within normal limits Gait & Station: normal Patient leans: N/A  Psychiatric Specialty Exam: Psychiatric Specialty Exam: ROS  unknown if currently breastfeeding.There is no height or weight on file to calculate BMI.  General Appearance: unable to comment  Eye Contact:  unable to comment  Speech:  Clear and Coherent and Normal Rate  Volume:  Normal  Mood:  Depressed  Affect:  Full Range  Thought Process:  Goal Directed and Descriptions of Associations: Intact  Orientation:  Full (Time, Place, and Person)  Thought Content:  Logical  Suicidal Thoughts:  No  Homicidal Thoughts:  No  Memory:  Immediate;   Good  Judgement:  Good  Insight:  Good  Psychomotor Activity:  unable to comment  Concentration:  Concentration: Good and Attention Span: Good  Recall:  Good  Fund of Knowledge:  Good  Language:  Good  Akathisia:  unable to comment  Handed:  Right  AIMS (if indicated):     Assets:  Communication Skills Desire for Improvement Housing Intimacy Resilience Social Support Talents/Skills Transportation Vocational/Educational  ADL's:  Unable to comment  Cognition:  WNL  Sleep:    good        Screenings:    I reviewed the information below 1 02/13/2019 and have updated it Assessment and Plan: PMDD; GAD; alcohol dependence in remission; polysubstance  abuse/opiates, marijuana, benzos, cocaine in remission; rule out bipolar 2 disorder; history of ADHD    Medication management with supportive therapy. Risks and benefits, side effects and alternative treatment options discussed with patient. Pt was given an opportunity to ask questions about medication, illness, and treatment. All current psychiatric medications have been reviewed and discussed with the patient and adjusted as clinically appropriate. The patient has been provided an accurate and updated list of the medications being now prescribed. Patient expressed understanding of how their medications were to be used.  Pt verbalized understanding and verbal consent obtained for treatment.  The risk of un-intended pregnancy is low based on the fact that pt reports she is taking OCP. Pt is aware that these meds carry a teratogenic risk. Pt will discuss plan of action if she does or plans to become pregnant in the future.  Status of current problems: worsening depression  Meds: increase Celexa 30 mg p.o. daily for PMDD and GAD Lamictal 100 mg p.o. daily for PMDD and potential bipolar disorder Neurontin 300 mg p.o. 4 times daily for off label treatment of GAD Due to history of polysubstance abuse will avoid all scheduled medications when possible  Labs: none  Therapy: brief supportive therapy provided. Discussed psychosocial stressors in detail.    Consultations: Encouraged to follow up with PCP as needed Encouraged pt to continue AA meetings and validated her continued sobriety- daily online Encouraged to continue therapy here at [facility]- online  Pt denies SI and is at an acute low risk for suicide. Patient told to call clinic if any problems occur. Patient advised to go to ER if they should develop SI/HI, side effects, or if symptoms worsen. Has crisis numbers to call if needed. Pt verbalized understanding.  F/up in 2 months or sooner if needed  The duration of this appointment visit  was 20 minutes of non face-to-face time with the patient.  Greater than  50% of this time was spent in counseling, explanation of  diagnosis, planning of further management, and coordination of care    Oletta DarterSalina Khani Paino, MD 02/13/2019, 9:54 AM

## 2019-02-17 ENCOUNTER — Encounter (HOSPITAL_COMMUNITY): Payer: Self-pay | Admitting: Licensed Clinical Social Worker

## 2019-02-17 ENCOUNTER — Ambulatory Visit (INDEPENDENT_AMBULATORY_CARE_PROVIDER_SITE_OTHER): Payer: Self-pay | Admitting: Licensed Clinical Social Worker

## 2019-02-17 ENCOUNTER — Other Ambulatory Visit: Payer: Self-pay

## 2019-02-17 DIAGNOSIS — F411 Generalized anxiety disorder: Secondary | ICD-10-CM

## 2019-02-17 DIAGNOSIS — F3181 Bipolar II disorder: Secondary | ICD-10-CM

## 2019-02-17 NOTE — Progress Notes (Signed)
Virtual Visit via Video Note  I connected with Patricia Rivas on 02/17/19 at 11:00 AM EDT by a video enabled telemedicine application and verified that I am speaking with the correct person using two identifiers.   I discussed the limitations of evaluation and management by telemedicine and the availability of in person appointments. The patient expressed understanding and agreed to proceed.  Type of Therapy: Individual Therapy   Treatment Goals addressed: ""eat healthy, get control over emotional binge eating, quit smoking, and learn how to let my dad in".   Interventions: Motivational Interviewing, Cognitive Behavioral Therapy, Grounding and Mindfulness Techniques   Summary: Patricia Rivas is a 38 y.o. female who presents with Generalized Anxiety Disorder and Bipolar II.   Suicidal/Homicidal: No - without intent/plan   Therapist Response:  Patricia Rivas met with clinician for an individual session. Patricia Rivas discussed her psychiatric symptoms, her current life events and her homework. Patricia Rivas shared that Dr. Doyne Keel increased her Celexa due to some increased daytime naps and anxiety. Clinician validated that decision and normalized the need for increased rest, due to high levels of environmental stress. Clinician discussed the importance of doing what works at the moment for [employer], as long as it does not interfere with her sobriety or other responsibilities. Clinician discussed relationship with dad and noted that keeping the relationship casual and not reading too much into brief good morning texts will be helpful. Clinician reflected thoughts and feelings about coping skills and discussed ways to improve communication with family.   Plan: Return again in 2 weeks.   Diagnosis: Axis I: Generalized Anxiety Disorder and Bipolar II.      I discussed the assessment and treatment plan with the patient. The patient was provided an opportunity to ask questions and all were answered. The patient  agreed with the plan and demonstrated an understanding of the instructions.   The patient was advised to call back or seek an in-person evaluation if the symptoms worsen or if the condition fails to improve as anticipated.  I provided 55 minutes of non-face-to-face time during this encounter.   [name], LCSW

## 2019-02-18 ENCOUNTER — Encounter

## 2019-03-06 ENCOUNTER — Ambulatory Visit (INDEPENDENT_AMBULATORY_CARE_PROVIDER_SITE_OTHER): Payer: 59 | Admitting: Licensed Clinical Social Worker

## 2019-03-06 ENCOUNTER — Other Ambulatory Visit: Payer: Self-pay

## 2019-03-06 ENCOUNTER — Encounter (HOSPITAL_COMMUNITY): Payer: Self-pay | Admitting: Licensed Clinical Social Worker

## 2019-03-06 DIAGNOSIS — F3181 Bipolar II disorder: Secondary | ICD-10-CM | POA: Diagnosis not present

## 2019-03-06 DIAGNOSIS — F411 Generalized anxiety disorder: Secondary | ICD-10-CM | POA: Diagnosis not present

## 2019-03-06 NOTE — Progress Notes (Signed)
Virtual Visit via Video Note  I connected with Patricia Rivas on 03/06/19 at 10:00 AM EDT by a video enabled telemedicine application and verified that I am speaking with the correct person using two identifiers.     I discussed the limitations of evaluation and management by telemedicine and the availability of in person appointments. The patient expressed understanding and agreed to proceed.  Type of Therapy: Individual Therapy   Treatment Goals addressed: ""eat healthy, get control over emotional binge eating, quit smoking, and learn how to let my dad in".   Interventions: Motivational Interviewing, Cognitive Behavioral Therapy, Grounding and Mindfulness Techniques   Summary: Patricia Rivas is a 38 y.o. female who presents with Generalized Anxiety Disorder and Bipolar II.   Suicidal/Homicidal: No - without intent/plan   Therapist Response:  Patricia Rivas met with clinician for an individual session. Patricia Rivas discussed her psychiatric symptoms, her current life events and her homework. Patricia Rivas shared that she has been a little more irritable and less patient with her daughter over the past month. She reported increase in "attitude" from daughter. Clinician validated this experience and processed developmental and environmental factors present. Clinician also discussed the importance of self care during this time, as we do not have much personal time, working from home. Clinician explored options for meditation, mindfulness, yoga, and walks as options for self care. Clinician also encouraged Patricia Rivas to find ways to enjoy 1:1 time with daughter.    Plan: Return again in 2 weeks.   Diagnosis: Axis I: Generalized Anxiety Disorder and Bipolar II.    I discussed the assessment and treatment plan with the patient. The patient was provided an opportunity to ask questions and all were answered. The patient agreed with the plan and demonstrated an understanding of the instructions.   The patient was  advised to call back or seek an in-person evaluation if the symptoms worsen or if the condition fails to improve as anticipated.  I provided 55 minutes of non-face-to-face time during this encounter.   [name], LCSW

## 2019-03-20 ENCOUNTER — Other Ambulatory Visit: Payer: Self-pay

## 2019-03-20 ENCOUNTER — Ambulatory Visit (HOSPITAL_COMMUNITY): Payer: 59 | Admitting: Licensed Clinical Social Worker

## 2019-04-07 ENCOUNTER — Encounter (HOSPITAL_COMMUNITY): Payer: Self-pay | Admitting: Licensed Clinical Social Worker

## 2019-04-07 ENCOUNTER — Ambulatory Visit (INDEPENDENT_AMBULATORY_CARE_PROVIDER_SITE_OTHER): Payer: 59 | Admitting: Licensed Clinical Social Worker

## 2019-04-07 ENCOUNTER — Other Ambulatory Visit: Payer: Self-pay

## 2019-04-07 DIAGNOSIS — F411 Generalized anxiety disorder: Secondary | ICD-10-CM | POA: Diagnosis not present

## 2019-04-07 DIAGNOSIS — F3181 Bipolar II disorder: Secondary | ICD-10-CM

## 2019-04-07 NOTE — Progress Notes (Signed)
Virtual Visit via Video Note  I connected with Patricia Rivas on 04/07/19 at  9:00 AM EDT by a video enabled telemedicine application and verified that I am speaking with the correct person using two identifiers.   I discussed the limitations of evaluation and management by telemedicine and the availability of in person appointments. The patient expressed understanding and agreed to proceed.  Type of Therapy: Individual Therapy  Treatment Goals addressed: ""eat healthy, get control over emotional binge eating, quit smoking, and learn how to let my dad in".  Interventions: Motivational Interviewing, Cognitive Behavioral Therapy, Grounding and Mindfulness Techniques  Summary: Patricia Rivas is a 38 y.o. female who presents with Generalized Anxiety Disorder and Bipolar II.  Suicidal/Homicidal: No - without intent/plan  Therapist Response:  Patricia Rivas met with clinician for an individual session. Patricia Rivas discussed her psychiatric symptoms, her current life events and her homework. Patricia Rivas shared that over the past two weeks, she has become very motivated to lose weight, exercise, and think positively. Clinician processed this change and discussed ways to maintain these new habits. Patricia Rivas reports she started walking at [facility] every morning before work, which has been great for her exercise, eight loss, and she is cooking healthier meals. She reports no smoking for 3 months, but still using the nicorette lozenges. Clinician validated this transition and explored supports to maintain positive changes. Clinician discussed interactions at home and noted ongoing improvements. Patricia Rivas processed some parenting challenges. Clinician encouraged the use of choices "big choices for big kids, little choices for little kids". Clinician reflected and summarized the adaptation of the "new normal", as she will likely not return to her office until after Labor Day.   Plan: Return again in 2  weeks.  Diagnosis: Axis I: Generalized Anxiety Disorder and Bipolar II.   I discussed the assessment and treatment plan with the patient. The patient was provided an opportunity to ask questions and all were answered. The patient agreed with the plan and demonstrated an understanding of the instructions.   The patient was advised to call back or seek an in-person evaluation if the symptoms worsen or if the condition fails to improve as anticipated.  I provided 60 minutes of non-face-to-face time during this encounter.   [name], LCSW

## 2019-04-10 ENCOUNTER — Ambulatory Visit (INDEPENDENT_AMBULATORY_CARE_PROVIDER_SITE_OTHER): Payer: 59 | Admitting: Psychiatry

## 2019-04-10 ENCOUNTER — Encounter (HOSPITAL_COMMUNITY): Payer: Self-pay | Admitting: Psychiatry

## 2019-04-10 ENCOUNTER — Other Ambulatory Visit: Payer: Self-pay

## 2019-04-10 DIAGNOSIS — F3281 Premenstrual dysphoric disorder: Secondary | ICD-10-CM

## 2019-04-10 DIAGNOSIS — F411 Generalized anxiety disorder: Secondary | ICD-10-CM | POA: Diagnosis not present

## 2019-04-10 MED ORDER — LAMOTRIGINE 100 MG PO TABS: ORAL_TABLET | ORAL | 0 refills | Status: DC

## 2019-04-10 MED ORDER — GABAPENTIN 300 MG PO CAPS: ORAL_CAPSULE | ORAL | 0 refills | Status: DC

## 2019-04-10 MED ORDER — CITALOPRAM HYDROBROMIDE 10 MG PO TABS: 30.0000 mg | ORAL_TABLET | Freq: Every day | ORAL | 0 refills | Status: DC

## 2019-04-10 NOTE — Progress Notes (Signed)
Virtual Visit via Telephone Note  I connected with Patricia Rivas on 04/10/19 at 11:00 AM EDT by telephone and verified that I am speaking with the correct person using two identifiers.  Location: Patient: home Provider: office   I discussed the limitations, risks, security and privacy concerns of performing an evaluation and management service by telephone and the availability of in person appointments. I also discussed with the patient that there may be a patient responsible charge related to this service. The patient expressed understanding and agreed to proceed.   History of Present Illness: "I am doing good. I am really good". Pt is working from home and expects to do so for several more months. Her husband is still home and not working. They are not financially struggling. He has been doing some housework and will get a little money for doing some with the neighbors. Pt and family are mostly quarantining. [name] does expect that with the infection rate increasing we will be back in stay at home at sometime. She is going to the beach in July and looking forward to it. [name] is overwhelmed by all the things she has to do. It has improved with the increase in Celexa. She has some bad days but they are less and less. She rarely naps in the afternoon. Pt feels she is doing really well. She is walking and exercising which is helping. Sleep is good and she is getting 8 hrs. Her depression and irritability surrounding her period has dramatically improved. Pt denies SI/HI. Overall she feels she doing great and has no concerns. Pt does daily AA meetings.    Observations/Objective: I spoke with Patricia Rivas on the phone.  Pt was calm, pleasant and cooperative.  Pt was engaged in the conversation and answered questions appropriately.  Speech was clear and coherent with normal rate, tone and volume.  Mood is euthymic and affect is full. Thought processes are coherent and intact.  Thought content  is with logical.  Pt denies SI/HI.   Pt denies auditory and visual hallucinations and did not appear to be responding to internal stimuli.  Memory and concentration are good.  Fund of knowledge and use of language are average.  Insight and judgment are fair.  I am unable to comment on psychomotor activity, general appearance, hygiene, or eye contact as I was unable to physically see the patient on the phone.   Assessment and Plan: Premenstrual dysphoric syndrome; GAD; alcohol dependence in remission; polysubstance abuse ( opiates, marijuana, benzos, cocaine) in remission; rule out bipolar 2 disorder; history of ADHD  Continue Celexa 30 mg p.o. daily for PMDD and GAD Lamictal 100 mg p.o. daily Neurontin 300 mg p.o. 4 times daily  Due to history of polysubstance abuse I will avoid treatment with any and all scheduled medications when possible    Follow Up Instructions: In 3 months or sooner if needed   I discussed the assessment and treatment plan with the patient. The patient was provided an opportunity to ask questions and all were answered. The patient agreed with the plan and demonstrated an understanding of the instructions.   The patient was advised to call back or seek an in-person evaluation if the symptoms worsen or if the condition fails to improve as anticipated.  I provided 15 minutes of non-face-to-face time during this encounter.   Charlcie Cradle, MD

## 2019-04-22 ENCOUNTER — Ambulatory Visit (HOSPITAL_COMMUNITY): Payer: 59 | Admitting: Licensed Clinical Social Worker

## 2019-04-22 ENCOUNTER — Other Ambulatory Visit: Payer: Self-pay

## 2019-07-03 ENCOUNTER — Other Ambulatory Visit: Payer: Self-pay

## 2019-07-03 ENCOUNTER — Ambulatory Visit (INDEPENDENT_AMBULATORY_CARE_PROVIDER_SITE_OTHER): Payer: 59 | Admitting: Psychiatry

## 2019-07-03 ENCOUNTER — Encounter (HOSPITAL_COMMUNITY): Payer: Self-pay | Admitting: Psychiatry

## 2019-07-03 DIAGNOSIS — F411 Generalized anxiety disorder: Secondary | ICD-10-CM

## 2019-07-03 DIAGNOSIS — F3281 Premenstrual dysphoric disorder: Secondary | ICD-10-CM | POA: Diagnosis not present

## 2019-07-03 DIAGNOSIS — F1921 Other psychoactive substance dependence, in remission: Secondary | ICD-10-CM | POA: Diagnosis not present

## 2019-07-03 MED ORDER — LAMOTRIGINE 100 MG PO TABS: ORAL_TABLET | ORAL | 0 refills | Status: DC

## 2019-07-03 MED ORDER — CITALOPRAM HYDROBROMIDE 10 MG PO TABS: 30.0000 mg | ORAL_TABLET | Freq: Every day | ORAL | 0 refills | Status: DC

## 2019-07-03 MED ORDER — GABAPENTIN 300 MG PO CAPS: ORAL_CAPSULE | ORAL | 0 refills | Status: DC

## 2019-07-03 NOTE — Progress Notes (Signed)
Virtual Visit via Video Note  I connected with Patricia Rivas on 07/03/19 at 10:00 AM EDT by a video enabled telemedicine application and verified that I am speaking with the correct person using two identifiers.  Location: Patient: home Provider: office   I discussed the limitations of evaluation and management by telemedicine and the availability of in person appointments. The patient expressed understanding and agreed to proceed.  History of Present Illness: Jim states that she is doing well.  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  It is stressful but she is doing her best.  Her mood is been stable.  She does have sad mood and irritability around her.  Time but is able to recognize it and deal with it appropriately.  Marando walks for 2 to 3 miles each day.  She states that the increase in Celexa really helped with her depression and anxiety.  She is denying any issues with insomnia.  She denies SI/HI.  She is denying any illicit drug use.  She remains active in NA.  Toney Rakes has "graduated" from therapy.   Observations/Objective:  unknown if currently breastfeeding.There is no height or weight on file to calculate BMI.  General Appearance: Casual and Well Groomed  Eye Contact:  Good  Speech:  Clear and Coherent and Normal Rate  Volume:  Normal  Mood:  Euthymic  Affect:  Full Range  Thought Process:  Goal Directed and Descriptions of Associations: Intact  Orientation:  Full (Time, Place, and Person)  Thought Content:  Logical  Suicidal Thoughts:  No  Homicidal Thoughts:  No  Memory:  Immediate;   Good  Judgement:  Good  Insight:  Good  Psychomotor Activity:  Normal  Concentration:  Concentration: Good and Attention Span: Good  Recall:  Good  Fund of Knowledge:  Good  Language:  Good  Akathisia:  No  Handed:  Right  AIMS (if indicated):     Assets:  Communication Skills Desire for Improvement Financial  Resources/Insurance Housing Intimacy Leisure Time Physical Health Resilience Social Support Talents/Skills Transportation Vocational/Educational  ADL's:  Intact  Cognition:  WNL  Sleep:   good     Assessment and Plan: Premenstrual dysphoric syndrome; GAD; alcohol dependence in remission; polysubstance abuse (opiates, marijuana, benzos, cocaine) in remission; history of ADHD; rule out bipolar 2 disorder  Continue: Celexa 30 mg p.o. daily for PMDD and GAD Lamictal 100 mg p.o. daily for PMDD Neurontin 300 mg p.o. 4 times daily for off label treatment of GAD  Due to history of polysubstance abuse I will avoid treatment with scheduled medications when possible    Follow Up Instructions: In 3 months or sooner if needed   I discussed the assessment and treatment plan with the patient. The patient was provided an opportunity to ask questions and all were answered. The patient agreed with the plan and demonstrated an understanding of the instructions.   The patient was advised to call back or seek an in-person evaluation if the symptoms worsen or if the condition fails to improve as anticipated.  I provided 15 minutes of non-face-to-face time during this encounter.   Charlcie Cradle, MD

## 2019-07-14 ENCOUNTER — Other Ambulatory Visit (HOSPITAL_COMMUNITY): Payer: Self-pay | Admitting: Psychiatry

## 2019-07-14 DIAGNOSIS — F411 Generalized anxiety disorder: Secondary | ICD-10-CM

## 2019-07-21 ENCOUNTER — Other Ambulatory Visit (HOSPITAL_COMMUNITY): Payer: Self-pay | Admitting: Psychiatry

## 2019-07-21 DIAGNOSIS — F411 Generalized anxiety disorder: Secondary | ICD-10-CM

## 2019-07-21 DIAGNOSIS — F3281 Premenstrual dysphoric disorder: Secondary | ICD-10-CM

## 2019-08-21 ENCOUNTER — Other Ambulatory Visit (HOSPITAL_COMMUNITY): Payer: Self-pay | Admitting: Psychiatry

## 2019-08-21 DIAGNOSIS — F3281 Premenstrual dysphoric disorder: Secondary | ICD-10-CM

## 2019-08-21 DIAGNOSIS — F411 Generalized anxiety disorder: Secondary | ICD-10-CM

## 2019-09-13 ENCOUNTER — Other Ambulatory Visit (HOSPITAL_COMMUNITY): Payer: Self-pay | Admitting: Psychiatry

## 2019-09-13 DIAGNOSIS — F3281 Premenstrual dysphoric disorder: Secondary | ICD-10-CM

## 2019-09-13 DIAGNOSIS — F411 Generalized anxiety disorder: Secondary | ICD-10-CM

## 2019-09-25 ENCOUNTER — Ambulatory Visit (INDEPENDENT_AMBULATORY_CARE_PROVIDER_SITE_OTHER): Payer: 59 | Admitting: Psychiatry

## 2019-09-25 ENCOUNTER — Encounter (HOSPITAL_COMMUNITY): Payer: Self-pay | Admitting: Psychiatry

## 2019-09-25 ENCOUNTER — Other Ambulatory Visit: Payer: Self-pay

## 2019-09-25 DIAGNOSIS — F411 Generalized anxiety disorder: Secondary | ICD-10-CM | POA: Diagnosis not present

## 2019-09-25 DIAGNOSIS — F3281 Premenstrual dysphoric disorder: Secondary | ICD-10-CM

## 2019-09-25 MED ORDER — GABAPENTIN 300 MG PO CAPS: ORAL_CAPSULE | ORAL | 0 refills | Status: DC

## 2019-09-25 MED ORDER — CITALOPRAM HYDROBROMIDE 10 MG PO TABS: 30.0000 mg | ORAL_TABLET | Freq: Every day | ORAL | 0 refills | Status: DC

## 2019-09-25 MED ORDER — LAMOTRIGINE 100 MG PO TABS: ORAL_TABLET | ORAL | 0 refills | Status: DC

## 2019-09-25 NOTE — Progress Notes (Signed)
Virtual Visit via Video Note  I connected with Patricia Rivas on 09/25/19 at 11:30 AM EST by a video enabled telemedicine application and verified that I am speaking with the correct person using two identifiers.  Location: Patient: home Provider: office   I discussed the limitations of evaluation and management by telemedicine and the availability of in person appointments. The patient expressed understanding and agreed to proceed.  History of Present Illness: Patricia Rivas states that she is doing well.  They are attempting to find the "silver lining "in all of the current stressors going on.  They spend Thanksgiving at home together and it was all right.  Her daughter is recently returned to in person school and they both like that.  Patricia Rivas was somewhat disappointed to find that she was going to the forced to work from home and till July at least.  She states that her mood is overall stable.  Her period is about to start in 1 week and she notes that yesterday she had a "hypomanic" couple of hours.  During this time she was highly motivated and energetic and was able to complete a lot of housework.  She recognize that this could possibly be some hypomanic-like behavior.  She did try some calming coping skills and it did seem to work.  Her sleep has been okay and she is getting about 8 hours.  Anxiety is manageable.  She denies SI/HI.  Patricia Rivas denies any substance abuse.   Observations/Objective:  General Appearance: Fairly Groomed  Eye Contact:  Good  Speech:  Clear and Coherent and Normal Rate  Volume:  Normal  Mood:  Euthymic  Affect:  Full Range  Thought Process:  Goal Directed, Linear and Descriptions of Associations: Intact  Orientation:  Full (Time, Place, and Person)  Thought Content:  Logical  Suicidal Thoughts:  No  Homicidal Thoughts:  No  Memory:  Immediate;   Good Recent;   Good  Judgement:  Good  Insight:  Good  Psychomotor Activity:  Normal  Concentration:  Concentration:  Good and Attention Span: Good  Recall:  Good  Fund of Knowledge:  Good  Language:  Good  Akathisia:  No  Handed:  Right  AIMS (if indicated):     Assets:  Communication Skills Desire for Improvement Financial Resources/Insurance Housing Intimacy Leisure Time Physical Health Resilience Social Support Talents/Skills Transportation Vocational/Educational  ADL's:  Intact  Cognition:  WNL  Sleep:       I reviewed the information below on 09/25/2019 and have updated it Assessment and Plan: Premenstrual dysphoric syndrome; GAD; alcohol dependence in remission; polysubstance abuse (opiates, marijuana, benzos, cocaine) in remission; history of ADHD; rule out bipolar 2 disorder   Continue: -Celexa 30 mg p.o. daily for PMDD and GAD -Lamictal 100 mg p.o. daily for PMDD -Neurontin 300 mg p.o. 4 times daily for off label treatment of GAD   Due to history of polysubstance abuse I will avoid treatment with scheduled medications when possible   Follow Up Instructions: In 3 months or sooner if needed   I discussed the assessment and treatment plan with the patient. The patient was provided an opportunity to ask questions and all were answered. The patient agreed with the plan and demonstrated an understanding of the instructions.   The patient was advised to call back or seek an in-person evaluation if the symptoms worsen or if the condition fails to improve as anticipated.  I provided 20 minutes of non-face-to-face time during this encounter.   [employer]  Doyne Keel, MD

## 2019-11-20 ENCOUNTER — Other Ambulatory Visit: Payer: Self-pay

## 2019-11-20 ENCOUNTER — Ambulatory Visit (INDEPENDENT_AMBULATORY_CARE_PROVIDER_SITE_OTHER): Payer: 59 | Admitting: Licensed Clinical Social Worker

## 2019-11-20 ENCOUNTER — Encounter (HOSPITAL_COMMUNITY): Payer: Self-pay | Admitting: Licensed Clinical Social Worker

## 2019-11-20 DIAGNOSIS — F411 Generalized anxiety disorder: Secondary | ICD-10-CM | POA: Diagnosis not present

## 2019-11-20 DIAGNOSIS — F3181 Bipolar II disorder: Secondary | ICD-10-CM

## 2019-11-20 NOTE — Progress Notes (Signed)
Virtual Visit via Video Note  I connected with Renny L Vessels on 11/20/19 at  8:00 AM EST by a video enabled telemedicine application and verified that I am speaking with the correct person using two identifiers.     I discussed the limitations of evaluation and management by telemedicine and the availability of in person appointments. The patient expressed understanding and agreed to proceed.  Type of Therapy: Individual Therapy  Treatment Goals addressed: ""eat healthy, get control over emotional binge eating, quit smoking, and learn how to let my dad in".  Interventions: Motivational Interviewing, Cognitive Behavioral Therapy, Grounding and Mindfulness Techniques  Summary: Corita Allinson is a 39 y.o. female who presents with Generalized Anxiety Disorder and Bipolar II.  Suicidal/Homicidal: No - without intent/plan  Therapist Response:  Martena met with clinician for an individual session. Dazia discussed her psychiatric symptoms, her current life events and her homework. Sedra shared that she had been maintaining her motivation to get healthy, maintain her sobriety, continue with quitting smoking, and meditate daily until Christmas. She reported starting to let things go since she was doing so well. She also reported that while she is still not drinking or smoking, she has started eating and stopped her exercise routine. Clinician reflected and validated this experience. Clinician identified the importance of seeing her extreme level of motivation as a similar experience to her addiction, if some is good, more is better attitude. Clinician encouraged Navada to start back into her routine of meditating in the mornings, doing her exercises, some but not all, and to be kind to herself. Clinician taught a mindfulness technique which will assist in blocking off other people's feelings so she does not absorb negativity. Clinician also identified the importance of adding sensory  information into mindfulness to get a deeper experience.   Plan: Return again in 2 weeks.  Diagnosis: Axis I: Generalized Anxiety Disorder and Bipolar II.   I discussed the assessment and treatment plan with the patient. The patient was provided an opportunity to ask questions and all were answered. The patient agreed with the plan and demonstrated an understanding of the instructions.   The patient was advised to call back or seek an in-person evaluation if the symptoms worsen or if the condition fails to improve as anticipated.  I provided 45 minutes of non-face-to-face time during this encounter.   [name], LCSW

## 2019-12-04 ENCOUNTER — Other Ambulatory Visit: Payer: Self-pay

## 2019-12-04 ENCOUNTER — Ambulatory Visit (INDEPENDENT_AMBULATORY_CARE_PROVIDER_SITE_OTHER): Payer: 59 | Admitting: Licensed Clinical Social Worker

## 2019-12-04 ENCOUNTER — Encounter (HOSPITAL_COMMUNITY): Payer: Self-pay | Admitting: Licensed Clinical Social Worker

## 2019-12-04 DIAGNOSIS — F411 Generalized anxiety disorder: Secondary | ICD-10-CM

## 2019-12-04 DIAGNOSIS — F3181 Bipolar II disorder: Secondary | ICD-10-CM

## 2019-12-04 NOTE — Progress Notes (Signed)
Virtual Visit via Video Note  I connected with Patricia Rivas on 12/04/19 at  8:00 AM EST by a video enabled telemedicine application and verified that I am speaking with the correct person using two identifiers.     I discussed the limitations of evaluation and management by telemedicine and the availability of in person appointments. The patient expressed understanding and agreed to proceed.    Type of Therapy: Individual Therapy  Treatment Goals addressed: "eat healthy, get control over emotional binge eating, quit smoking, and learn how to let my dad in". Patricia Rivas will report improvement in her mood, behaviors, and her relationships 5 out of 7 days.   Interventions: Motivational Interviewing, Cognitive Behavioral Therapy, psychoeducation  Summary: Patricia Rivas is a 39 y.o. female who presents with Generalized Anxiety Disorder and Bipolar II.  Suicidal/Homicidal: No - without intent/plan  Therapist Response:  Patricia Rivas met with clinician for an individual session. Patricia Rivas discussed her psychiatric symptoms, her current life events and her homework. Patricia Rivas shared thatshe still is feeling incredibly angry with her father for his lack of involvement in her life. Clinician utilized MI OARS to reflect and summarize thoughts and feelings. Clinician explored what triggers are present at this time, which is fueling her anger. Clinician provided CBT psychoeducation about trauma and how it is reflected and remembered at the age or developmental stage in which it happened. Clinician validated frustration and also noted that it is not just her 39 year old self that is hurt, but her 39 year old self that wanted to have a relationship with dad at that time and did not. Clinician provided psychoeducation about attachment, object permanence, and how young children and then older children act on that attachment. Patricia Rivas reported that she understood and could relate to these attachment behaviors, as  she was an explosive child, particularly with mom. Clinician identified the likelihood that because relationship with mom was more secure, she felt safer acting out with or toward her, rather than with dad. Clinician encouraged Patricia Rivas to journal about these feelings and to spend time writing to her younger self to validate those feelings and work on healing herself.     Plan: Return again in 2 weeks.  Diagnosis: Axis I: Generalized Anxiety Disorder and Bipolar II.     I discussed the assessment and treatment plan with the patient. The patient was provided an opportunity to ask questions and all were answered. The patient agreed with the plan and demonstrated an understanding of the instructions.   The patient was advised to call back or seek an in-person evaluation if the symptoms worsen or if the condition fails to improve as anticipated.  I provided 45 minutes of non-face-to-face time during this encounter.   [name], LCSW

## 2019-12-20 ENCOUNTER — Other Ambulatory Visit (HOSPITAL_COMMUNITY): Payer: Self-pay | Admitting: Psychiatry

## 2019-12-20 DIAGNOSIS — F411 Generalized anxiety disorder: Secondary | ICD-10-CM

## 2019-12-20 DIAGNOSIS — F3281 Premenstrual dysphoric disorder: Secondary | ICD-10-CM

## 2019-12-22 ENCOUNTER — Ambulatory Visit (HOSPITAL_COMMUNITY): Payer: 59 | Admitting: Licensed Clinical Social Worker

## 2019-12-25 ENCOUNTER — Other Ambulatory Visit: Payer: Self-pay

## 2019-12-25 ENCOUNTER — Encounter (HOSPITAL_COMMUNITY): Payer: Self-pay | Admitting: Psychiatry

## 2019-12-25 ENCOUNTER — Ambulatory Visit (INDEPENDENT_AMBULATORY_CARE_PROVIDER_SITE_OTHER): Payer: 59 | Admitting: Psychiatry

## 2019-12-25 DIAGNOSIS — F411 Generalized anxiety disorder: Secondary | ICD-10-CM | POA: Diagnosis not present

## 2019-12-25 DIAGNOSIS — F3281 Premenstrual dysphoric disorder: Secondary | ICD-10-CM

## 2019-12-25 MED ORDER — CITALOPRAM HYDROBROMIDE 10 MG PO TABS: 30.0000 mg | ORAL_TABLET | Freq: Every day | ORAL | 0 refills | Status: DC

## 2019-12-25 MED ORDER — LAMOTRIGINE 100 MG PO TABS: ORAL_TABLET | ORAL | 0 refills | Status: DC

## 2019-12-25 MED ORDER — GABAPENTIN 300 MG PO CAPS: ORAL_CAPSULE | ORAL | 0 refills | Status: DC

## 2019-12-25 NOTE — Progress Notes (Signed)
Virtual Visit via Telephone Note  I connected with Patricia Rivas on 12/25/19 at 11:30 AM EST by telephone and verified that I am speaking with the correct person using two identifiers.  Location: Patient: home Provider: office   I discussed the limitations, risks, security and privacy concerns of performing an evaluation and management service by telephone and the availability of in person appointments. I also discussed with the patient that there may be a patient responsible charge related to this service. The patient expressed understanding and agreed to proceed.   History of Present Illness: "Doing a lot better". Her mood was irritated and gloomy over the winter. Now that the sun is out more she is feeling better. She is using Noom for weight loss and it is helping. Patricia Rivas is taking it day by day. She continues to experience moodiness around her menstrual cycle. She celebrated 10 yrs of sobriety last month and 1 yr of smoking cessation. Patricia Rivas is trying to re-commit to AA. She denies any anxiety. She denies SI/HI.    Observations/Objective:  General Appearance: unable to assess  Eye Contact:  unable to assess  Speech:  Clear and Coherent and Normal Rate  Volume:  Normal  Mood:  Euthymic  Affect:  Full Range  Thought Process:  Goal Directed, Linear and Descriptions of Associations: Intact  Orientation:  Full (Time, Place, and Person)  Thought Content:  Logical  Suicidal Thoughts:  No  Homicidal Thoughts:  No  Memory:  Immediate;   Good  Judgement:  Good  Insight:  Good  Psychomotor Activity: unable to assess  Concentration:  Concentration: Good  Recall:  Good  Fund of Knowledge:  Good  Language:  Good  Akathisia:  unable to assess  Handed:  Right  AIMS (if indicated):     Assets:  Communication Skills Desire for Improvement Financial Resources/Insurance Housing Intimacy Leisure Time Resilience Social Support Talents/Skills Transportation Vocational/Educational   ADL's:  unable to assess  Cognition:  WNL  Sleep:        I reviewed the information below on 12/25/19 and have updated it Assessment and Plan: Premenstrual dysphoric syndrome; GAD; alcohol dependence in remission; polysubstance abuse (opiates, marijuana, benzos, cocaine) in remission; history of ADHD; rule out bipolar 2 disorder   Continue: -Celexa 30 mg p.o. daily for PMDD and GAD -Lamictal 100 mg p.o. daily for PMDD -Neurontin 300 mg p.o. 4 times daily for off label treatment of GAD   Due to history of polysubstance abuse I will avoid treatment with scheduled medications when possible  - using light therapy and it is helping  Follow Up Instructions: In 3 months or sooner if needed   I discussed the assessment and treatment plan with the patient. The patient was provided an opportunity to ask questions and all were answered. The patient agreed with the plan and demonstrated an understanding of the instructions.   The patient was advised to call back or seek an in-person evaluation if the symptoms worsen or if the condition fails to improve as anticipated.  I provided 15 minutes of non-face-to-face time during this encounter.   [name], MD

## 2020-01-08 ENCOUNTER — Encounter (HOSPITAL_COMMUNITY): Payer: Self-pay | Admitting: Licensed Clinical Social Worker

## 2020-01-08 ENCOUNTER — Ambulatory Visit (INDEPENDENT_AMBULATORY_CARE_PROVIDER_SITE_OTHER): Payer: 59 | Admitting: Licensed Clinical Social Worker

## 2020-01-08 ENCOUNTER — Other Ambulatory Visit: Payer: Self-pay

## 2020-01-08 DIAGNOSIS — F411 Generalized anxiety disorder: Secondary | ICD-10-CM

## 2020-01-08 DIAGNOSIS — F3181 Bipolar II disorder: Secondary | ICD-10-CM | POA: Diagnosis not present

## 2020-01-08 NOTE — Progress Notes (Signed)
Virtual Visit via Video Note  I connected with Patricia Rivas on 01/08/20 at  8:00 AM EDT by a video enabled telemedicine application and verified that I am speaking with the correct person using two identifiers.     I Rivas the limitations of evaluation and management by telemedicine and the availability of in person appointments. The patient expressed understanding and agreed to proceed.  Type of Therapy: Individual Therapy   Treatment Goals addressed: "eat healthy, get control over emotional binge eating, quit smoking, and learn how to let my dad in". Patricia Rivas will report improvement in her mood, behaviors, and her relationships 5 out of 7 days.    Interventions:  Cognitive Behavioral Therapy   Summary: Patricia Rivas is a 39 y.o. female who presents with Generalized Anxiety Disorder and Bipolar II.   Suicidal/Homicidal: No - without intent/plan   Therapist Response:   Patricia Rivas met with Patricia Rivas for an individual session. Patricia Rivas her psychiatric symptoms, her current life events and her homework. Patricia Rivas identified increased anxiety and snippiness with her family recently. Patricia Rivas processed through increased anxiety and explored self care activities. Patricia Rivas noted that self care or lack of self care is something that often helps [name] maintain our cool when things feel out of control. Patricia Rivas utilized CBT to challenge cognitive distortion "shoulds". Patricia Rivas processed the expectations and reminded her how having unrealistic expectations can result in resentments. Patricia Rivas encouraged Patricia Rivas to return to her routine of exercise. Patricia Rivas also processed some concerns about being controlling and encouraged her to work on letting go of things she cannot control.    Plan: Return again in 2 weeks.   Diagnosis: Axis I: Generalized Anxiety Disorder and Bipolar II.    I Rivas the assessment and treatment plan with the patient. The patient was provided an opportunity to  ask questions and all were answered. The patient agreed with the plan and demonstrated an understanding of the instructions.   The patient was advised to call back or seek an in-person evaluation if the symptoms worsen or if the condition fails to improve as anticipated.  I provided 45 minutes of non-face-to-face time during this encounter.   [name], LCSW

## 2020-01-22 ENCOUNTER — Ambulatory Visit (HOSPITAL_COMMUNITY): Payer: 59 | Admitting: Licensed Clinical Social Worker

## 2020-01-29 ENCOUNTER — Ambulatory Visit (INDEPENDENT_AMBULATORY_CARE_PROVIDER_SITE_OTHER): Payer: 59 | Admitting: Licensed Clinical Social Worker

## 2020-01-29 ENCOUNTER — Encounter (HOSPITAL_COMMUNITY): Payer: Self-pay | Admitting: Licensed Clinical Social Worker

## 2020-01-29 ENCOUNTER — Other Ambulatory Visit: Payer: Self-pay

## 2020-01-29 DIAGNOSIS — F411 Generalized anxiety disorder: Secondary | ICD-10-CM | POA: Diagnosis not present

## 2020-01-29 DIAGNOSIS — F3181 Bipolar II disorder: Secondary | ICD-10-CM

## 2020-01-29 NOTE — Progress Notes (Signed)
Virtual Visit via Video Note  I connected with Patricia Rivas on 01/29/20 at  9:00 AM EDT by a video enabled telemedicine application and verified that I am speaking with the correct person using two identifiers.     I discussed the limitations of evaluation and management by telemedicine and the availability of in person appointments. The patient expressed understanding and agreed to proceed.   Type of Therapy: Individual Therapy   Treatment Goals addressed: "eat healthy, get control over emotional binge eating, quit smoking, and learn how to let my dad in". Patricia Rivas will report improvement in her mood, behaviors, and her relationships 5 out of 7 days.    Interventions:  Motivational Interviewing   Summary: Patricia Rivas is a 38 y.o. female who presents with Generalized Anxiety Disorder and Bipolar II.   Suicidal/Homicidal: No - without intent/plan   Therapist Response:   Patricia Rivas met with Patricia Rivas for an individual session. Patricia Rivas discussed her psychiatric symptoms, her current life events and her homework. Patricia Rivas identified improvement in mood and attitude at home over the past few weeks. Patricia Rivas explored changes made, using MI OARS. Patricia Rivas reflected increased reliance on her support system, which includes her husband, her sponsor, and her medical team (therapist/psychiatrist). Patricia Rivas identified that her mental health needs to be tended to in a way that any physical health disorder would need, regular check ups, good health/hygiene, exercise, meds, etc. Patricia Rivas processed completion of Step 4 on her father. Patricia Rivas noted the importance of moving through that experience in order to make changes in her child's life and daily experiences at home. Patricia Rivas discussed concerns about weight, binge eating, and physical health. Jadi reports that she has reached out to [facility] about the gastric sleeve operation and will have an appointment in the next few weeks to find out more  information.    Plan: Return again in 2 weeks.   Diagnosis: Axis I: Generalized Anxiety Disorder and Bipolar II.      I discussed the assessment and treatment plan with the patient. The patient was provided an opportunity to ask questions and all were answered. The patient agreed with the plan and demonstrated an understanding of the instructions.   The patient was advised to call back or seek an in-person evaluation if the symptoms worsen or if the condition fails to improve as anticipated.  I provided 45 minutes of non-face-to-face time during this encounter.    R , LCSW  

## 2020-02-12 ENCOUNTER — Ambulatory Visit (HOSPITAL_COMMUNITY): Payer: 59 | Admitting: Licensed Clinical Social Worker

## 2020-02-19 ENCOUNTER — Other Ambulatory Visit: Payer: Self-pay | Admitting: Surgical Oncology

## 2020-02-19 DIAGNOSIS — K219 Gastro-esophageal reflux disease without esophagitis: Secondary | ICD-10-CM

## 2020-02-23 ENCOUNTER — Ambulatory Visit: Payer: Managed Care, Other (non HMO)

## 2020-02-26 ENCOUNTER — Encounter (HOSPITAL_COMMUNITY): Payer: Self-pay | Admitting: Licensed Clinical Social Worker

## 2020-02-26 ENCOUNTER — Telehealth (INDEPENDENT_AMBULATORY_CARE_PROVIDER_SITE_OTHER): Payer: 59 | Admitting: Licensed Clinical Social Worker

## 2020-02-26 ENCOUNTER — Other Ambulatory Visit: Payer: Self-pay

## 2020-02-26 DIAGNOSIS — F3181 Bipolar II disorder: Secondary | ICD-10-CM

## 2020-02-26 DIAGNOSIS — F411 Generalized anxiety disorder: Secondary | ICD-10-CM

## 2020-02-26 NOTE — Progress Notes (Signed)
Virtual Visit via Video Note  I connected with Patricia Rivas on 02/26/20 at  8:00 AM EDT by a video enabled telemedicine application and verified that I am speaking with the correct person using two identifiers.     I discussed the limitations of evaluation and management by telemedicine and the availability of in person appointments. The patient expressed understanding and agreed to proceed.  Type of Therapy: Individual Therapy   Treatment Goals addressed: "eat healthy, get control over emotional binge eating, quit smoking, and learn how to let my dad in". Patricia Rivas will report improvement in her mood, behaviors, and her relationships 5 out of 7 days.    Interventions:  CBT   Summary: Patricia Rivas is a 39 y.o. female who presents with Generalized Anxiety Disorder and Bipolar II.   Suicidal/Homicidal: No - without intent/plan   Therapist Response:   Patricia Rivas met with clinician for an individual session. Patricia Rivas discussed her psychiatric symptoms, her current life events and her homework. Patricia Rivas reports she has been feeling better since processing her relationship with dad. Clinician processed thoughts and feelings using CBT. Clinician provided psychoeducation about attachment and identified an insecure attachment between Patricia Rivas and dad throughout her life, which may be causing discomfort and mistrust now. Clinician explored differences in her relationship with mom, which is a much more secure relationship and has been consistently. Clinician explored Patricia Rivas's expectations of dad, now that she is an adult and reflected that dad may not understand how to be that person for her without some guidance and feedback.  Patricia Rivas processed weight loss plan and surgery. Clinician utilized CBT reality testing to process how things have really gotten with her weight and her eating habits. Clinician processed thoughts and feelings about the surgery and all of the therapy, nutrition education, and  meetings she will have with the surgeon before her operation in August.    Plan: Return again in 2 weeks.   Diagnosis: Axis I: Generalized Anxiety Disorder and Bipolar II.       I discussed the assessment and treatment plan with the patient. The patient was provided an opportunity to ask questions and all were answered. The patient agreed with the plan and demonstrated an understanding of the instructions.   The patient was advised to call back or seek an in-person evaluation if the symptoms worsen or if the condition fails to improve as anticipated.  I provided 60 minutes of non-face-to-face time during this encounter.   [name], LCSW

## 2020-03-02 ENCOUNTER — Other Ambulatory Visit: Payer: Managed Care, Other (non HMO)

## 2020-03-04 ENCOUNTER — Ambulatory Visit
Admission: RE | Admit: 2020-03-04 | Discharge: 2020-03-04 | Disposition: A | Discharge status: Home / Self Care | Payer: Managed Care, Other (non HMO) | Source: Ambulatory Visit | Attending: Surgical Oncology | Admitting: Surgical Oncology

## 2020-03-04 ENCOUNTER — Other Ambulatory Visit: Payer: Self-pay | Admitting: Surgical Oncology

## 2020-03-04 DIAGNOSIS — K219 Gastro-esophageal reflux disease without esophagitis: Secondary | ICD-10-CM

## 2020-03-11 ENCOUNTER — Other Ambulatory Visit: Payer: Self-pay

## 2020-03-11 ENCOUNTER — Ambulatory Visit (HOSPITAL_COMMUNITY): Payer: 59 | Admitting: Licensed Clinical Social Worker

## 2020-03-21 ENCOUNTER — Other Ambulatory Visit (HOSPITAL_COMMUNITY): Payer: Self-pay | Admitting: Psychiatry

## 2020-03-21 DIAGNOSIS — F411 Generalized anxiety disorder: Secondary | ICD-10-CM

## 2020-03-21 DIAGNOSIS — F3281 Premenstrual dysphoric disorder: Secondary | ICD-10-CM

## 2020-04-01 ENCOUNTER — Telehealth (INDEPENDENT_AMBULATORY_CARE_PROVIDER_SITE_OTHER): Payer: 59 | Admitting: Psychiatry

## 2020-04-01 ENCOUNTER — Other Ambulatory Visit: Payer: Self-pay

## 2020-04-01 DIAGNOSIS — F411 Generalized anxiety disorder: Secondary | ICD-10-CM | POA: Diagnosis not present

## 2020-04-01 DIAGNOSIS — F3281 Premenstrual dysphoric disorder: Secondary | ICD-10-CM

## 2020-04-01 MED ORDER — CITALOPRAM HYDROBROMIDE 10 MG PO TABS: 30.0000 mg | ORAL_TABLET | Freq: Every day | ORAL | 0 refills | Status: DC

## 2020-04-01 MED ORDER — GABAPENTIN 300 MG PO CAPS: ORAL_CAPSULE | ORAL | 0 refills | Status: DC

## 2020-04-01 MED ORDER — LAMOTRIGINE 100 MG PO TABS: ORAL_TABLET | ORAL | 0 refills | Status: DC

## 2020-04-01 NOTE — Progress Notes (Signed)
Virtual Visit via Telephone Note  I connected with Patricia Rivas on 04/01/20 at 11:30 AM EDT by telephone and verified that I am speaking with the correct person using two identifiers.  Location: Patient: home Provider: office   I discussed the limitations, risks, security and privacy concerns of performing an evaluation and management service by telephone and the availability of in person appointments. I also discussed with the patient that there may be a patient responsible charge related to this service. The patient expressed understanding and agreed to proceed.   History of Present Illness: "I am doing good". She is working on getting bariatric sleeve surgery in a couple of months. Patricia Rivas is meeting with the psychologist next week. Patricia Rivas is working with a nutrientionalst. She is exercising daily. Her sleep is good. Her depression seems to be stable, even around her period. She does get irritable when having her period.  Patricia Rivas can not recall the last time she was depressed. Overall her anxiety is well controlled. Patricia Rivas denies manic and hypomanic like symptoms. Her concentration is good and she is engaging in book reading again. She denies SI/HI.    Observations/Objective:  General Appearance: unable to assess  Eye Contact:  unable to assess  Speech:  Clear and Coherent and Normal Rate  Volume:  Normal  Mood:  Euthymic  Affect:  Full Range  Thought Process:  Goal Directed, Linear and Descriptions of Associations: Intact  Orientation:  Full (Time, Place, and Person)  Thought Content:  Logical  Suicidal Thoughts:  No  Homicidal Thoughts:  No  Memory:  Immediate;   Good  Judgement:  Good  Insight:  Good  Psychomotor Activity: unable to assess  Concentration:  Concentration: Good  Recall:  Good  Fund of Knowledge:  Good  Language:  Good  Akathisia:  unable to assess  Handed:  Right  AIMS (if indicated):     Assets:  Communication Skills Desire for  Improvement Financial Resources/Insurance Housing Intimacy Resilience Social Support Talents/Skills Transportation Vocational/Educational  ADL's:  unable to assess  Cognition:  WNL  Sleep:        I reviewed the information below on 04/01/2020 and have updated it Assessment and Plan:  Premenstrual dysphoric syndrome; GAD; alcohol dependence in remission; polysubstance abuse (opiates, marijuana, benzos, cocaine) in remission; history of ADHD; rule out bipolar 2 disorder   Continue: -Celexa 30 mg p.o. daily for PMDD and GAD -Lamictal 100 mg p.o. daily for PMDD -Neurontin 300 mg p.o. 4 times daily for off label treatment of GAD   Due to history of polysubstance abuse I will avoid treatment with scheduled medications when possible      Follow Up Instructions: In 3 months or sooner if needed   I discussed the assessment and treatment plan with the patient. The patient was provided an opportunity to ask questions and all were answered. The patient agreed with the plan and demonstrated an understanding of the instructions.   The patient was advised to call back or seek an in-person evaluation if the symptoms worsen or if the condition fails to improve as anticipated.  I provided 20 minutes of non-face-to-face time during this encounter.   [name], MD

## 2020-04-08 ENCOUNTER — Ambulatory Visit (INDEPENDENT_AMBULATORY_CARE_PROVIDER_SITE_OTHER): Payer: 59 | Admitting: Licensed Clinical Social Worker

## 2020-04-08 ENCOUNTER — Other Ambulatory Visit: Payer: Self-pay

## 2020-04-08 ENCOUNTER — Encounter (HOSPITAL_COMMUNITY): Payer: Self-pay | Admitting: Licensed Clinical Social Worker

## 2020-04-08 DIAGNOSIS — F3181 Bipolar II disorder: Secondary | ICD-10-CM | POA: Diagnosis not present

## 2020-04-08 DIAGNOSIS — F411 Generalized anxiety disorder: Secondary | ICD-10-CM

## 2020-04-08 NOTE — Progress Notes (Signed)
Virtual Visit via Video Note  I connected with Patricia Rivas on 04/08/20 at 10:00 AM EDT by a video enabled telemedicine application and verified that I am speaking with the correct person using two identifiers.  Location: Patient: home Provider: office   I discussed the limitations of evaluation and management by telemedicine and the availability of in person appointments. The patient expressed understanding and agreed to proceed.   Type of Therapy: Individual Therapy   Treatment Goals addressed: "eat healthy, get control over emotional binge eating, quit smoking, and learn how to let my dad in". Patricia Rivas will report improvement in her mood, behaviors, and her relationships 5 out of 7 days.    Interventions:  CBT   Summary: Patricia Rivas is a 39 y.o. female who presents with Generalized Anxiety Disorder and Bipolar II.   Suicidal/Homicidal: No - without intent/plan   Therapist Response:   Patricia Rivas met with clinician for an individual session. Patricia Rivas discussed her psychiatric symptoms, her current life events and her homework. Patricia Rivas reports feeling increasingly stressed, angry, and irritable this week. Clinician utilized CBT to process through triggers, as well as thoughts, feelings, and behaviors. Clinician identified both internal and external factors leading to current mood and processed why these seemingly "normal" issues are becoming so upsetting. Clinician explored thoughts and feelings about upcoming return to the office in the fall. Clinician noted the change in the routine, which required serious adjustment, as well as the transition back into "normal life", which will require another period of adjustment. Clinician processed through coping skills and encouraged self compassion as a part of her coping.    Plan: Return again in 2 weeks.   Diagnosis: Axis I: Generalized Anxiety Disorder and Bipolar II.    I discussed the assessment and treatment plan with the patient. The  patient was provided an opportunity to ask questions and all were answered. The patient agreed with the plan and demonstrated an understanding of the instructions.   The patient was advised to call back or seek an in-person evaluation if the symptoms worsen or if the condition fails to improve as anticipated.  I provided 45 minutes of non-face-to-face time during this encounter.   [name], LCSW

## 2020-04-27 ENCOUNTER — Other Ambulatory Visit: Payer: Self-pay

## 2020-04-27 ENCOUNTER — Ambulatory Visit (HOSPITAL_COMMUNITY): Payer: 59 | Admitting: Licensed Clinical Social Worker

## 2020-05-22 IMAGING — RF DG UGI W SINGLE CM
1 series · 12 of 12 positions shown · non-contrast
Comparison: None.

CLINICAL DATA: Preop bariatric surgery

EXAM:
UPPER GI SERIES WITH KUB
TECHNIQUE: After obtaining a scout radiograph a routine upper GI series was
performed using thin barium
FLUOROSCOPY TIME:  Fluoroscopy Time:  1 minutes 12 seconds
Radiation Exposure Index (if provided by the fluoroscopic device):
408 mGy
Number of Acquired Spot Images: 0

[Series 1: one shot · 0.14mm/px · 12 of 12 slices shown]
[im 1/12]
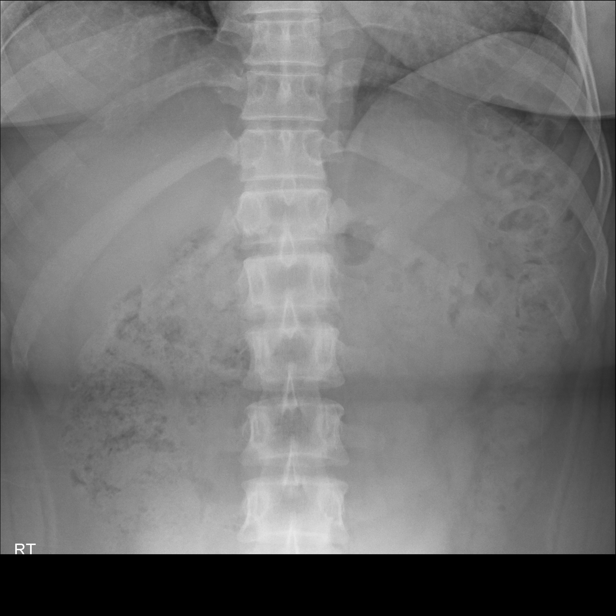
[im 2/12]
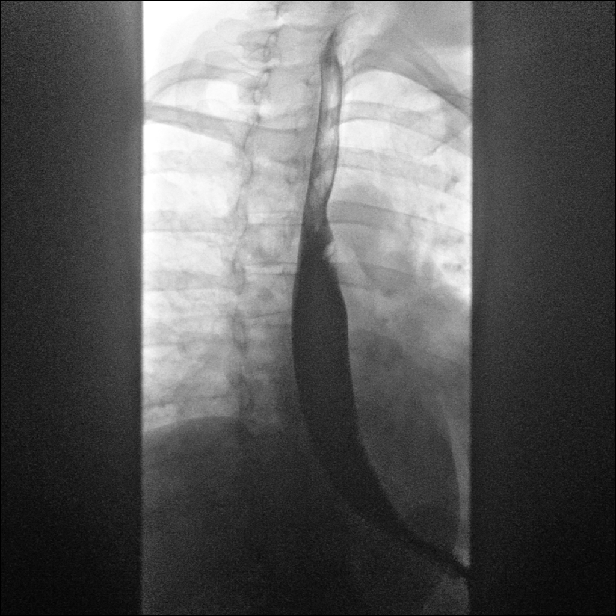
[im 3/12]
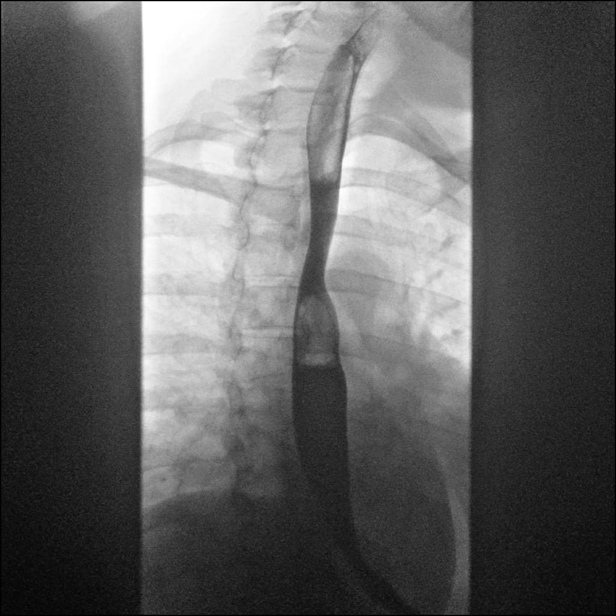
[im 4/12]
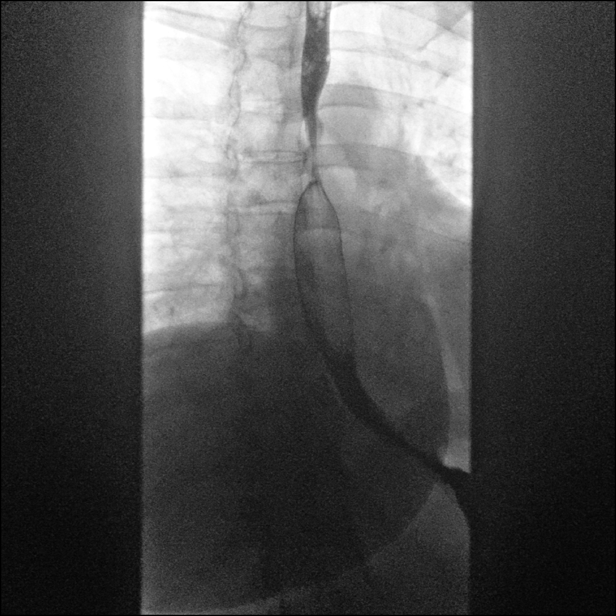
[im 5/12]
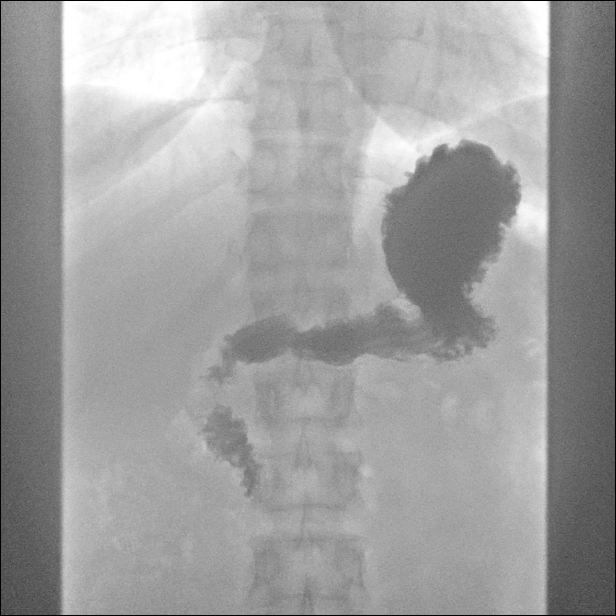
[im 6/12]
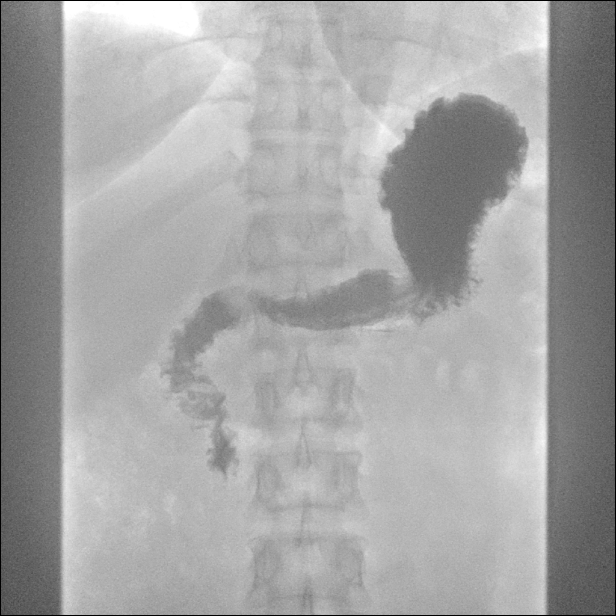
[im 7/12]
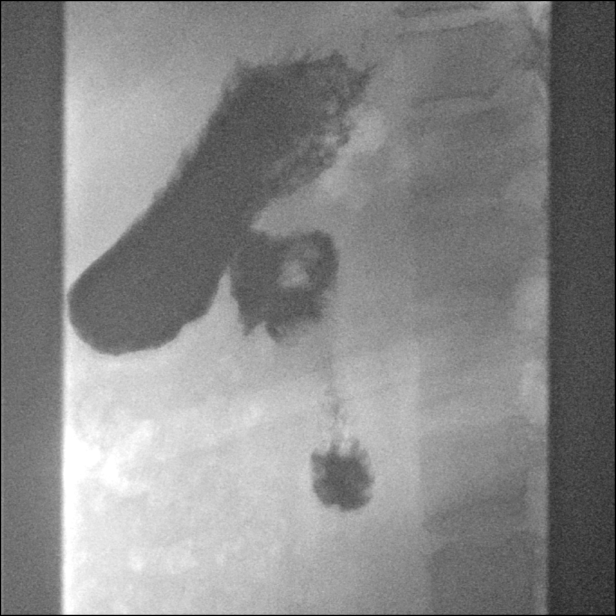
[im 8/12]
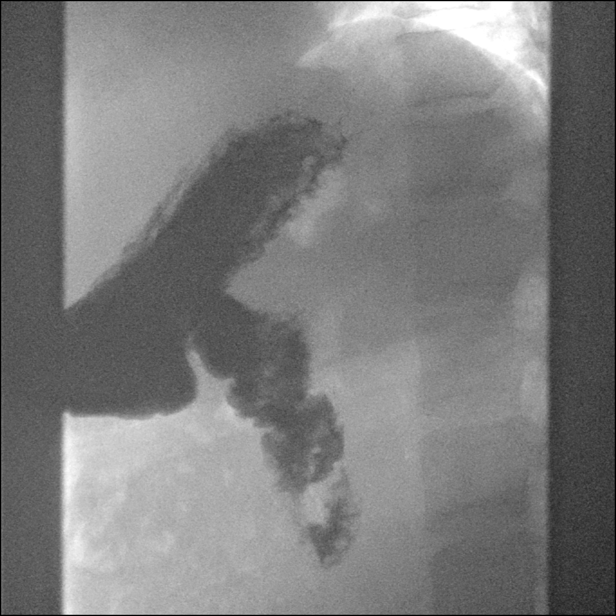
[im 9/12]
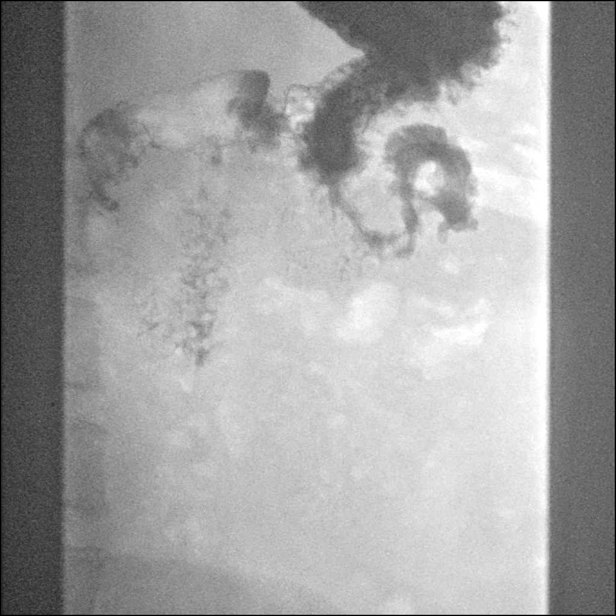
[im 10/12]
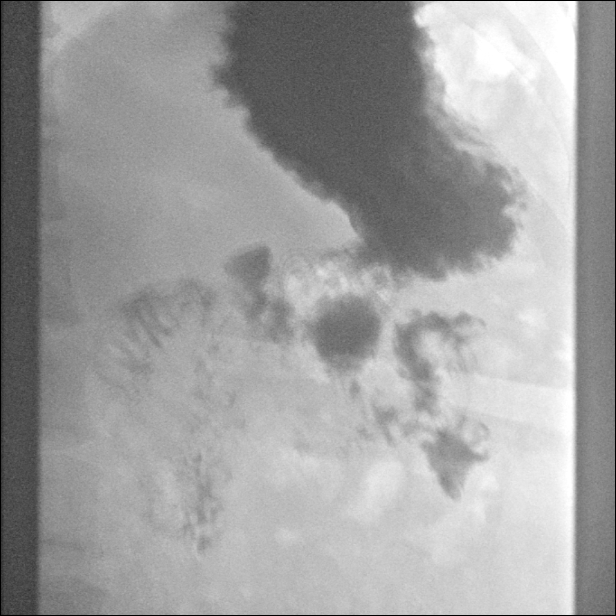
[im 11/12]
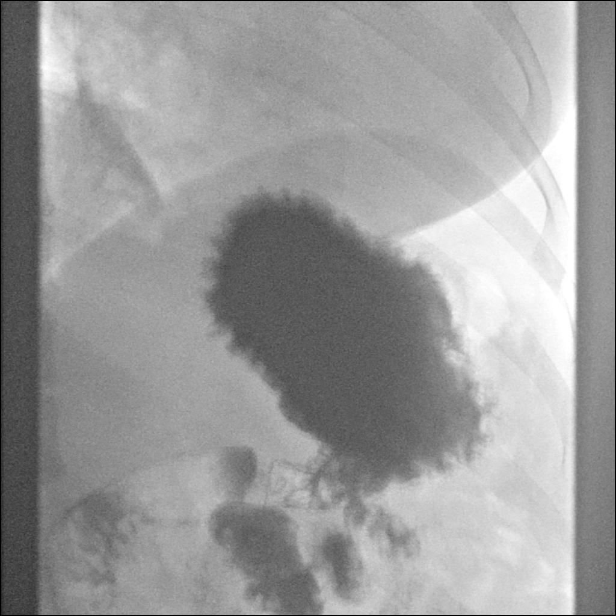
[im 12/12]
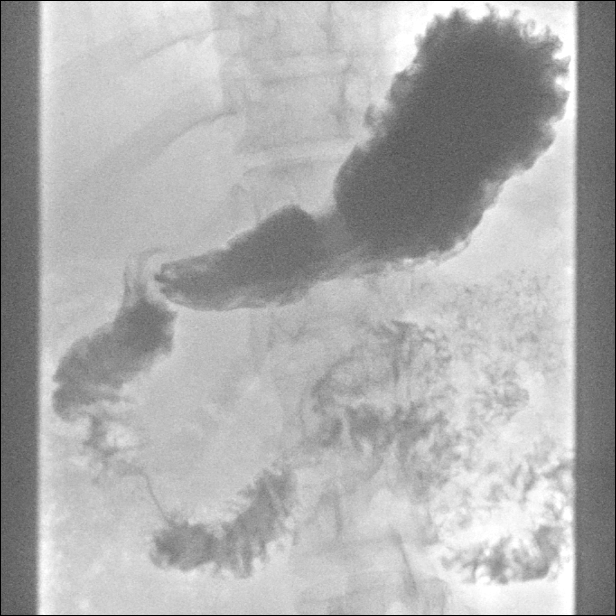

[12 of 12 positions shown; findings below may reference images not displayed]

FINDINGS: On the scout radiograph a moderate amount of retained stool is noted
within the colon. The esophagus has a normal course and caliber.
There is no stricture or mass. The stomach, duodenal bulb and
duodenal C loop are unremarkable. No hiatal hernia identified. No
gastroesophageal reflux.
IMPRESSION: 1. Normal exam.

## 2020-06-14 HISTORY — PX: LAPAROSCOPIC GASTRIC SLEEVE RESECTION: SHX5895

## 2020-07-01 ENCOUNTER — Telehealth (HOSPITAL_COMMUNITY): Payer: 59 | Admitting: Psychiatry

## 2020-07-01 ENCOUNTER — Other Ambulatory Visit: Payer: Self-pay

## 2020-07-01 ENCOUNTER — Encounter (HOSPITAL_COMMUNITY): Payer: Self-pay | Admitting: Psychiatry

## 2020-07-01 DIAGNOSIS — F411 Generalized anxiety disorder: Secondary | ICD-10-CM

## 2020-07-01 DIAGNOSIS — F3281 Premenstrual dysphoric disorder: Secondary | ICD-10-CM

## 2020-07-01 MED ORDER — CITALOPRAM HYDROBROMIDE 10 MG PO TABS: 30.0000 mg | ORAL_TABLET | Freq: Every day | ORAL | 0 refills | Status: DC

## 2020-07-01 MED ORDER — LAMOTRIGINE 100 MG PO TABS: ORAL_TABLET | ORAL | 0 refills | Status: DC

## 2020-07-01 MED ORDER — GABAPENTIN 300 MG PO CAPS: ORAL_CAPSULE | ORAL | 0 refills | Status: DC

## 2020-07-01 NOTE — Progress Notes (Unsigned)
Virtual Visit via Video Note  I connected with Ayra L Dillenbeck on 07/01/20 at 10:00 AM EDT by a video enabled telemedicine application and verified that I am speaking with the correct person using two identifiers.  Location: Patient: *** Provider: ***   I discussed the limitations of evaluation and management by telemedicine and the availability of in person appointments. The patient expressed understanding and agreed to proceed.  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 she has lost about 30 lbs so far and is very happy. It has been a big change and she is still adjusting. She is out of work until the end of the month.  Depression stable but has some random days where she felt down. She thinks it may be due to being alone during the day. Her anxiety is overall well controlled. She had one in the hospitalized after surgery and another when she missed a dose of the Neurontin. Her sleep is good and she has given up caffeine. She denies SI/HI.   Observations/Objective: Psychiatric Specialty Exam: ROS  unknown if currently breastfeeding.There is no height or weight on file to calculate BMI.  General Appearance: {Appearance:22683}  Eye Contact:  {BHH EYE CONTACT:22684}  Speech:  {Speech:22685}  Volume:  {Volume (PAA):22686}  Mood:  {BHH MOOD:22306}  Affect:  {Affect (PAA):22687}  Thought Process:  {Thought Process (PAA):22688}  Orientation:  {BHH ORIENTATION (PAA):22689}  Thought Content:  {Thought Content:22690}  Suicidal Thoughts:  {ST/HT (PAA):22692}  Homicidal Thoughts:  {ST/HT (PAA):22692}  Memory:  {BHH MEMORY:22881}  Judgement:  {Judgement (PAA):22694}  Insight:  {Insight (PAA):22695}  Psychomotor Activity:  {Psychomotor (PAA):22696}  Concentration:  {Concentration:21399}  Recall:  {BHH GOOD/FAIR/POOR:22877}  Fund of Knowledge:  {BHH GOOD/FAIR/POOR:22877}  Language:  {BHH GOOD/FAIR/POOR:22877}  Akathisia:  {BHH YES OR NO:22294}  Handed:   {Handed:22697}  AIMS (if indicated):     Assets:  {Assets (PAA):22698}  ADL's:  {BHH VZD'G:38756}  Cognition:  {chl bhh cognition:304700322}  Sleep:        Assessment and Plan: ***      Follow Up Instructions: In 3 months or sooner if needed   I discussed the assessment and treatment plan with the patient. The patient was provided an opportunity to ask questions and all were answered. The patient agreed with the plan and demonstrated an understanding of the instructions.   The patient was advised to call back or seek an in-person evaluation if the symptoms worsen or if the condition fails to improve as anticipated.  I provided *** minutes of non-face-to-face time during this encounter.   [name], MD

## 2020-09-23 ENCOUNTER — Telehealth (HOSPITAL_COMMUNITY): Payer: 59 | Admitting: Psychiatry

## 2020-09-23 ENCOUNTER — Other Ambulatory Visit: Payer: Self-pay

## 2020-09-23 DIAGNOSIS — F411 Generalized anxiety disorder: Secondary | ICD-10-CM

## 2020-09-23 DIAGNOSIS — F3281 Premenstrual dysphoric disorder: Secondary | ICD-10-CM

## 2020-09-23 NOTE — Progress Notes (Unsigned)
Doing well. Got flu shot and COVID booster 2 days ago and very tired since  Mood is stable. PMS around period and now on sprinter birth control. Active and lost 55 lbs which helps allot physically and emotionally.  No manic symptoms. No seasonal mood symptoms  Mild anxiety about weight gain  No panic attacks  Good sleep  No concerns at this. Doing great and feeling more like herself.  Will restart therapy   Refill meds 3/3 @ 11.30a 10 min

## 2020-09-28 MED ORDER — CITALOPRAM HYDROBROMIDE 10 MG PO TABS: 30.0000 mg | ORAL_TABLET | Freq: Every day | ORAL | 0 refills | Status: DC

## 2020-09-28 MED ORDER — GABAPENTIN 300 MG PO CAPS: ORAL_CAPSULE | ORAL | 0 refills | Status: DC

## 2020-09-28 MED ORDER — LAMOTRIGINE 100 MG PO TABS: ORAL_TABLET | ORAL | 0 refills | Status: DC

## 2020-09-30 ENCOUNTER — Telehealth (HOSPITAL_COMMUNITY): Payer: 59 | Admitting: Psychiatry

## 2020-10-13 ENCOUNTER — Ambulatory Visit (INDEPENDENT_AMBULATORY_CARE_PROVIDER_SITE_OTHER): Payer: 59 | Admitting: Licensed Clinical Social Worker

## 2020-10-13 ENCOUNTER — Other Ambulatory Visit: Payer: Self-pay

## 2020-10-13 DIAGNOSIS — F3181 Bipolar II disorder: Secondary | ICD-10-CM | POA: Diagnosis not present

## 2020-10-13 DIAGNOSIS — F411 Generalized anxiety disorder: Secondary | ICD-10-CM

## 2020-10-15 ENCOUNTER — Encounter (HOSPITAL_COMMUNITY): Payer: Self-pay | Admitting: Licensed Clinical Social Worker

## 2020-10-15 NOTE — Progress Notes (Signed)
Virtual Visit via Video Note  I connected with Patricia Rivas on 10/15/20 at  9:00 AM EST by a video enabled telemedicine application and verified that I am speaking with the correct person using two identifiers.  Location: Patient: home Provider: home office   I discussed the limitations of evaluation and management by telemedicine and the availability of in person appointments. The patient expressed understanding and agreed to proceed.   Type of Therapy: Individual Therapy   Treatment Goals addressed: "eat healthy, get control over emotional binge eating, quit smoking, and learn how to let my dad in". Patricia Rivas will report improvement in her mood, behaviors, and her relationships 5 out of 7 days.    Interventions:  CBT   Summary: Patricia Rivas is a 39 y.o. female who presents with Generalized Anxiety Disorder and Bipolar II.   Suicidal/Homicidal: No - without intent/plan   Therapist Response:   Nature met with clinician for an individual session. Patricia Rivas discussed her psychiatric symptoms, her current life events and her homework. Patricia Rivas reports she has been doing really well. She had gastric bypass surgery and now, after 4 months, is down 70 lbs. She reports her body feels better, her attitude is better, and her life overall is improved. Clinician explored current status in treatment goals and noted that she has been doing better with her emotional eating, she has quit smoking, and her relationship with dad seems to be on target. Clinician processed some typical irritation with dad and reassured her that this was normal. Clinician discussed recent return of ex-boyfriend into town and some recent interactions. Clinician provided feedback and explored Patricia Rivas's thoughts and feelings about this, using CBT. Clinician reflected the importance of using open communication with husband, as well as maintaining appropriate boundaries with ex-boyfriend.    Plan: Return again in 4-6 weeks.    Diagnosis: Axis I: Generalized Anxiety Disorder and Bipolar II.           I discussed the assessment and treatment plan with the patient. The patient was provided an opportunity to ask questions and all were answered. The patient agreed with the plan and demonstrated an understanding of the instructions.   The patient was advised to call back or seek an in-person evaluation if the symptoms worsen or if the condition fails to improve as anticipated.  I provided 55 minutes of non-face-to-face time during this encounter.   [name], LCSW

## 2020-11-23 ENCOUNTER — Encounter (HOSPITAL_COMMUNITY): Payer: Self-pay | Admitting: Licensed Clinical Social Worker

## 2020-11-23 ENCOUNTER — Other Ambulatory Visit: Payer: Self-pay

## 2020-11-23 ENCOUNTER — Ambulatory Visit (INDEPENDENT_AMBULATORY_CARE_PROVIDER_SITE_OTHER): Payer: 59 | Admitting: Licensed Clinical Social Worker

## 2020-11-23 DIAGNOSIS — F411 Generalized anxiety disorder: Secondary | ICD-10-CM

## 2020-11-23 DIAGNOSIS — F3181 Bipolar II disorder: Secondary | ICD-10-CM

## 2020-11-23 NOTE — Progress Notes (Signed)
Virtual Visit via Video Note  I connected with Patricia Rivas on 11/23/20 at  9:00 AM EST by a video enabled telemedicine application and verified that I am speaking with the correct person using two identifiers.  Location: Patient: home Provider: home office   I discussed the limitations of evaluation and management by telemedicine and the availability of in person appointments. The patient expressed understanding and agreed to proceed.  Type of Therapy: Individual Therapy   Treatment Goals addressed: "eat healthy, get control over emotional binge eating, quit smoking, and learn how to let my dad in". Patricia Rivas will report improvement in her mood, behaviors, and her relationships 5 out of 7 days.    Interventions:  CBT   Summary: Patricia Rivas is a 40 y.o. female who presents with Generalized Anxiety Disorder and Bipolar II.   Suicidal/Homicidal: No - without intent/plan   Therapist Response:   Patricia Rivas met with clinician for an individual session. Patricia Rivas discussed her psychiatric symptoms, her current life events and her homework. Patricia Rivas reports she has been doing really well. She is noticing a grief period over her past eating habits. Clinician utilized CBT to process thoughts, feelings, and behaviors associated with her eating. Clinician reflected that this makes sense, as her lifestyle has been forced to change, she has immediate consequences if she overeats or eats the wrong thing. Clinician also identified that this new body requires a new instruction manual. Clinician discussed the emotional perception of herself vs the actual or physical perception. Clinician identified the importance of maintaining a healthy attitude about food, health, and eating will be important. Clinician noted the improvement in self esteem, as well as attitude since daily exercise has been incorporated into her routine.     Plan: Return again in 4-6 weeks.   Diagnosis: Axis I: Generalized Anxiety  Disorder and Bipolar II.     I discussed the assessment and treatment plan with the patient. The patient was provided an opportunity to ask questions and all were answered. The patient agreed with the plan and demonstrated an understanding of the instructions.   The patient was advised to call back or seek an in-person evaluation if the symptoms worsen or if the condition fails to improve as anticipated.  I provided 55 minutes of non-face-to-face time during this encounter.   [name], LCSW

## 2020-12-19 ENCOUNTER — Other Ambulatory Visit (HOSPITAL_COMMUNITY): Payer: Self-pay | Admitting: Psychiatry

## 2020-12-19 DIAGNOSIS — F3281 Premenstrual dysphoric disorder: Secondary | ICD-10-CM

## 2020-12-19 DIAGNOSIS — F411 Generalized anxiety disorder: Secondary | ICD-10-CM

## 2020-12-21 ENCOUNTER — Ambulatory Visit (HOSPITAL_COMMUNITY): Payer: 59 | Admitting: Licensed Clinical Social Worker

## 2020-12-23 ENCOUNTER — Other Ambulatory Visit: Payer: Self-pay

## 2020-12-23 ENCOUNTER — Telehealth (INDEPENDENT_AMBULATORY_CARE_PROVIDER_SITE_OTHER): Payer: 59 | Admitting: Psychiatry

## 2020-12-23 DIAGNOSIS — F3181 Bipolar II disorder: Secondary | ICD-10-CM | POA: Diagnosis not present

## 2020-12-23 DIAGNOSIS — F411 Generalized anxiety disorder: Secondary | ICD-10-CM

## 2020-12-23 DIAGNOSIS — F3281 Premenstrual dysphoric disorder: Secondary | ICD-10-CM | POA: Diagnosis not present

## 2020-12-23 MED ORDER — GABAPENTIN 300 MG PO CAPS: ORAL_CAPSULE | ORAL | 0 refills | Status: DC

## 2020-12-23 MED ORDER — LAMOTRIGINE 100 MG PO TABS: ORAL_TABLET | ORAL | 0 refills | Status: DC

## 2020-12-23 MED ORDER — CITALOPRAM HYDROBROMIDE 10 MG PO TABS: 30.0000 mg | ORAL_TABLET | Freq: Every day | ORAL | 0 refills | Status: DC

## 2020-12-23 NOTE — Progress Notes (Signed)
Virtual Visit via Video Note  I connected with Patricia Rivas on 12/23/20 at 11:30 AM EST by a video enabled telemedicine application and verified that I am speaking with the correct person using two identifiers.  Location: Patient: home Provider: office   I discussed the limitations of evaluation and management by telemedicine and the availability of in person appointments. The patient expressed understanding and agreed to proceed.  History of Present Illness: Patricia Rivas reports she is doing well. She and her husband are starting marital therapy. It is not due to any serious issues and they have engaged in it before. It was beneficial. She notes that her libido is low and she is going to discuss it with their therapist. She is denying depression symptoms. Patricia Rivas denies manic and hypomanic like episodes. Her sleep and appetite are good. She is working out and is happy with her weight loss efforts and results. Her anxiety is mild and manageable. Patricia Rivas denies SI/HI. She remains abstinent from all drugs and alcohol.    Observations/Objective: Psychiatric Specialty Exam: ROS  unknown if currently breastfeeding.There is no height or weight on file to calculate BMI.  General Appearance: Casual and Neat  Eye Contact:  Good  Speech:  Clear and Coherent and Normal Rate  Volume:  Normal  Mood:  Euthymic  Affect:  Full Range  Thought Process:  Goal Directed, Linear and Descriptions of Associations: Intact  Orientation:  Full (Time, Place, and Person)  Thought Content:  Logical  Suicidal Thoughts:  No  Homicidal Thoughts:  No  Memory:  Immediate;   Good  Judgement:  Good  Insight:  Good  Psychomotor Activity:  Normal  Concentration:  Concentration: Good  Recall:  Good  Fund of Knowledge:  Good  Language:  Good  Akathisia:  No  Handed:  Right  AIMS (if indicated):     Assets:  Communication Skills Desire for Improvement Financial Resources/Insurance Housing Intimacy Leisure  Time Physical Health Resilience Social Support Talents/Skills Transportation Vocational/Educational  ADL's:  Intact  Cognition:  WNL  Sleep:        Assessment and Plan: 1. GAD (generalized anxiety disorder) - citalopram (CELEXA) 10 MG tablet; Take 3 tablets (30 mg total) by mouth daily.  Dispense: 270 tablet; Refill: 0 - gabapentin (NEURONTIN) 300 MG capsule; TAKE 1 CAPSULE BY MOUTH 4 TIMES DAILY  Dispense: 360 capsule; Refill: 0  2. PMDD (premenstrual dysphoric disorder) - citalopram (CELEXA) 10 MG tablet; Take 3 tablets (30 mg total) by mouth daily.  Dispense: 270 tablet; Refill: 0 - lamoTRIgine (LAMICTAL) 100 MG tablet; TAKE 1 TABLET BY MOUTH EVERY DAY  Dispense: 90 tablet; Refill: 0  3. Bipolar II disorder ([name]) - lamoTRIgine (LAMICTAL) 100 MG tablet; TAKE 1 TABLET BY MOUTH EVERY DAY  Dispense: 90 tablet; Refill: 0  -discussed intimacy and sexual desires in relationship. Discussed options for medication treatment in regards to it.  PHQ2- 0 C-SSRS- 0    Follow Up Instructions: In 3 months or sooner if needed   I discussed the assessment and treatment plan with the patient. The patient was provided an opportunity to ask questions and all were answered. The patient agreed with the plan and demonstrated an understanding of the instructions.   The patient was advised to call back or seek an in-person evaluation if the symptoms worsen or if the condition fails to improve as anticipated.    [name], MD

## 2020-12-29 ENCOUNTER — Other Ambulatory Visit: Payer: Self-pay | Admitting: Obstetrics and Gynecology

## 2020-12-29 DIAGNOSIS — Z1231 Encounter for screening mammogram for malignant neoplasm of breast: Secondary | ICD-10-CM

## 2021-01-31 ENCOUNTER — Other Ambulatory Visit (HOSPITAL_COMMUNITY): Payer: Self-pay | Admitting: Psychiatry

## 2021-01-31 DIAGNOSIS — F411 Generalized anxiety disorder: Secondary | ICD-10-CM

## 2021-02-23 ENCOUNTER — Ambulatory Visit
Admission: RE | Admit: 2021-02-23 | Discharge: 2021-02-23 | Disposition: A | Discharge status: Home / Self Care | Payer: Managed Care, Other (non HMO) | Source: Ambulatory Visit | Attending: Obstetrics and Gynecology | Admitting: Obstetrics and Gynecology

## 2021-02-23 ENCOUNTER — Other Ambulatory Visit: Payer: Self-pay

## 2021-02-23 DIAGNOSIS — Z1231 Encounter for screening mammogram for malignant neoplasm of breast: Secondary | ICD-10-CM

## 2021-04-07 ENCOUNTER — Telehealth (HOSPITAL_COMMUNITY): Payer: 59 | Admitting: Psychiatry

## 2021-04-07 ENCOUNTER — Other Ambulatory Visit: Payer: Self-pay

## 2021-04-07 ENCOUNTER — Telehealth (HOSPITAL_COMMUNITY): Payer: Self-pay | Admitting: Psychiatry

## 2021-04-07 ENCOUNTER — Ambulatory Visit (HOSPITAL_COMMUNITY): Payer: 59 | Admitting: Psychiatry

## 2021-04-07 NOTE — Telephone Encounter (Signed)
Patient called the clinic this morning to cancel today's schedule appointment.

## 2021-04-20 ENCOUNTER — Telehealth (HOSPITAL_COMMUNITY): Payer: Self-pay | Admitting: *Deleted

## 2021-04-20 ENCOUNTER — Other Ambulatory Visit (HOSPITAL_COMMUNITY): Payer: Self-pay | Admitting: *Deleted

## 2021-04-20 DIAGNOSIS — F411 Generalized anxiety disorder: Secondary | ICD-10-CM

## 2021-04-20 MED ORDER — GABAPENTIN 300 MG PO CAPS: ORAL_CAPSULE | ORAL | 0 refills | Status: DC

## 2021-04-20 NOTE — Telephone Encounter (Signed)
Pt called requesting refill of Neurontin just until her upcoming appointment on 04/28/21. Please review.

## 2021-04-28 ENCOUNTER — Other Ambulatory Visit (HOSPITAL_COMMUNITY): Payer: Self-pay | Admitting: Psychiatry

## 2021-04-28 ENCOUNTER — Telehealth (INDEPENDENT_AMBULATORY_CARE_PROVIDER_SITE_OTHER): Payer: 59 | Admitting: Psychiatry

## 2021-04-28 ENCOUNTER — Other Ambulatory Visit: Payer: Self-pay

## 2021-04-28 DIAGNOSIS — F3281 Premenstrual dysphoric disorder: Secondary | ICD-10-CM

## 2021-04-28 DIAGNOSIS — F411 Generalized anxiety disorder: Secondary | ICD-10-CM

## 2021-04-28 DIAGNOSIS — F3181 Bipolar II disorder: Secondary | ICD-10-CM | POA: Diagnosis not present

## 2021-04-28 MED ORDER — CITALOPRAM HYDROBROMIDE 10 MG PO TABS: 30.0000 mg | ORAL_TABLET | Freq: Every day | ORAL | 0 refills | Status: DC

## 2021-04-28 MED ORDER — GABAPENTIN 300 MG PO CAPS: 300.0000 mg | ORAL_CAPSULE | Freq: Four times a day (QID) | ORAL | 0 refills | Status: DC

## 2021-04-28 MED ORDER — LAMOTRIGINE 100 MG PO TABS: ORAL_TABLET | ORAL | 0 refills | Status: DC

## 2021-04-28 NOTE — Progress Notes (Signed)
Virtual Visit via Video Note  I connected with Patricia Rivas on 04/28/21 at  8:30 AM EDT by a video enabled telemedicine application and verified that I am speaking with the correct person using two identifiers.  [place]   I discussed the limitations of evaluation and management by telemedicine and the availability of in person appointments. The patient expressed understanding and agreed to proceed.  History of Present Illness: Patricia Rivas has been visiting her family for the last one week. Her mood is stable. Patricia Rivas is thinking of starting medical menopause due to PMDD. She is irritable and cranky. Her mood is labile. She energy is generally very low but she will experience random spikes in energy levels. Her food cravings go up. The whole experience lasts for 2 weeks. Patricia Rivas has tried different oral contraceptives but nothing has been effective. Patricia Rivas denies depression, anhedonia and isolation. Her sleep and appetite are good. Pt denies recent manic and hypomanic symptoms including periods of decreased need for sleep, increased energy, mood lability, impulsivity, FOI, and excessive spending. She denies SI/HI. Her anxiety is mild and manageable.     Observations/Objective: Psychiatric Specialty Exam: ROS  unknown if currently breastfeeding.There is no height or weight on file to calculate BMI.  General Appearance: Casual and Neat  Eye Contact:  Good  Speech:  Clear and Coherent and Normal Rate  Volume:  Normal  Mood:  Euthymic  Affect:  Full Range  Thought Process:  Goal Directed, Linear, and Descriptions of Associations: Intact  Orientation:  Full (Time, Place, and Person)  Thought Content:  Logical  Suicidal Thoughts:  No  Homicidal Thoughts:  No  Memory:  Immediate;   Good  Judgement:  Good  Insight:  Good  Psychomotor Activity:  Normal  Concentration:  Concentration: Good  Recall:  Good  Fund of Knowledge:  Good   Language:  Good  Akathisia:  No  Handed:  Right  AIMS (if indicated):     Assets:  Communication Skills Desire for Improvement Financial Resources/Insurance Housing Intimacy Leisure Time Resilience Social Support Talents/Skills Transportation Vocational/Educational  ADL's:  Intact  Cognition:  WNL  Sleep:        Assessment and Plan: Depression screen [facility] 2/9 04/28/2021 12/23/2020  Decreased Interest 0 0  Down, Depressed, Hopeless 0 0  PHQ - 2 Score 0 0    Flowsheet Row Video Visit [facility] 04/28/2021 in [facility] Video Visit from 12/23/2020 in [facility]  C-SSRS RISK CATEGORY No Risk No Risk       - [name] is going to speak with her gynologist this month to talk about medical menopause  1. GAD (generalized anxiety disorder) - citalopram (CELEXA) 10 MG tablet; Take 3 tablets (30 mg total) by mouth daily.  Dispense: 270 tablet; Refill: 0 - gabapentin (NEURONTIN) 300 MG capsule; Take 1 capsule (300 mg total) by mouth 4 (four) times daily.  Dispense: 360 capsule; Refill: 0  2. PMDD (premenstrual dysphoric disorder) - citalopram (CELEXA) 10 MG tablet; Take 3 tablets (30 mg total) by mouth daily.  Dispense: 270 tablet; Refill: 0 - lamoTRIgine (LAMICTAL) 100 MG tablet; TAKE 1 TABLET BY MOUTH EVERY DAY  Dispense: 90 tablet; Refill: 0  3. Bipolar II disorder ([name]) - lamoTRIgine (LAMICTAL) 100 MG tablet; TAKE 1 TABLET BY MOUTH EVERY DAY  Dispense: 90 tablet; Refill: 0    Follow Up Instructions: In 2-3 months or sooner if needed  I discussed the assessment and treatment plan with the patient. The patient was provided an opportunity to ask questions and all were answered. The patient agreed with the plan and demonstrated an understanding of the instructions.   The patient was advised to call back or seek an in-person evaluation if the symptoms worsen or if the condition fails to improve as  anticipated.  I provided 15 minutes of non-face-to-face time during this encounter.   [name], MD

## 2021-06-30 ENCOUNTER — Other Ambulatory Visit: Payer: Self-pay

## 2021-06-30 ENCOUNTER — Telehealth (INDEPENDENT_AMBULATORY_CARE_PROVIDER_SITE_OTHER): Payer: 59 | Admitting: Psychiatry

## 2021-06-30 DIAGNOSIS — F3281 Premenstrual dysphoric disorder: Secondary | ICD-10-CM

## 2021-06-30 DIAGNOSIS — F411 Generalized anxiety disorder: Secondary | ICD-10-CM

## 2021-06-30 DIAGNOSIS — F3181 Bipolar II disorder: Secondary | ICD-10-CM | POA: Diagnosis not present

## 2021-06-30 MED ORDER — LAMOTRIGINE 100 MG PO TABS: ORAL_TABLET | ORAL | 0 refills | Status: DC

## 2021-06-30 MED ORDER — GABAPENTIN 300 MG PO CAPS: 300.0000 mg | ORAL_CAPSULE | Freq: Four times a day (QID) | ORAL | 0 refills | Status: DC

## 2021-06-30 MED ORDER — CITALOPRAM HYDROBROMIDE 10 MG PO TABS: 30.0000 mg | ORAL_TABLET | Freq: Every day | ORAL | 0 refills | Status: DC

## 2021-06-30 NOTE — Progress Notes (Signed)
Virtual Visit via Video Note  I connected with Patricia Rivas on 06/30/21 at 10:30 AM EDT by a video enabled telemedicine application and verified that I am speaking with the correct person using two identifiers.  Location: Patient: home Provider: office   I discussed the limitations of evaluation and management by telemedicine and the availability of in person appointments. The patient expressed understanding and agreed to proceed.  History of Present Illness: Patricia Rivas did speak with her gynecologist about doing a medically induced menopause. She was not on board with the idea and instead started on a hormonal therapy for PMDD. It has been about 2 months and she is feeling much better. Her mood is mostly stable and she no longer swinging. Her energy is stable. She is having some PMS around the time of her period and no longer feels out of control. Patricia Rivas is sleeping well. She rarely naps. Pt denies recent manic and hypomanic symptoms including periods of decreased need for sleep, increased energy, mood lability, impulsivity, FOI, and excessive spending.Patricia Rivas denies SI/HI. Her anxiety is more situational and is manageable.    Observations/Objective: Psychiatric Specialty Exam: ROS  unknown if currently breastfeeding.There is no height or weight on file to calculate BMI.  General Appearance: Casual and Neat  Eye Contact:  Good  Speech:  Clear and Coherent and Normal Rate  Volume:  Normal  Mood:  Euthymic  Affect:  Full Range  Thought Process:  Goal Directed, Linear, and Descriptions of Associations: Intact  Orientation:  Full (Time, Place, and Person)  Thought Content:  Logical  Suicidal Thoughts:  No  Homicidal Thoughts:  No  Memory:  Immediate;   Good  Judgement:  Good  Insight:  Good  Psychomotor Activity:  Normal  Concentration:  Concentration: Good  Recall:  Good  Fund of Knowledge:  Good  Language:  Good  Akathisia:  No  Handed:  Right  AIMS (if indicated):      Assets:  Communication Skills Desire for Improvement Financial Resources/Insurance Housing Intimacy Leisure Time Resilience Social Support Talents/Skills Transportation Vocational/Educational  ADL's:  Intact  Cognition:  WNL  Sleep:        Assessment and Plan: 1. GAD (generalized anxiety disorder) - citalopram (CELEXA) 10 MG tablet; Take 3 tablets (30 mg total) by mouth daily.  Dispense: 270 tablet; Refill: 0 - gabapentin (NEURONTIN) 300 MG capsule; Take 1 capsule (300 mg total) by mouth 4 (four) times daily.  Dispense: 360 capsule; Refill: 0  2. PMDD (premenstrual dysphoric disorder) - citalopram (CELEXA) 10 MG tablet; Take 3 tablets (30 mg total) by mouth daily.  Dispense: 270 tablet; Refill: 0 - lamoTRIgine (LAMICTAL) 100 MG tablet; TAKE 1 TABLET BY MOUTH EVERY DAY  Dispense: 90 tablet; Refill: 0  3. Bipolar II disorder ([name]) - lamoTRIgine (LAMICTAL) 100 MG tablet; TAKE 1 TABLET BY MOUTH EVERY DAY  Dispense: 90 tablet; Refill: 0   Follow Up Instructions: In 2-3 months or sooner if needed   I discussed the assessment and treatment plan with the patient. The patient was provided an opportunity to ask questions and all were answered. The patient agreed with the plan and demonstrated an understanding of the instructions.   The patient was advised to call back or seek an in-person evaluation if the symptoms worsen or if the condition fails to improve as anticipated.  I provided 10 minutes of non-face-to-face time during this encounter.   [name], MD

## 2021-07-14 ENCOUNTER — Other Ambulatory Visit: Payer: Self-pay

## 2021-07-14 ENCOUNTER — Ambulatory Visit (HOSPITAL_COMMUNITY): Payer: 59 | Admitting: Licensed Clinical Social Worker

## 2021-07-21 ENCOUNTER — Other Ambulatory Visit: Payer: Self-pay

## 2021-07-21 ENCOUNTER — Ambulatory Visit (HOSPITAL_COMMUNITY): Payer: 59 | Admitting: Licensed Clinical Social Worker

## 2021-07-28 ENCOUNTER — Other Ambulatory Visit: Payer: Self-pay

## 2021-07-28 ENCOUNTER — Ambulatory Visit (HOSPITAL_COMMUNITY): Payer: 59 | Admitting: Licensed Clinical Social Worker

## 2021-08-04 ENCOUNTER — Other Ambulatory Visit: Payer: Self-pay

## 2021-08-04 ENCOUNTER — Ambulatory Visit (HOSPITAL_COMMUNITY): Payer: 59 | Admitting: Licensed Clinical Social Worker

## 2021-08-10 ENCOUNTER — Ambulatory Visit (HOSPITAL_COMMUNITY): Payer: 59 | Admitting: Licensed Clinical Social Worker

## 2021-08-10 ENCOUNTER — Other Ambulatory Visit: Payer: Self-pay

## 2021-08-11 ENCOUNTER — Ambulatory Visit (HOSPITAL_COMMUNITY): Payer: 59 | Admitting: Licensed Clinical Social Worker

## 2021-08-18 ENCOUNTER — Ambulatory Visit (INDEPENDENT_AMBULATORY_CARE_PROVIDER_SITE_OTHER): Payer: 59 | Admitting: Licensed Clinical Social Worker

## 2021-08-18 ENCOUNTER — Other Ambulatory Visit: Payer: Self-pay

## 2021-08-18 ENCOUNTER — Encounter (HOSPITAL_COMMUNITY): Payer: Self-pay | Admitting: Licensed Clinical Social Worker

## 2021-08-18 DIAGNOSIS — F411 Generalized anxiety disorder: Secondary | ICD-10-CM | POA: Diagnosis not present

## 2021-08-18 DIAGNOSIS — F3181 Bipolar II disorder: Secondary | ICD-10-CM

## 2021-08-18 NOTE — Progress Notes (Signed)
Virtual Visit via Video Note  I connected with Patricia Rivas on 08/18/21 at  9:00 AM EDT by a video enabled telemedicine application and verified that I am speaking with the correct person using two identifiers.  Location: Patient: home Provider: home office   I discussed the limitations of evaluation and management by telemedicine and the availability of in person appointments. The patient expressed understanding and agreed to proceed.  Type of Therapy: Individual Therapy   Treatment Goals addressed: "Address my own feelings about my past behaviors and forgive myself so I can have better relationships with my child and husband".  Patricia Rivas will report improvement in her mood, behaviors, and her relationships 5 out of 7 days.    Interventions:  CBT   Summary: Patricia Rivas is a 40 y.o. female who presents with Generalized Anxiety Disorder and Bipolar II.   Suicidal/Homicidal: No - without intent/plan   Therapist Response:   Patricia Rivas met with clinician for an individual session. Patricia Rivas discussed her psychiatric symptoms, her current life events and her homework. Patricia Rivas reports she has been doing pretty well, but she has been very busy. Clinician utilized CBT to process thoughts, feelings, and behaviors associated with her current activities. 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 Clinician processed the importance of forgiveness and allowing herself to recognize her own ability to change. Clinician also identified the value of being the adult she needed when she was a kid. Clinician discussed the changes that have been made related to addiction and maintaining her own mental health with meds and therapy. Clinician identified the hard work and effort put into her own well being. Clinician also encouraged Patricia Rivas to recognize her growth and progress.    Plan: Return again in 2 weeks.   Diagnosis: Axis I: Generalized  Anxiety Disorder and Bipolar II.      I discussed the assessment and treatment plan with the patient. The patient was provided an opportunity to ask questions and all were answered. The patient agreed with the plan and demonstrated an understanding of the instructions.   The patient was advised to call back or seek an in-person evaluation if the symptoms worsen or if the condition fails to improve as anticipated.  I provided 45 minutes of non-face-to-face time during this encounter.   [name], LCSW

## 2021-09-01 ENCOUNTER — Ambulatory Visit (HOSPITAL_COMMUNITY): Payer: 59 | Admitting: Licensed Clinical Social Worker

## 2021-09-01 ENCOUNTER — Other Ambulatory Visit: Payer: Self-pay

## 2021-09-14 ENCOUNTER — Encounter (HOSPITAL_COMMUNITY): Payer: Self-pay | Admitting: Licensed Clinical Social Worker

## 2021-09-14 ENCOUNTER — Other Ambulatory Visit: Payer: Self-pay

## 2021-09-14 ENCOUNTER — Ambulatory Visit (INDEPENDENT_AMBULATORY_CARE_PROVIDER_SITE_OTHER): Payer: 59 | Admitting: Licensed Clinical Social Worker

## 2021-09-14 DIAGNOSIS — F411 Generalized anxiety disorder: Secondary | ICD-10-CM

## 2021-09-14 DIAGNOSIS — F3181 Bipolar II disorder: Secondary | ICD-10-CM

## 2021-09-14 NOTE — Progress Notes (Signed)
Virtual Visit via Video Note  I connected with Patricia Rivas on 09/14/21 at  9:00 AM EST by a video enabled telemedicine application and verified that I am speaking with the correct person using two identifiers.  Location: Patient: home Provider: home office   I discussed the limitations of evaluation and management by telemedicine and the availability of in person appointments. The patient expressed understanding and agreed to proceed.  Type of Therapy: Individual Therapy   Treatment Goals addressed: "Address my own feelings about my past behaviors and forgive myself so I can have better relationships with my child and husband".  Amel will report improvement in her mood, behaviors, and her relationships 5 out of 7 days.    Interventions:  CBT   Summary: Patricia Rivas is a 40 y.o. female who presents with Generalized Anxiety Disorder and Bipolar II.   Suicidal/Homicidal: No - without intent/plan   Therapist Response:   Lyly met with clinician for an individual session. Ranika discussed her psychiatric symptoms, her current life events and her homework. Mycah reports that things are going well over. However, she also shared that she has been happy, but overwhelmed. Clinician utilized CBT to explore triggers to feeling overwhelmed. Clinician also noted that sometimes we have to do more things to help [name] feel better, even when we feel overwhelmed. Clinician identified [name]'s tendency to rebel against AA, her coping skills, etc. Clinician explored thought processed about this and noted that acceptance about her mental health, as well as willingness to take care of herself will be the key to helping herself feel better consistently. Clinician processed thoughts and feelings about going back into the office more frequently. Clinician identified that it requires increased social endurance to cope with people all day, in person.      Plan: Return again in 2 weeks.   Diagnosis:  Axis I: Generalized Anxiety Disorder and Bipolar II.     I discussed the assessment and treatment plan with the patient. The patient was provided an opportunity to ask questions and all were answered. The patient agreed with the plan and demonstrated an understanding of the instructions.   The patient was advised to call back or seek an in-person evaluation if the symptoms worsen or if the condition fails to improve as anticipated.  I provided 55 minutes of non-face-to-face time during this encounter.   [name], LCSW

## 2021-09-22 ENCOUNTER — Telehealth (HOSPITAL_BASED_OUTPATIENT_CLINIC_OR_DEPARTMENT_OTHER): Payer: 59 | Admitting: Psychiatry

## 2021-09-22 ENCOUNTER — Other Ambulatory Visit: Payer: Self-pay

## 2021-09-22 DIAGNOSIS — F3181 Bipolar II disorder: Secondary | ICD-10-CM | POA: Diagnosis not present

## 2021-09-22 DIAGNOSIS — F411 Generalized anxiety disorder: Secondary | ICD-10-CM | POA: Diagnosis not present

## 2021-09-22 DIAGNOSIS — F3281 Premenstrual dysphoric disorder: Secondary | ICD-10-CM | POA: Diagnosis not present

## 2021-09-22 MED ORDER — LAMOTRIGINE 25 MG PO TABS: ORAL_TABLET | ORAL | 0 refills | Status: DC

## 2021-09-22 MED ORDER — LAMOTRIGINE 150 MG PO TABS: 150.0000 mg | ORAL_TABLET | Freq: Every day | ORAL | 0 refills | Status: DC

## 2021-09-22 MED ORDER — GABAPENTIN 300 MG PO CAPS: 300.0000 mg | ORAL_CAPSULE | Freq: Four times a day (QID) | ORAL | 0 refills | Status: DC

## 2021-09-22 MED ORDER — CITALOPRAM HYDROBROMIDE 10 MG PO TABS: 30.0000 mg | ORAL_TABLET | Freq: Every day | ORAL | 0 refills | Status: DC

## 2021-09-22 NOTE — Progress Notes (Signed)
Virtual Visit via Video Note  I connected with Patricia Rivas on 09/22/21 at 10:00 AM EST by a video enabled telemedicine application and verified that I am speaking with the correct person using two identifiers.  Location: Patient: home Provider: office   I discussed the limitations of evaluation and management by telemedicine and the availability of in person appointments. The patient expressed understanding and agreed to proceed.  History of Present Illness: "For the most part they have been good". She has been in more therapy lately. Patricia Rivas is concerned about the Celexa is decreasing her libido significantly. Patricia Rivas is in couples therapy and that help with their intimacy. She has been paying attention to her moods and has noticed that about 7-10 days proir to the start of her menstrual cycle she has been having irritability with some labile mood, where she doesn't feel in control of her emotions. She has also has increased energy and racing thoughts. She does not want to stop the Lamictal and would like to increase it. Patricia Rivas shares her depression is much better. She is sleeping well. Overall anxiety is manageable but she has had 2 random, mild panic attacks. Patricia Rivas denies SI/HI.    Observations/Objective: Psychiatric Specialty Exam: ROS  unknown if currently breastfeeding.There is no height or weight on file to calculate BMI.  General Appearance: Fairly Groomed and Neat  Eye Contact:  Good  Speech:  Clear and Coherent and Normal Rate  Volume:  Normal  Mood:  Euthymic  Affect:  Full Range  Thought Process:  Goal Directed, Linear, and Descriptions of Associations: Intact  Orientation:  Full (Time, Place, and Person)  Thought Content:  Logical  Suicidal Thoughts:  No  Homicidal Thoughts:  No  Memory:  Immediate;   Good  Judgement:  Good  Insight:  Good  Psychomotor Activity:  Normal  Concentration:  Concentration: Good  Recall:  Good  Fund of Knowledge:  Good  Language:   Good  Akathisia:  No  Handed:  Right  AIMS (if indicated):     Assets:  Communication Skills Desire for Improvement Financial Resources/Insurance Housing Intimacy Leisure Time Resilience Social Support Talents/Skills Transportation Vocational/Educational  ADL's:  Intact  Cognition:  WNL  Sleep:        Assessment and Plan: Increase Lamictal to 125mg  qD for 14 days then increase to 150mg  po qD. [name] states she enough of the 100mg  tabs for 2 weeks.   1. GAD (generalized anxiety disorder) - gabapentin (NEURONTIN) 300 MG capsule; Take 1 capsule (300 mg total) by mouth 4 (four) times daily.  Dispense: 360 capsule; Refill: 0 - citalopram (CELEXA) 10 MG tablet; Take 3 tablets (30 mg total) by mouth daily.  Dispense: 270 tablet; Refill: 0  2. PMDD (premenstrual dysphoric disorder) - citalopram (CELEXA) 10 MG tablet; Take 3 tablets (30 mg total) by mouth daily.  Dispense: 270 tablet; Refill: 0  3. Bipolar II disorder ([name]) - lamoTRIgine (LAMICTAL) 25 MG tablet; Take 1 tab daily (total dose 125mg ) po qD for 14 days then increase to 150mg  tab  Dispense: 14 tablet; Refill: 0 - lamoTRIgine (LAMICTAL) 150 MG tablet; Take 1 tablet (150 mg total) by mouth daily.  Dispense: 90 tablet; Refill: 0   Follow Up Instructions: In 2-3 months or sooner if needed   I discussed the assessment and treatment plan with the patient. The patient was provided an opportunity to ask questions and all were answered. The patient agreed with the plan and demonstrated an understanding of the instructions.  The patient was advised to call back or seek an in-person evaluation if the symptoms worsen or if the condition fails to improve as anticipated.  I provided 18 minutes of non-face-to-face time during this encounter.   [name], MD

## 2021-09-28 ENCOUNTER — Ambulatory Visit (HOSPITAL_COMMUNITY): Payer: 59 | Admitting: Licensed Clinical Social Worker

## 2021-10-27 ENCOUNTER — Ambulatory Visit (INDEPENDENT_AMBULATORY_CARE_PROVIDER_SITE_OTHER): Payer: 59 | Admitting: Licensed Clinical Social Worker

## 2021-10-27 ENCOUNTER — Encounter (HOSPITAL_COMMUNITY): Payer: Self-pay | Admitting: Licensed Clinical Social Worker

## 2021-10-27 ENCOUNTER — Other Ambulatory Visit: Payer: Self-pay

## 2021-10-27 DIAGNOSIS — F411 Generalized anxiety disorder: Secondary | ICD-10-CM

## 2021-10-27 DIAGNOSIS — F3181 Bipolar II disorder: Secondary | ICD-10-CM

## 2021-10-27 NOTE — Progress Notes (Signed)
Virtual Visit via Video Note  I connected with Patricia Rivas on 10/27/21 at  9:00 AM EST by a video enabled telemedicine application and verified that I am speaking with the correct person using two identifiers.  Location: Patient: home Provider: home office   I discussed the limitations of evaluation and management by telemedicine and the availability of in person appointments. The patient expressed understanding and agreed to proceed.   Type of Therapy: Individual Therapy   Treatment Goals addressed: Address my own feelings about my past behaviors and forgive myself so I can have better relationships with my child and husband.  Shanie will report improvement in her mood, behaviors, and her relationships 5 out of 7 days.    Interventions:  CBT   Summary: Grasiela Jonsson is a 41 y.o. female who presents with Generalized Anxiety Disorder and Bipolar II.   Suicidal/Homicidal: No - without intent/plan   Therapist Response:   Shenna met with clinician for an individual session. Aimi discussed her psychiatric symptoms, her current life events and her homework. Emmalyn reports that she had some concerns about helping her daughter with some anxiety and OCD type behaviors. Clinician provided feedback on options for coping skills and deep breathing that can be done by Paiten with daughter for now. Clinician also identified the developmental challenges that are occurring at this time. Clinician reminded Noheli about her own experiences with anxiety and encouraged Japji to keep her self-care activities in check so that her daughter's anxiety doesn't trigger her. Clinician processed recent changes in relationship with her parents. Sonji shared increased interactions and more positive times with dad. Malaak also identified some increased resentment and anger toward mother, who has some health problems at this time. Clinician explored underlying feelings about parents that get covered up  by anger. Clinician also encouraged Saveah to recognize the improvements, changes, and growth made by parents and by herself over the past many years.      Plan: Return again in 2 weeks.   Diagnosis: Axis I: Generalized Anxiety Disorder and Bipolar II.      I discussed the assessment and treatment plan with the patient. The patient was provided an opportunity to ask questions and all were answered. The patient agreed with the plan and demonstrated an understanding of the instructions.   The patient was advised to call back or seek an in-person evaluation if the symptoms worsen or if the condition fails to improve as anticipated.  I provided 55 minutes of non-face-to-face time during this encounter.   [name], LCSW

## 2021-11-03 ENCOUNTER — Encounter (HOSPITAL_COMMUNITY): Payer: Self-pay | Admitting: Psychiatry

## 2021-11-03 ENCOUNTER — Telehealth (HOSPITAL_BASED_OUTPATIENT_CLINIC_OR_DEPARTMENT_OTHER): Payer: 59 | Admitting: Psychiatry

## 2021-11-03 ENCOUNTER — Other Ambulatory Visit: Payer: Self-pay

## 2021-11-03 DIAGNOSIS — F3281 Premenstrual dysphoric disorder: Secondary | ICD-10-CM | POA: Diagnosis not present

## 2021-11-03 DIAGNOSIS — F411 Generalized anxiety disorder: Secondary | ICD-10-CM | POA: Diagnosis not present

## 2021-11-03 DIAGNOSIS — F3181 Bipolar II disorder: Secondary | ICD-10-CM

## 2021-11-03 NOTE — Progress Notes (Signed)
Virtual Visit via Video Note  I connected with Patricia Rivas on 11/03/21 at 10:30 AM EST by a video enabled telemedicine application and verified that I am speaking with the correct person using two identifiers.  Location: Patient: home Provider: office   I discussed the limitations of evaluation and management by telemedicine and the availability of in person appointments. The patient expressed understanding and agreed to proceed.  History of Present Illness: Patricia Rivas is recovering from COVID. She is still having some congestion. Her anxiety "has been fine". She can tell a big difference in mood and anxiety with the increase in Lamictal. She doesn't feel out of control anymore. Patricia Rivas just started her menstrual cycle and states her mood has been very stable. She denies any irritability and her family has stated the same. She feels calmer overall. She denies any depression or manic/hypomanic like symptoms. Patricia Rivas is a little concerned that she might get down due to the winter season. She denies isolation, anhedonia and hopelessness. Her energy level feels more stable thru the out the day. At the end of the day she is tired and is sleeping well. Patricia Rivas denies SI/HI.    Observations/Objective: Psychiatric Specialty Exam: ROS  unknown if currently breastfeeding.There is no height or weight on file to calculate BMI.  General Appearance: Casual  Eye Contact:  Good  Speech:  Clear and Coherent and Normal Rate  Volume:  Normal  Mood:  Euthymic  Affect:  Full Range  Thought Process:  Goal Directed, Linear, and Descriptions of Associations: Intact  Orientation:  Full (Time, Place, and Person)  Thought Content:  Logical  Suicidal Thoughts:  No  Homicidal Thoughts:  No  Memory:  Immediate;   Good  Judgement:  Good  Insight:  Good  Psychomotor Activity:  Normal  Concentration:  Concentration: Good  Recall:  Good  Fund of Knowledge:  Good  Language:  Good  Akathisia:  No  Handed:  Right   AIMS (if indicated):     Assets:  Communication Skills Desire for Improvement Financial Resources/Insurance Housing Intimacy Leisure Time Resilience Social Support Talents/Skills Transportation Vocational/Educational  ADL's:  Intact  Cognition:  WNL  Sleep:        Assessment and Plan: Depression screen [facility] 2/9 11/03/2021 04/28/2021 12/23/2020  Decreased Interest 0 0 0  Down, Depressed, Hopeless 0 0 0  PHQ - 2 Score 0 0 0    Flowsheet Row Video Visit [facility] 11/03/2021 in [facility] Video Visit from 04/28/2021 in [facility] Video Visit from 12/23/2020 in [facility]  C-SSRS RISK CATEGORY No Risk No Risk No Risk       1. GAD (generalized anxiety disorder)  2. Bipolar II disorder ([name])  3. PMDD (premenstrual dysphoric disorder)   - continue Lamictal 150mg  qD and Celexa 30mg  qD and Neurontin 300mg  QID  - she continues individual and marriage therapy  Follow Up Instructions: In 2-3 months or sooner if needed   I discussed the assessment and treatment plan with the patient. The patient was provided an opportunity to ask questions and all were answered. The patient agreed with the plan and demonstrated an understanding of the instructions.   The patient was advised to call back or seek an in-person evaluation if the symptoms worsen or if the condition fails to improve as anticipated.  I provided 10 minutes of non-face-to-face time during this encounter.   , MD

## 2021-11-10 ENCOUNTER — Other Ambulatory Visit: Payer: Self-pay

## 2021-11-10 ENCOUNTER — Encounter (HOSPITAL_COMMUNITY): Payer: Self-pay | Admitting: Licensed Clinical Social Worker

## 2021-11-10 ENCOUNTER — Ambulatory Visit (INDEPENDENT_AMBULATORY_CARE_PROVIDER_SITE_OTHER): Payer: 59 | Admitting: Licensed Clinical Social Worker

## 2021-11-10 DIAGNOSIS — F411 Generalized anxiety disorder: Secondary | ICD-10-CM | POA: Diagnosis not present

## 2021-11-10 DIAGNOSIS — F3181 Bipolar II disorder: Secondary | ICD-10-CM | POA: Diagnosis not present

## 2021-11-10 NOTE — Progress Notes (Signed)
Virtual Visit via Video Note  I connected with Patricia Rivas on 11/10/21 at  9:00 AM EST by a video enabled telemedicine application and verified that I am speaking with the correct person using two identifiers.  Location: Patient: home Provider: home office   I discussed the limitations of evaluation and management by telemedicine and the availability of in person appointments. The patient expressed understanding and agreed to proceed.     Type of Therapy: Individual Therapy   Treatment Goals addressed: Address my own feelings about my past behaviors and forgive myself so I can have better relationships with my child and husband.  Patricia Rivas will report improvement in her mood, behaviors, and her relationships 5 out of 7 days.    Interventions:  CBT   Summary: Patricia Rivas is a 41 y.o. female who presents with Generalized Anxiety Disorder and Bipolar II.   Suicidal/Homicidal: No - without intent/plan   Therapist Response:   Patricia Rivas met with clinician for an individual session. Patricia Rivas discussed her psychiatric symptoms, her current life events and her homework. Patricia Rivas reports that she has been doing a little better lately. She reports an improvement in her relationship with dad, which is a big step for her. Clinician utilized CBT to process thoughts, feelings, and behaviors related to these weekly lunches. Patricia Rivas shared that she feels closer to him and she recognizes that he has changed. Patricia Rivas processed an incident with daughter which triggered some angst and concern about her reaction. Clinician processed the event and identified that she is getting triggered by something from the past. Clinician offered options for coping with this, and discussed whether or not this was the time to process past trauma. Patricia Rivas shared concern that things are going very well and doing more trauma processing at this time would interfere with her daily life. Clinician returned responsibility to  [facility] about her decision about when to dig around in her past, but agreed that now is not a good time. Clinician noted the improvement in mood stability with increased dose of Lamictdal.      Plan: Return again in 2 weeks.   Diagnosis: Axis I: Generalized Anxiety Disorder and Bipolar II.   I discussed the assessment and treatment plan with the patient. The patient was provided an opportunity to ask questions and all were answered. The patient agreed with the plan and demonstrated an understanding of the instructions.   The patient was advised to call back or seek an in-person evaluation if the symptoms worsen or if the condition fails to improve as anticipated.  I provided 55 minutes of non-face-to-face time during this encounter.   Patricia Curling, LCSW

## 2021-11-24 ENCOUNTER — Ambulatory Visit (HOSPITAL_COMMUNITY): Payer: 59 | Admitting: Licensed Clinical Social Worker

## 2021-12-08 ENCOUNTER — Ambulatory Visit (INDEPENDENT_AMBULATORY_CARE_PROVIDER_SITE_OTHER): Payer: 59 | Admitting: Licensed Clinical Social Worker

## 2021-12-08 ENCOUNTER — Other Ambulatory Visit: Payer: Self-pay

## 2021-12-08 ENCOUNTER — Encounter (HOSPITAL_COMMUNITY): Payer: Self-pay | Admitting: Licensed Clinical Social Worker

## 2021-12-08 DIAGNOSIS — F3181 Bipolar II disorder: Secondary | ICD-10-CM

## 2021-12-08 DIAGNOSIS — F411 Generalized anxiety disorder: Secondary | ICD-10-CM

## 2021-12-08 NOTE — Progress Notes (Signed)
Virtual Visit via Video Note  I connected with Patricia Rivas on 12/08/21 at  9:00 AM EST by a video enabled telemedicine application and verified that I am speaking with the correct person using two identifiers.  Location: Patient: home Provider: home office   I discussed the limitations of evaluation and management by telemedicine and the availability of in person appointments. The patient expressed understanding and agreed to proceed.   Type of Therapy: Individual Therapy   Treatment Goals addressed: Address my own feelings about my past behaviors and forgive myself so I can have better relationships with my child and husband.  Patricia Rivas will report improvement in her mood, behaviors, and her relationships 5 out of 7 days.    Interventions:  CBT   Summary: Patricia Rivas is a 41 y.o. female who presents with Generalized Anxiety Disorder and Bipolar II.   Suicidal/Homicidal: No - without intent/plan   Therapist Response:   Patricia Rivas met with clinician for an individual session. Patricia Rivas discussed her psychiatric symptoms, her current life events and her homework. Patricia Rivas reports that she has been very busy with work and home. She shared that things overall have been pretty stable. She shared updates on weight loss progress since having surgery 1.5 years ago. Patricia Rivas reports she has lost over 100 lbs and she continues to work toward her goal weight. Clinician explored the emotional aspect of losing that amount of weight and how her brain has adjusted. Patricia Rivas identified ongoing body dysmorphia and noted that she maintains the urge to cover up in Patricia Rivas, even though she fits a L now. Clinician utilized CBT to provided psychoeducation about this aspect of self esteem. Clinician also noted that she gets more attention now, whereas before she could disappear into the background. Clinician discussed taking small steps to update her wardrobe. Clinician also encouraged Patricia Rivas to speak  kindly to herself about her body and to be proud of her hard work.       Plan: Return again in 2 weeks.   Diagnosis: Axis I: Generalized Anxiety Disorder and Bipolar II.       I discussed the assessment and treatment plan with the patient. The patient was provided an opportunity to ask questions and all were answered. The patient agreed with the plan and demonstrated an understanding of the instructions.   The patient was advised to call back or seek an in-person evaluation if the symptoms worsen or if the condition fails to improve as anticipated.  I provided 30 minutes of non-face-to-face time during this encounter.   [name], LCSW

## 2021-12-14 ENCOUNTER — Other Ambulatory Visit: Payer: Self-pay

## 2021-12-14 ENCOUNTER — Ambulatory Visit (HOSPITAL_COMMUNITY): Payer: 59 | Admitting: Licensed Clinical Social Worker

## 2021-12-19 ENCOUNTER — Other Ambulatory Visit (HOSPITAL_COMMUNITY): Payer: Self-pay | Admitting: Psychiatry

## 2021-12-19 DIAGNOSIS — F3181 Bipolar II disorder: Secondary | ICD-10-CM

## 2022-01-05 ENCOUNTER — Telehealth (HOSPITAL_BASED_OUTPATIENT_CLINIC_OR_DEPARTMENT_OTHER): Payer: 59 | Admitting: Psychiatry

## 2022-01-05 ENCOUNTER — Other Ambulatory Visit: Payer: Self-pay

## 2022-01-05 DIAGNOSIS — F3181 Bipolar II disorder: Secondary | ICD-10-CM

## 2022-01-05 DIAGNOSIS — F3281 Premenstrual dysphoric disorder: Secondary | ICD-10-CM

## 2022-01-05 DIAGNOSIS — F411 Generalized anxiety disorder: Secondary | ICD-10-CM | POA: Diagnosis not present

## 2022-01-05 MED ORDER — CITALOPRAM HYDROBROMIDE 10 MG PO TABS: 30.0000 mg | ORAL_TABLET | Freq: Every day | ORAL | 0 refills | Status: DC

## 2022-01-05 MED ORDER — GABAPENTIN 300 MG PO CAPS: 300.0000 mg | ORAL_CAPSULE | Freq: Four times a day (QID) | ORAL | 0 refills | Status: DC

## 2022-01-05 MED ORDER — LAMOTRIGINE 25 MG PO TABS: 25.0000 mg | ORAL_TABLET | Freq: Every day | ORAL | 0 refills | Status: DC

## 2022-01-05 MED ORDER — LAMOTRIGINE 150 MG PO TABS: 150.0000 mg | ORAL_TABLET | Freq: Every day | ORAL | 0 refills | Status: DC

## 2022-01-05 NOTE — Progress Notes (Signed)
Virtual Visit via Telephone Note ? ?I connected with Patricia Rivas on 01/05/22 at 10:30 AM EDT by telephone and verified that I am speaking with the correct person using two identifiers. [place] stable Internet connection so we opted to continue by phone.  ? ?Location: ?Patient: in parked today ?Provider: office ?  ?I discussed the limitations, risks, security and privacy concerns of performing an evaluation and management service by telephone and the availability of in person appointments. I also discussed with the patient that there may be a patient responsible charge related to this service. The patient expressed understanding and agreed to proceed. ? ? ?History of Present Illness: ?Overall Patricia Rivas is noticing significant improvement in her mood around her period. She denies depression and anxiety symptoms. Patricia Rivas does have some excitability, increased talkativeness, mild increase in energy. She is not "euphoric" or impulsivity. It lasts about a week around her period. It is a noticeable change but not severe. She wonders if going on the Lamictal will help. Patricia Rivas is eating and sleeping well. She denies SI/HI.  ?  ?Observations/Objective: ? ?General Appearance: unable to assess  ?Eye Contact:  unable to assess  ?Speech:  Clear and Coherent and Normal Rate  ?Volume:  Normal  ?Mood:  Euthymic  ?Affect:  Full Range  ?Thought Process:  Goal Directed, Linear, and Descriptions of Associations: Intact  ?Orientation:  Full (Time, Place, and Person)  ?Thought Content:  Logical  ?Suicidal Thoughts:  No  ?Homicidal Thoughts:  No  ?Memory:  Immediate;   Good  ?Judgement:  Good  ?Insight:  Good  ?Psychomotor Activity: unable to assess  ?Concentration:  Concentration: Good  ?Recall:  Good  ?Fund of Knowledge:  Good  ?Language:  Good  ?Akathisia:  unable to assess  ?Handed:  unable to assess  ?AIMS (if indicated):     ?Assets:  Communication Skills ?Desire for Improvement ?Financial  Resources/Insurance ?Housing ?Intimacy ?Leisure Time ?Resilience ?Social Support ?Talents/Skills ?Transportation ?Vocational/Educational  ?ADL's:  unable to assess  ?Cognition:  WNL  ?Sleep:     ? ? ? ?Assessment and Plan: ?Depression screen [facility] 2/9 01/05/2022 11/03/2021 04/28/2021 12/23/2020  ?Decreased Interest 0 0 0 0  ?Down, Depressed, Hopeless 0 0 0 0  ?PHQ - 2 Score 0 0 0 0  ? ?Flowsheet Row Video Visit from 01/05/2022 in [facility] Video Visit from 11/03/2021 in [facility] Video Visit from 04/28/2021 in [facility]  ?C-SSRS RISK CATEGORY No Risk No Risk No Risk  ? ?  ? ?Increase Lamictal to 175mg  po qD for mood ? ?1. PMDD (premenstrual dysphoric disorder) ?- citalopram (CELEXA) 10 MG tablet; Take 3 tablets (30 mg total) by mouth daily.  Dispense: 270 tablet; Refill: 0 ? ?2. Bipolar II disorder ([name]) ?- lamoTRIgine (LAMICTAL) 150 MG tablet; Take 1 tablet (150 mg total) by mouth daily.  Dispense: 90 tablet; Refill: 0 ?- lamoTRIgine (LAMICTAL) 25 MG tablet; Take 1 tablet (25 mg total) by mouth daily.  Dispense: 90 tablet; Refill: 0 ? ?3. GAD (generalized anxiety disorder) ?- citalopram (CELEXA) 10 MG tablet; Take 3 tablets (30 mg total) by mouth daily.  Dispense: 270 tablet; Refill: 0 ?- gabapentin (NEURONTIN) 300 MG capsule; Take 1 capsule (300 mg total) by mouth 4 (four) times daily.  Dispense: 360 capsule; Refill: 0 ? ? ? ? ?Follow Up Instructions: ?In 2-3 months or sooner if needed ?  ?I discussed the assessment  and treatment plan with the patient. The patient was provided an opportunity to ask questions and all were answered. The patient agreed with the plan and demonstrated an understanding of the instructions. ?  ?The patient was advised to call back or seek an in-person evaluation if the symptoms worsen or if the condition fails to improve as anticipated. ? ?I provided 9 minutes of  non-face-to-face time during this encounter. ? ? ?[name], MD ? ?

## 2022-01-13 ENCOUNTER — Other Ambulatory Visit (HOSPITAL_COMMUNITY): Payer: Self-pay | Admitting: Physician Assistant

## 2022-01-13 DIAGNOSIS — R002 Palpitations: Secondary | ICD-10-CM

## 2022-01-17 ENCOUNTER — Ambulatory Visit (INDEPENDENT_AMBULATORY_CARE_PROVIDER_SITE_OTHER): Payer: Managed Care, Other (non HMO)

## 2022-01-17 ENCOUNTER — Other Ambulatory Visit: Payer: Self-pay | Admitting: *Deleted

## 2022-01-17 DIAGNOSIS — R002 Palpitations: Secondary | ICD-10-CM

## 2022-01-17 NOTE — Progress Notes (Unsigned)
Enrolled for [employer] to mail a ZIO XT long term holter monitor to the patients address on file.  

## 2022-01-22 DIAGNOSIS — R002 Palpitations: Secondary | ICD-10-CM | POA: Diagnosis not present

## 2022-01-24 ENCOUNTER — Ambulatory Visit (HOSPITAL_COMMUNITY): Payer: Managed Care, Other (non HMO) | Attending: Physician Assistant

## 2022-01-24 DIAGNOSIS — R002 Palpitations: Secondary | ICD-10-CM | POA: Insufficient documentation

## 2022-01-24 DIAGNOSIS — E785 Hyperlipidemia, unspecified: Secondary | ICD-10-CM | POA: Insufficient documentation

## 2022-01-24 DIAGNOSIS — Z87891 Personal history of nicotine dependence: Secondary | ICD-10-CM | POA: Insufficient documentation

## 2022-01-24 DIAGNOSIS — G4730 Sleep apnea, unspecified: Secondary | ICD-10-CM | POA: Diagnosis not present

## 2022-01-24 LAB — ECHOCARDIOGRAM COMPLETE
Area-P 1/2: 3.6 cm2
S' Lateral: 3.1 cm

## 2022-02-28 ENCOUNTER — Ambulatory Visit: Payer: Self-pay | Admitting: Internal Medicine

## 2022-03-30 ENCOUNTER — Telehealth (HOSPITAL_BASED_OUTPATIENT_CLINIC_OR_DEPARTMENT_OTHER): Payer: 59 | Admitting: Psychiatry

## 2022-03-30 ENCOUNTER — Encounter (HOSPITAL_COMMUNITY): Payer: Self-pay | Admitting: Psychiatry

## 2022-03-30 DIAGNOSIS — F411 Generalized anxiety disorder: Secondary | ICD-10-CM

## 2022-03-30 DIAGNOSIS — F3281 Premenstrual dysphoric disorder: Secondary | ICD-10-CM

## 2022-03-30 DIAGNOSIS — F3181 Bipolar II disorder: Secondary | ICD-10-CM

## 2022-03-30 MED ORDER — LAMOTRIGINE 25 MG PO TABS: 25.0000 mg | ORAL_TABLET | Freq: Every day | ORAL | 0 refills | Status: DC

## 2022-03-30 MED ORDER — GABAPENTIN 300 MG PO CAPS: 300.0000 mg | ORAL_CAPSULE | Freq: Four times a day (QID) | ORAL | 0 refills | Status: DC

## 2022-03-30 MED ORDER — CITALOPRAM HYDROBROMIDE 10 MG PO TABS: 30.0000 mg | ORAL_TABLET | Freq: Every day | ORAL | 0 refills | Status: DC

## 2022-03-30 MED ORDER — LAMOTRIGINE 150 MG PO TABS: 150.0000 mg | ORAL_TABLET | Freq: Every day | ORAL | 0 refills | Status: DC

## 2022-03-30 NOTE — Progress Notes (Signed)
Virtual Visit via Video Note  I connected with Patricia Rivas on 03/30/22 at 11:00 AM EDT by a video enabled telemedicine application and verified that I am speaking with the correct person using two identifiers.  Location: Patient: at home Provider: office   I discussed the limitations of evaluation and management by telemedicine and the availability of in person appointments. The patient expressed understanding and agreed to proceed.  History of Present Illness: "Doing good". Patricia Rivas is going to [employer] this weekend. She is looking forward to it. Her mood has been stable. Patricia Rivas denies any manic or hypomanic like symptoms and episodes. She can't even recall when she was last hypomanic. Around her period she is more tired and irritable but it feels normal rather than out of control like before. Her anxiety is mild and manageable. She denies any recent panic attacks. Most night her sleep is good. Her appetite is good. She denies SI/HI.  Patricia Rivas denies any drug use or relapse. She is using coping techniques and attending meetings.    Observations/Objective: Psychiatric Specialty Exam: ROS  unknown if currently breastfeeding.There is no height or weight on file to calculate BMI.  General Appearance: Casual, Neat, and Well Groomed  Eye Contact:  Good  Speech:  Clear and Coherent and Normal Rate  Volume:  Normal  Mood:  Euthymic  Affect:  Full Range  Thought Process:  Goal Directed, Linear, and Descriptions of Associations: Intact  Orientation:  Full (Time, Place, and Person)  Thought Content:  Logical  Suicidal Thoughts:  No  Homicidal Thoughts:  No  Memory:  Immediate;   Good  Judgement:  Good  Insight:  Good  Psychomotor Activity:  Normal  Concentration:  Concentration: Good  Recall:  Good  Fund of Knowledge:  Good  Language:  Good  Akathisia:  No  Handed:  Right  AIMS (if indicated):     Assets:  Communication Skills Desire for Improvement Financial  Resources/Insurance Housing Intimacy Leisure Time Resilience Social Support Talents/Skills Transportation Vocational/Educational  ADL's:  Intact  Cognition:  WNL  Sleep:        Assessment and Plan:     03/30/2022   11:09 AM 01/05/2022   10:38 AM 11/03/2021   10:35 AM 04/28/2021    8:40 AM 12/23/2020   12:57 PM  Depression screen PHQ 2/9  Decreased Interest 0 0 0 0 0  Down, Depressed, Hopeless 0 0 0 0 0  PHQ - 2 Score 0 0 0 0 0    Flowsheet Row Video Visit from 03/30/2022 in [facility] Video Visit from 01/05/2022 in [facility] Video Visit from 11/03/2021 in [facility]  C-SSRS RISK CATEGORY No Risk No Risk No Risk        The risk of un-intended pregnancy is low based on the fact that pt reports she is taking birth control pills. Pt is aware that these meds carry a teratogenic risk. Pt will discuss plan of action if she does or plans to become pregnant in the future.  Status of current problems: stable  Meds:  1. PMDD (premenstrual dysphoric disorder) - citalopram (CELEXA) 10 MG tablet; Take 3 tablets (30 mg total) by mouth daily.  Dispense: 270 tablet; Refill: 0  2. GAD (generalized anxiety disorder) - citalopram (CELEXA) 10 MG tablet; Take 3 tablets (30 mg total) by mouth daily.  Dispense: 270 tablet; Refill: 0 - gabapentin (NEURONTIN) 300 MG capsule; Take 1 capsule (300 mg total)  by mouth 4 (four) times daily.  Dispense: 360 capsule; Refill: 0  3. Bipolar II disorder ([name]) - lamoTRIgine (LAMICTAL) 150 MG tablet; Take 1 tablet (150 mg total) by mouth daily.  Dispense: 90 tablet; Refill: 0 - lamoTRIgine (LAMICTAL) 25 MG tablet; Take 1 tablet (25 mg total) by mouth daily.  Dispense: 90 tablet; Refill: 0     Labs: none today    Therapy: brief supportive therapy provided.  Collaboration of Care: Other none today  Patient/Guardian was advised Release  of Information must be obtained prior to any record release in order to collaborate their care with an outside provider. Patient/Guardian was advised if they have not already done so to contact the registration department to sign all necessary forms in order for [name] to release information regarding their care.   Consent: Patient/Guardian gives verbal consent for treatment and assignment of benefits for services provided during this visit. Patient/Guardian expressed understanding and agreed to proceed.    Follow Up Instructions: Follow up in 2-3 months or sooner if needed    I discussed the assessment and treatment plan with the patient. The patient was provided an opportunity to ask questions and all were answered. The patient agreed with the plan and demonstrated an understanding of the instructions.   The patient was advised to call back or seek an in-person evaluation if the symptoms worsen or if the condition fails to improve as anticipated.  I provided 8 minutes of non-face-to-face time during this encounter.   [name], MD

## 2022-06-22 ENCOUNTER — Telehealth (HOSPITAL_COMMUNITY): Payer: Managed Care, Other (non HMO) | Admitting: Psychiatry

## 2022-06-29 ENCOUNTER — Encounter (HOSPITAL_COMMUNITY): Payer: Self-pay | Admitting: Psychiatry

## 2022-06-29 ENCOUNTER — Telehealth (HOSPITAL_BASED_OUTPATIENT_CLINIC_OR_DEPARTMENT_OTHER): Payer: 59 | Admitting: Psychiatry

## 2022-06-29 ENCOUNTER — Other Ambulatory Visit (HOSPITAL_COMMUNITY): Payer: Self-pay | Admitting: Psychiatry

## 2022-06-29 DIAGNOSIS — F411 Generalized anxiety disorder: Secondary | ICD-10-CM

## 2022-06-29 DIAGNOSIS — F3281 Premenstrual dysphoric disorder: Secondary | ICD-10-CM | POA: Diagnosis not present

## 2022-06-29 DIAGNOSIS — F3181 Bipolar II disorder: Secondary | ICD-10-CM

## 2022-06-29 MED ORDER — LAMOTRIGINE 200 MG PO TABS: 200.0000 mg | ORAL_TABLET | Freq: Every day | ORAL | 2 refills | Status: DC

## 2022-06-29 MED ORDER — CITALOPRAM HYDROBROMIDE 10 MG PO TABS: 30.0000 mg | ORAL_TABLET | Freq: Every day | ORAL | 2 refills | Status: DC

## 2022-06-29 MED ORDER — GABAPENTIN 300 MG PO CAPS: 300.0000 mg | ORAL_CAPSULE | Freq: Four times a day (QID) | ORAL | 2 refills | Status: DC

## 2022-06-29 NOTE — Progress Notes (Signed)
Virtual Visit via Video Note  I connected with Patricia Rivas on 06/29/22 at  9:45 AM EDT by   a video enabled telemedicine application and verified that I am speaking with the correct person using two identifiers.  Location: Patient: Home Provider: office   I discussed the limitations of evaluation and management by telemedicine and the availability of in person appointments. The patient expressed understanding and agreed to proceed.  History of Present Illness: "Doing pretty good". Patricia Rivas shares that she feels hypomanic "on the lightest level". She has been experiencing an increase in energy around the time of her menstrual cycle. She is perimenopausal so her menstrual cycle is unpredictable. The increase in energy does not bother her but she is also mildly irritable and more sensitive to loud noises. It is noticeable but not disruptive to her life. She denies any impulsive actions or financial extravagance. She will usually clean her house and get some things done. The symptoms last for 2-3 days and then resolve. This last week her sleep is broken some nights. It is random when she has a bad night. Patricia Rivas wakes up rested.Patricia Rivas denies depression and anhedonia. Her husband told her that these last 2-3 nights she has been more tired and wanting more rest. She denies SI/HI. Patricia Rivas shares that her anxiety is stable. She has not experienced any break thru anxiety.    Observations/Objective: Psychiatric Specialty Exam: ROS  unknown if currently breastfeeding.There is no height or weight on file to calculate BMI.  General Appearance: Casual and Fairly Groomed  Eye Contact:  Good  Speech:  Clear and Coherent and Normal Rate  Volume:  Normal  Mood:  Euthymic  Affect:  Full Range  Thought Process:  Goal Directed, Linear, and Descriptions of Associations: Intact  Orientation:  Full (Time, Place, and Person)  Thought Content:  Logical  Suicidal Thoughts:  No  Homicidal Thoughts:  No   Memory:  Immediate;   Good  Judgement:  Good  Insight:  Good  Psychomotor Activity:  Normal  Concentration:  Concentration: Good  Recall:  Good  Fund of Knowledge:  Good  Language:  Good  Akathisia:  No  Handed:  Right  AIMS (if indicated):     Assets:  Communication Skills Desire for Improvement Financial Resources/Insurance Housing Intimacy Leisure Time Physical Health Resilience Social Support Talents/Skills Transportation Vocational/Educational  ADL's:  Intact  Cognition:  WNL  Sleep:        Assessment and Plan:     06/29/2022   10:06 AM 03/30/2022   11:09 AM 01/05/2022   10:38 AM 11/03/2021   10:35 AM 04/28/2021    8:40 AM  Depression screen PHQ 2/9  Decreased Interest 0 0 0 0 0  Down, Depressed, Hopeless 0 0 0 0 0  PHQ - 2 Score 0 0 0 0 0    Flowsheet Row Video Visit from 06/29/2022 in [facility] Video Visit from 03/30/2022 in [facility] Video Visit from 01/05/2022 in [facility]  C-SSRS RISK CATEGORY No Risk No Risk No Risk         Pt is aware that these meds carry a teratogenic risk. Pt will discuss plan of action if she does or plans to become pregnant in the future.  Status of current problems: mild hypomanic like symptoms with menstrual cycle.  Meds: increase Lamictal 200mg  po qD for mood stabilization 1. PMDD (premenstrual dysphoric disorder) - citalopram (CELEXA) 10 MG tablet; Take 3  tablets (30 mg total) by mouth daily.  Dispense: 90 tablet; Refill: 2  2. Bipolar II disorder ([name])  3. GAD (generalized anxiety disorder) - citalopram (CELEXA) 10 MG tablet; Take 3 tablets (30 mg total) by mouth daily.  Dispense: 90 tablet; Refill: 2 - gabapentin (NEURONTIN) 300 MG capsule; Take 1 capsule (300 mg total) by mouth 4 (four) times daily.  Dispense: 120 capsule; Refill: 2     Labs: none    Therapy: brief supportive therapy provided.  Discussed psychosocial stressors in detail    Collaboration of Care: Other none  Patient/Guardian was advised Release of Information must be obtained prior to any record release in order to collaborate their care with an outside provider. Patient/Guardian was advised if they have not already done so to contact the registration department to sign all necessary forms in order for [name] to release information regarding their care.   Consent: Patient/Guardian gives verbal consent for treatment and assignment of benefits for services provided during this visit. Patient/Guardian expressed understanding and agreed to proceed.     Follow Up Instructions: Follow up in 2-3 months or sooner if needed    I discussed the assessment and treatment plan with the patient. The patient was provided an opportunity to ask questions and all were answered. The patient agreed with the plan and demonstrated an understanding of the instructions.   The patient was advised to call back or seek an in-person evaluation if the symptoms worsen or if the condition fails to improve as anticipated.  I provided 15 minutes of non-face-to-face time during this encounter.   [name], MD

## 2022-07-17 ENCOUNTER — Other Ambulatory Visit: Payer: Self-pay | Admitting: Obstetrics and Gynecology

## 2022-07-17 DIAGNOSIS — Z1231 Encounter for screening mammogram for malignant neoplasm of breast: Secondary | ICD-10-CM

## 2022-07-19 ENCOUNTER — Ambulatory Visit
Admission: RE | Admit: 2022-07-19 | Discharge: 2022-07-19 | Disposition: A | Discharge status: Home / Self Care | Payer: Managed Care, Other (non HMO) | Source: Ambulatory Visit | Attending: Obstetrics and Gynecology | Admitting: Obstetrics and Gynecology

## 2022-07-19 DIAGNOSIS — Z1231 Encounter for screening mammogram for malignant neoplasm of breast: Secondary | ICD-10-CM

## 2022-08-15 ENCOUNTER — Other Ambulatory Visit (HOSPITAL_COMMUNITY): Payer: Self-pay | Admitting: Psychiatry

## 2022-08-15 DIAGNOSIS — F3281 Premenstrual dysphoric disorder: Secondary | ICD-10-CM

## 2022-08-15 DIAGNOSIS — F411 Generalized anxiety disorder: Secondary | ICD-10-CM

## 2022-08-31 ENCOUNTER — Telehealth (HOSPITAL_BASED_OUTPATIENT_CLINIC_OR_DEPARTMENT_OTHER): Payer: 59 | Admitting: Psychiatry

## 2022-08-31 ENCOUNTER — Encounter (HOSPITAL_COMMUNITY): Payer: Self-pay | Admitting: Licensed Clinical Social Worker

## 2022-08-31 ENCOUNTER — Ambulatory Visit (INDEPENDENT_AMBULATORY_CARE_PROVIDER_SITE_OTHER): Payer: 59 | Admitting: Licensed Clinical Social Worker

## 2022-08-31 ENCOUNTER — Encounter (HOSPITAL_COMMUNITY): Payer: Self-pay | Admitting: Psychiatry

## 2022-08-31 DIAGNOSIS — F411 Generalized anxiety disorder: Secondary | ICD-10-CM

## 2022-08-31 DIAGNOSIS — F3181 Bipolar II disorder: Secondary | ICD-10-CM | POA: Diagnosis not present

## 2022-08-31 DIAGNOSIS — F3281 Premenstrual dysphoric disorder: Secondary | ICD-10-CM | POA: Diagnosis not present

## 2022-08-31 MED ORDER — GABAPENTIN 300 MG PO CAPS: 300.0000 mg | ORAL_CAPSULE | Freq: Four times a day (QID) | ORAL | 0 refills | Status: DC

## 2022-08-31 MED ORDER — LAMOTRIGINE 200 MG PO TABS: 200.0000 mg | ORAL_TABLET | Freq: Every day | ORAL | 0 refills | Status: DC

## 2022-08-31 MED ORDER — CITALOPRAM HYDROBROMIDE 10 MG PO TABS: 30.0000 mg | ORAL_TABLET | Freq: Every day | ORAL | 0 refills | Status: DC

## 2022-08-31 NOTE — Progress Notes (Signed)
Virtual Visit via Video Note  I connected with Patricia Rivas on 08/31/22 at  8:00 AM EST by a video enabled telemedicine application and verified that I am speaking with the correct person using two identifiers.  Location: Patient: home Provider: home office   I discussed the limitations of evaluation and management by telemedicine and the availability of in person appointments. The patient expressed understanding and agreed to proceed.   I discussed the assessment and treatment plan with the patient. The patient was provided an opportunity to ask questions and all were answered. The patient agreed with the plan and demonstrated an understanding of the instructions.   The patient was advised to call back or seek an in-person evaluation if the symptoms worsen or if the condition fails to improve as anticipated.  I provided 55 minutes of non-face-to-face time during this encounter.   Patricia Melter, LCSW   THERAPIST PROGRESS NOTE  Session Time: 8:00am-8:55am  Participation Level: Active  Behavioral Response: Well GroomedAlertDepressed and Euthymic  Type of Therapy: Individual Therapy  Treatment Goals addressed:  "Address my own feelings about my past behaviors and forgive myself so I can have better relationships with my child and husband".  Patricia Rivas will report improvement in her mood, behaviors, and her relationships 5 out of 7 days.     ProgressTowards Goals: Progressing  Interventions: CBT and Supportive  Summary: Patricia Rivas is a 41 y.o. female who presents with GAD and Bipolar II Disorder.   Suicidal/Homicidal: Nowithout intent/plan  Therapist Response: Patricia Rivas engaged well in individual session with clinician. Clinician utilized supportive counseling and CBT to process recent loss of friend. Clinician provided psychoeducation about models of grief, particular the Dual Processing model of grief. Clinician normalized feelings of fear, anger, sadness  specifically regarding the death of friend, as well as how the impact of the loss becomes reflected on Patricia Rivas's worries and fears of this happening in her own life. Clinician encouraged Patricia Rivas to share memories of her friend, laugh, cry, and find support in her friends. Clinician also identified the importance of feeling her feelings as they present.   Plan: Return again in 2 weeks.  Diagnosis: GAD (generalized anxiety disorder)  Bipolar II disorder ([name])  Collaboration of Care: Psychiatrist AEB reviewed recent note from Dr. Michae Kava  Patient/Guardian was advised Release of Information must be obtained prior to any record release in order to collaborate their care with an outside provider. Patient/Guardian was advised if they have not already done so to contact the registration department to sign all necessary forms in order for [name] to release information regarding their care.   Consent: Patient/Guardian gives verbal consent for treatment and assignment of benefits for services provided during this visit. Patient/Guardian expressed understanding and agreed to proceed.   Chryl Heck [place], LCSW 08/31/2022

## 2022-08-31 NOTE — Progress Notes (Signed)
Virtual Visit via Video Note  I connected with Patricia Rivas on 08/31/22 at  9:00 AM EST by a video enabled telemedicine application and verified that I am speaking with the correct person using two identifiers.  Location: Patient: home Provider: office   I discussed the limitations of evaluation and management by telemedicine and the availability of in person appointments. The patient expressed understanding and agreed to proceed.  History of Present Illness: "I'm doing good". Manasvini shares she has a lot of outside stressors going on right now. Work has been stressful. Her best friend recently died unexpectedly a few weeks ago. It has been hard. She is grieving but not depressed. She denies any mood swings and irritability. She is on her period now but is only mildly grumpy. [name] is waking up a couple of times a night due to her dog or something else. She is able to fall back asleep quickly. Trayonna finds she needs a break several days a week and has been taking a 1 hour nap that helps her to recharge and focus. Patricia Rivas denies manic or hypomanic like symptoms. She denies SI/HI. Kendall has noticed about 2-3 episodes of increased anxiety but most of the time her anxiety is manageable. The episodes lasted for about 10 minutes and she is not surprised with all that is happening. Akshaya has not had any cravings or relapse with alcohol or illicit drug use. Sania is working with her therapist and they have increased the frequency of visits. Sharice is utilizing her supports. She feels her meds are working well and does not want anything changed today.    Observations/Objective: Psychiatric Specialty Exam: ROS  unknown if currently breastfeeding.There is no height or weight on file to calculate BMI.  General Appearance: Casual and Neat  Eye Contact:  Good  Speech:  Clear and Coherent and Normal Rate  Volume:  Normal  Mood:  Euthymic  Affect:  Full Range  Thought Process:  Goal Directed,  Linear, and Descriptions of Associations: Intact  Orientation:  Full (Time, Place, and Person)  Thought Content:  Logical  Suicidal Thoughts:  No  Homicidal Thoughts:  No  Memory:  Immediate;   Good  Judgement:  Good  Insight:  Good  Psychomotor Activity:  Normal  Concentration:  Concentration: Good  Recall:  Good  Fund of Knowledge:  Good  Language:  Good  Akathisia:  No  Handed:  Right  AIMS (if indicated):     Assets:  Communication Skills Desire for Improvement Financial Resources/Insurance Housing Intimacy Leisure Time Resilience Social Support Talents/Skills Transportation Vocational/Educational  ADL's:  Intact  Cognition:  WNL  Sleep:        Assessment and Plan:     08/31/2022    9:15 AM 06/29/2022   10:06 AM 03/30/2022   11:09 AM 01/05/2022   10:38 AM 11/03/2021   10:35 AM  Depression screen PHQ 2/9  Decreased Interest 0 0 0 0 0  Down, Depressed, Hopeless 0 0 0 0 0  PHQ - 2 Score 0 0 0 0 0    Flowsheet Row Video Visit [facility] 08/31/2022 in [facility] Video Visit from 06/29/2022 in [facility] Video Visit from 03/30/2022 in [facility]  C-SSRS RISK CATEGORY No Risk No Risk No Risk         Pt is aware that these meds carry a teratogenic risk. Pt will discuss plan of action if she does or plans to become  pregnant in the future.  Status of current problems: Patricia Rivas is dealing with multiple stressors but reports stable mood. She is utilizing her supports, therapy and meds.   Meds:  1. Bipolar II disorder ([name]) - lamoTRIgine (LAMICTAL) 200 MG tablet; Take 1 tablet (200 mg total) by mouth daily.  Dispense: 90 tablet; Refill: 0  2. GAD (generalized anxiety disorder) - citalopram (CELEXA) 10 MG tablet; Take 3 tablets (30 mg total) by mouth daily.  Dispense: 270 tablet; Refill: 0 - gabapentin (NEURONTIN) 300 MG capsule; Take 1 capsule (300 mg total)  by mouth 4 (four) times daily.  Dispense: 360 capsule; Refill: 0  3. PMDD (premenstrual dysphoric disorder) - citalopram (CELEXA) 10 MG tablet; Take 3 tablets (30 mg total) by mouth daily.  Dispense: 270 tablet; Refill: 0 - lamoTRIgine (LAMICTAL) 200 MG tablet; Take 1 tablet (200 mg total) by mouth daily.  Dispense: 90 tablet; Refill: 0     Labs: none    Therapy: brief supportive therapy provided. Discussed psychosocial stressors in detail.    Collaboration of Care: Referral or follow-up with counselor/therapist AEB encouraged to continue visits with her therapist  Patient/Guardian was advised Release of Information must be obtained prior to any record release in order to collaborate their care with an outside provider. Patient/Guardian was advised if they have not already done so to contact the registration department to sign all necessary forms in order for [name] to release information regarding their care.   Consent: Patient/Guardian gives verbal consent for treatment and assignment of benefits for services provided during this visit. Patient/Guardian expressed understanding and agreed to proceed.     Follow Up Instructions: Follow up in 2-3 months or sooner if needed    I discussed the assessment and treatment plan with the patient. The patient was provided an opportunity to ask questions and all were answered. The patient agreed with the plan and demonstrated an understanding of the instructions.   The patient was advised to call back or seek an in-person evaluation if the symptoms worsen or if the condition fails to improve as anticipated.  I provided 14 minutes of non-face-to-face time during this encounter.   [name], MD

## 2022-09-12 ENCOUNTER — Ambulatory Visit (INDEPENDENT_AMBULATORY_CARE_PROVIDER_SITE_OTHER): Payer: 59 | Admitting: Licensed Clinical Social Worker

## 2022-09-12 DIAGNOSIS — F411 Generalized anxiety disorder: Secondary | ICD-10-CM

## 2022-09-12 DIAGNOSIS — F3181 Bipolar II disorder: Secondary | ICD-10-CM | POA: Diagnosis not present

## 2022-09-13 ENCOUNTER — Encounter (HOSPITAL_COMMUNITY): Payer: Self-pay | Admitting: Licensed Clinical Social Worker

## 2022-09-13 NOTE — Progress Notes (Signed)
Virtual Visit via Video Note  I connected with Patricia Rivas on 09/13/22 at 11:00 AM EST by a video enabled telemedicine application and verified that I am speaking with the correct person using two identifiers.  Location: Patient: home Provider: office   I discussed the limitations of evaluation and management by telemedicine and the availability of in person appointments. The patient expressed understanding and agreed to proceed.     I discussed the assessment and treatment plan with the patient. The patient was provided an opportunity to ask questions and all were answered. The patient agreed with the plan and demonstrated an understanding of the instructions.   The patient was advised to call back or seek an in-person evaluation if the symptoms worsen or if the condition fails to improve as anticipated.  I provided 55 minutes of non-face-to-face time during this encounter.   Patricia Melter, LCSW   THERAPIST PROGRESS NOTE  Session Time: 11:00am-11:55am  Participation Level: Active  Behavioral Response: Well GroomedAlertAngry and Depressed  Type of Therapy: Individual Therapy  Treatment Goals addressed: "Address my own feelings about my past behaviors and forgive myself so I can have better relationships with my child and husband".  Patricia Rivas will report improvement in her mood, behaviors, and her relationships 5 out of 7 days.    ProgressTowards Goals: Progressing  Interventions: CBT  Summary: Patricia Rivas is a 41 y.o. female who presents with GAD, Bipolar II.   Suicidal/Homicidal: Nowithout intent/plan  Therapist Response: Patricia Rivas engaged well in individual virtual session. Clinician utilized CBT to process updates from last session regarding recovery from grief of her friend who recently died. Clinician processed recent birth of friend's baby and their plans for Thanksgiving. Patricia Rivas shared recent issue with father and father's girlfriend. Clinician provided  time and space for Patricia Rivas to share the story of what happened last week and her major problem with father's girlfriend "stepping out of her lane". Clinician processed the reasons why this was so hurtful and angering to Patricia Rivas. Clinician validated feelings. Clinician also identified fear that father would take girlfriend's side, rather than Patricia Rivas's side. Clinician identified life long insecurity about her worth in her father's eyes due to abandonment in earlier childhood. Patricia Rivas shared lack of memories from ages [age]. Clinician noted that this was the key time for self-esteem development, which has been a significant challenge and issue for Patricia Rivas. Clinician reflected the importance that father has been trying to be a part of her family, and discussed ways Patricia Rivas can allow him to be a part without his girlfriend. Clinician encouraged Patricia Rivas to maintain her boundaries in order to provide emotional safety for herself and her family.   Plan: Return again in 2-3 weeks.  Diagnosis: GAD (generalized anxiety disorder)  Bipolar II disorder (Patricia Rivas)  Collaboration of Care: Psychiatrist AEB reviewed recent note from Dr. Michae Rivas  Patient/Guardian was advised Release of Information must be obtained prior to any record release in order to collaborate their care with an outside provider. Patient/Guardian was advised if they have not already done so to contact the registration department to sign all necessary forms in order for [name] to release information regarding their care.   Consent: Patient/Guardian gives verbal consent for treatment and assignment of benefits for services provided during this visit. Patient/Guardian expressed understanding and agreed to proceed.   Patricia Rivas [place], LCSW 09/13/2022

## 2022-10-03 ENCOUNTER — Ambulatory Visit (HOSPITAL_COMMUNITY): Payer: 59 | Admitting: Licensed Clinical Social Worker

## 2022-10-24 ENCOUNTER — Ambulatory Visit (INDEPENDENT_AMBULATORY_CARE_PROVIDER_SITE_OTHER): Payer: 59 | Admitting: Licensed Clinical Social Worker

## 2022-10-24 ENCOUNTER — Encounter (HOSPITAL_COMMUNITY): Payer: Self-pay | Admitting: Licensed Clinical Social Worker

## 2022-10-24 DIAGNOSIS — F411 Generalized anxiety disorder: Secondary | ICD-10-CM

## 2022-10-24 DIAGNOSIS — F3181 Bipolar II disorder: Secondary | ICD-10-CM

## 2022-10-24 NOTE — Progress Notes (Signed)
   THERAPIST PROGRESS NOTE  Session Time: 10:00am-11:00am  Participation Level: None  Behavioral Response: Well GroomedAlertDepressed  Type of Therapy: Individual Therapy  Treatment Goals addressed:  "Address my own feelings about my past behaviors and forgive myself so I can have better relationships with my child and husband".  Stormie will report improvement in her mood, behaviors, and her relationships 5 out of 7 days.     ProgressTowards Goals: Progressing  Interventions: Motivational Interviewing  Summary: Patricia Rivas is a 42 y.o. female who presents with GAD and Bipolar 2 Disorder.   Suicidal/Homicidal: Nowithout intent/plan  Therapist Response: Patricia Rivas engaged well in individual session. Clinician utilized MI OARS to reflect and summarize thoughts and feelings. Patricia Rivas processed through her experiences with her father over the holidays. She shared the decision to phase out of her relationship with father. Clinician processed thoughts and feelings. Clinician explored how history has shown that dad has not been attuned to her for most of her life, and that while he has been generous financially, he does not know her. Clinician explored options, as well as motivation to make changes in their relationship. Clinician also identified that these changes are not urgent, so she has some time to go through this from a more cognitive perspective. Clinician encouraged Patricia Rivas to write a script for this conversation.   Plan: Return again in 2 weeks.  Diagnosis: GAD (generalized anxiety disorder)  Bipolar II disorder ([place])  Collaboration of Care: Psychiatrist AEB reviewed last note from Dr. Doyne Keel  Patient/Guardian was advised Release of Information must be obtained prior to any record release in order to collaborate their care with an outside provider. Patient/Guardian was advised if they have not already done so to contact the registration department to sign all necessary forms in  order for [name] to release information regarding their care.   Consent: Patient/Guardian gives verbal consent for treatment and assignment of benefits for services provided during this visit. Patient/Guardian expressed understanding and agreed to proceed.   [place], LCSW 10/24/2022

## 2022-11-07 ENCOUNTER — Ambulatory Visit (HOSPITAL_COMMUNITY): Payer: 59 | Admitting: Licensed Clinical Social Worker

## 2022-11-21 ENCOUNTER — Ambulatory Visit (HOSPITAL_COMMUNITY): Payer: 59 | Admitting: Licensed Clinical Social Worker

## 2022-11-23 ENCOUNTER — Telehealth (HOSPITAL_BASED_OUTPATIENT_CLINIC_OR_DEPARTMENT_OTHER): Payer: 59 | Admitting: Psychiatry

## 2022-11-23 DIAGNOSIS — F3181 Bipolar II disorder: Secondary | ICD-10-CM

## 2022-11-23 DIAGNOSIS — F411 Generalized anxiety disorder: Secondary | ICD-10-CM

## 2022-11-23 DIAGNOSIS — F3281 Premenstrual dysphoric disorder: Secondary | ICD-10-CM | POA: Diagnosis not present

## 2022-11-23 MED ORDER — GABAPENTIN 300 MG PO CAPS: 300.0000 mg | ORAL_CAPSULE | Freq: Four times a day (QID) | ORAL | 0 refills | Status: DC

## 2022-11-23 MED ORDER — CITALOPRAM HYDROBROMIDE 10 MG PO TABS: 30.0000 mg | ORAL_TABLET | Freq: Every day | ORAL | 0 refills | Status: DC

## 2022-11-23 MED ORDER — LAMOTRIGINE 200 MG PO TABS: 200.0000 mg | ORAL_TABLET | Freq: Every day | ORAL | 0 refills | Status: DC

## 2022-11-23 NOTE — Progress Notes (Signed)
Virtual Visit via Video Note  I connected with Patricia Rivas on 11/23/22 at  4:00 PM EST by  a video enabled telemedicine application and verified that I am speaking with the correct person using two identifiers.  Location: Patient: Home Provider: office   I discussed the limitations of evaluation and management by telemedicine and the availability of in person appointments. The patient expressed understanding and agreed to proceed.  History of Present Illness: "I'm actually doing pretty good". Patricia Rivas is having a very busy work day. She is working on fixing something at work and it is irritating. Home life has been good. Situational stress related to her dad has improved. Her anxiety has decreased and is mostly situational. She carries her anxiety in her shoulders. Last week she was taking care of her mother after mom had back surgery. The irritability around her menstrual cycle seems to be very low level.  Patricia Rivas denies depression and anhedonia. She denies SI/HI. She denies manic and hypomanic like symptoms. Patricia Rivas is sleeping and eating well. Her meds seem to be effective and denies SE.    Observations/Objective: Psychiatric Specialty Exam: ROS  unknown if currently breastfeeding.There is no height or weight on file to calculate BMI.  General Appearance: Neat and Well Groomed  Eye Contact:  Good  Speech:  Clear and Coherent and Normal Rate  Volume:  Normal  Mood:  Euthymic  Affect:  Full Range  Thought Process:  Goal Directed, Linear, and Descriptions of Associations: Intact  Orientation:  Full (Time, Place, and Person)  Thought Content:  Logical  Suicidal Thoughts:  No  Homicidal Thoughts:  No  Memory:  Immediate;   Good  Judgement:  Good  Insight:  Good  Psychomotor Activity:  Normal  Concentration:  Concentration: Good  Recall:  Good  Fund of Knowledge:  Good  Language:  Good  Akathisia:  No  Handed:  Right  AIMS (if indicated):     Assets:  Communication  Skills Desire for Improvement Financial Resources/Insurance Housing Intimacy Leisure Time Resilience Social Support Talents/Skills Transportation Vocational/Educational  ADL's:  Intact  Cognition:  WNL  Sleep:        Assessment and Plan:     11/23/2022    4:36 PM 08/31/2022    9:15 AM 06/29/2022   10:06 AM 03/30/2022   11:09 AM 01/05/2022   10:38 AM  Depression screen PHQ 2/9  Decreased Interest 0 0 0 0 0  Down, Depressed, Hopeless 0 0 0 0 0  PHQ - 2 Score 0 0 0 0 0    Flowsheet Row Video Visit from 11/23/2022 in [facility] Video Visit from 08/31/2022 in [facility] Video Visit from 06/29/2022 in [facility] No Risk No Risk No Risk          Pt is aware that these meds carry a teratogenic risk. Pt will discuss plan of action if she does or plans to become pregnant in the future.  Status of current problems: stable   Medication management with supportive therapy. Risks and benefits, side effects and alternative treatment options discussed with patient. Pt was given an opportunity to ask questions about medication, illness, and treatment. All current psychiatric medications have been reviewed and discussed with the patient and adjusted as clinically appropriate.  Pt verbalized understanding and verbal consent obtained for treatment.  Meds:  1. GAD (generalized anxiety disorder) - citalopram (CELEXA) 10 MG tablet; Take 3 tablets (  30 mg total) by mouth daily.  Dispense: 270 tablet; Refill: 0 - gabapentin (NEURONTIN) 300 MG capsule; Take 1 capsule (300 mg total) by mouth 4 (four) times daily.  Dispense: 360 capsule; Refill: 0  2. PMDD (premenstrual dysphoric disorder) - citalopram (CELEXA) 10 MG tablet; Take 3 tablets (30 mg total) by mouth daily.  Dispense: 270 tablet; Refill: 0 - lamoTRIgine (LAMICTAL) 200 MG tablet; Take 1 tablet (200 mg  total) by mouth daily.  Dispense: 90 tablet; Refill: 0  3. Bipolar II disorder ([name]) - lamoTRIgine (LAMICTAL) 200 MG tablet; Take 1 tablet (200 mg total) by mouth daily.  Dispense: 90 tablet; Refill: 0          Therapy: brief supportive therapy provided. Discussed psychosocial stressors in detail.     Collaboration of Care: Other none  Patient/Guardian was advised Release of Information must be obtained prior to any record release in order to collaborate their care with an outside provider. Patient/Guardian was advised if they have not already done so to contact the registration department to sign all necessary forms in order for [name] to release information regarding their care.   Consent: Patient/Guardian gives verbal consent for treatment and assignment of benefits for services provided during this visit. Patient/Guardian expressed understanding and agreed to proceed.       Follow Up Instructions: Follow up in 3 months or sooner if needed    I discussed the assessment and treatment plan with the patient. The patient was provided an opportunity to ask questions and all were answered. The patient agreed with the plan and demonstrated an understanding of the instructions.   The patient was advised to call back or seek an in-person evaluation if the symptoms worsen or if the condition fails to improve as anticipated.  I provided 8 minutes of non-face-to-face time during this encounter.   Charlcie Cradle, MD

## 2023-02-15 ENCOUNTER — Telehealth (HOSPITAL_BASED_OUTPATIENT_CLINIC_OR_DEPARTMENT_OTHER): Payer: 59 | Admitting: Psychiatry

## 2023-02-15 DIAGNOSIS — F411 Generalized anxiety disorder: Secondary | ICD-10-CM

## 2023-02-15 DIAGNOSIS — F3181 Bipolar II disorder: Secondary | ICD-10-CM

## 2023-02-15 DIAGNOSIS — F3281 Premenstrual dysphoric disorder: Secondary | ICD-10-CM

## 2023-02-15 MED ORDER — BUPROPION HCL ER (XL) 150 MG PO TB24: 150.0000 mg | ORAL_TABLET | Freq: Every day | ORAL | 0 refills | Status: DC

## 2023-02-15 MED ORDER — LAMOTRIGINE 200 MG PO TABS: 200.0000 mg | ORAL_TABLET | Freq: Every day | ORAL | 0 refills | Status: DC

## 2023-02-15 MED ORDER — GABAPENTIN 300 MG PO CAPS: 300.0000 mg | ORAL_CAPSULE | Freq: Four times a day (QID) | ORAL | 0 refills | Status: DC

## 2023-02-15 NOTE — Progress Notes (Signed)
Virtual Visit via Video Note  I connected with Patricia Rivas on 02/15/23 at  8:30 AM EDT by a video enabled telemedicine application and verified that I am speaking with the correct person using two identifiers.  Location: Patient: home Provider: office   I discussed the limitations of evaluation and management by telemedicine and the availability of in person appointments. The patient expressed understanding and agreed to proceed.  History of Present Illness: Patricia Rivas shares that one of her best friends died in August 01, 2023. She was depressed for a little while after that. Her husband's best friend died after an accidental drug OD in the middle of February. Patricia Rivas. February was a very hard month for them. Her husband seemed depressed due to the loss of his childhood friend. Patricia Rivas and her family are doing better lately. Her biggest concern is her lack of her libido. It is not a new problem but it is starting to become a problem. She doesn't know what do. It is not due to her relationship with her husband and thinks it due to medications. She denies depression, anhedonia and hopelessness. She denies SI/HI. Pt denies recent manic and hypomanic symptoms including periods of decreased need for sleep, increased energy, mood lability, impulsivity, FOI, and excessive spending. Her irritability comes on for about 2 days with her period but is manageable. She is sleep and eating well. Her anxiety is mostly situational lately. When it occurs it is manageable. Sophiah shares that her meds are effective and the only decreased sex drive.    Observations/Objective: Psychiatric Specialty Exam: ROS  unknown if currently breastfeeding.There is no height or weight on file to calculate BMI.  General Appearance: Fairly Groomed and Neat  Eye Contact:  Good  Speech:  Clear and Coherent and Normal Rate  Volume:  Normal  Mood:  Euthymic  Affect:  Full Range  Thought Process:  Goal  Directed, Linear, and Descriptions of Associations: Intact  Orientation:  Full (Time, Place, and Person)  Thought Content:  WDL  Suicidal Thoughts:  No  Homicidal Thoughts:  No  Memory:  Immediate;   Good  Judgement:  Good  Insight:  Good  Psychomotor Activity:  Normal  Concentration:  Concentration: Good  Recall:  Good  Fund of Knowledge:  Good  Language:  Good  Akathisia:  No  Handed:  Right  AIMS (if indicated):     Assets:  Communication Skills Desire for Improvement Financial Resources/Insurance Housing Intimacy Leisure Time Resilience Social Support Talents/Skills Transportation Vocational/Educational  ADL's:  Intact  Cognition:  WNL  Sleep:        Assessment and Plan:     02/15/2023    8:40 AM 11/23/2022    4:36 PM 08/31/2022    9:15 AM 06/29/2022   10:06 AM 03/30/2022   11:09 AM  Depression screen PHQ 2/9  Decreased Interest 0 0 0 0 0  Down, Depressed, Hopeless 0 0 0 0 0  PHQ - 2 Score 0 0 0 0 0    Flowsheet Row Video Visit [facility] 02/15/2023 in [facility] Video Visit from 11/23/2022 in [facility] Video Visit from 08/31/2022 in [facility]  C-SSRS RISK CATEGORY No Risk No Risk No Risk          Pt is aware that these meds carry a teratogenic risk. Pt will discuss plan of action if she does or plans to become pregnant in the future.  Status of current problems: poor sex drive. Mood and anxiety are stable.    Medication management with supportive therapy. Risks and benefits, side effects and alternative treatment options discussed with patient. Pt was given an opportunity to ask questions about medication, illness, and treatment. All current psychiatric medications have been reviewed and discussed with the patient and adjusted as clinically appropriate.  Pt verbalized understanding and verbal consent obtained for treatment.  Meds: d/c Celexa due  to sexual SE Start trial of Wellbutrin XL  po qD  She will consider discussing it with her gynecologist.  1. GAD (generalized anxiety disorder) - gabapentin (NEURONTIN) 300 MG capsule; Take 1 capsule (300 mg total) by mouth 4 (four) times daily.  Dispense: 360 capsule; Refill: 0  2. Bipolar II disorder - lamoTRIgine (LAMICTAL) 200 MG tablet; Take 1 tablet (200 mg total) by mouth daily.  Dispense: 90 tablet; Refill: 0 - buPROPion (WELLBUTRIN XL) 150 MG 24 hr tablet; Take 1 tablet (150 mg total) by mouth daily.  Dispense: 90 tablet; Refill: 0  3. PMDD (premenstrual dysphoric disorder) - lamoTRIgine (LAMICTAL) 200 MG tablet; Take 1 tablet (200 mg total) by mouth daily.  Dispense: 90 tablet; Refill: 0     Labs: none    Therapy: brief supportive therapy provided. Discussed psychosocial stressors in detail.      Collaboration of Care: Other none today  Patient/Guardian was advised Release of Information must be obtained prior to any record release in order to collaborate their care with an outside provider. Patient/Guardian was advised if they have not already done so to contact the registration department to sign all necessary forms in order for [name] to release information regarding their care.   Consent: Patient/Guardian gives verbal consent for treatment and assignment of benefits for services provided during this visit. Patient/Guardian expressed understanding and agreed to proceed.      Follow Up Instructions: Follow up in 2 weeks or sooner if needed. Patient informed that I am leaving [facility] in 04/2023 and I relayed that they will be getting a new provider after that. Patient verbalized understanding and agreed with the plan.     I discussed the assessment and treatment plan with the patient. The patient was provided an opportunity to ask questions and all were answered. The patient agreed with the plan and demonstrated an understanding of the instructions.   The patient was  advised to call back or seek an in-person evaluation if the symptoms worsen or if the condition fails to improve as anticipated.  I provided 19 minutes of non-face-to-face time during this encounter.   [name], MD

## 2023-02-26 ENCOUNTER — Encounter (HOSPITAL_COMMUNITY): Payer: Self-pay

## 2023-03-01 ENCOUNTER — Telehealth (HOSPITAL_BASED_OUTPATIENT_CLINIC_OR_DEPARTMENT_OTHER): Payer: 59 | Admitting: Psychiatry

## 2023-03-01 DIAGNOSIS — F3181 Bipolar II disorder: Secondary | ICD-10-CM

## 2023-03-01 DIAGNOSIS — F3281 Premenstrual dysphoric disorder: Secondary | ICD-10-CM

## 2023-03-01 DIAGNOSIS — F411 Generalized anxiety disorder: Secondary | ICD-10-CM | POA: Diagnosis not present

## 2023-03-01 MED ORDER — CITALOPRAM HYDROBROMIDE 10 MG PO TABS: 10.0000 mg | ORAL_TABLET | Freq: Every day | ORAL | 0 refills | Status: DC

## 2023-03-01 NOTE — Progress Notes (Signed)
Virtual Visit via Video Note  I connected with Patricia Rivas on 03/01/23 at  2:00 PM EDT by a video enabled telemedicine application and verified that I am speaking with the correct person using two identifiers.  Location: Patient: home Provider: office   I discussed the limitations of evaluation and management by telemedicine and the availability of in person appointments. The patient expressed understanding and agreed to proceed.  History of Present Illness: Patricia Rivas and I last met 2 weeks ago. I discontinued Celexa and started Wellbutrin due to low libido. Today Patricia Rivas shares this weekend was rough due to increased anxiety and irritability. She expected it but it was still hard to deal with. It was manageable but not fun to deal with. This week her anxiety and irritability have decreased. She is having lightheadedness, vertigo, eye twitching and palpitations. Patricia Rivas has not checked her BP and does not have a BP cuff at home. It seemed to be getting better until yesterday. She thinks the bad weather today is contributing to the symptoms. She denies falls. Her sleep and energy are fine. She denies SI/HI. Patricia Rivas is able to work and do things that need to be done around the house. She has some brain zaps that cause twitching her eyes and body. She denies any manic or hypomanic symptoms or episodes.     Observations/Objective: Psychiatric Specialty Exam: ROS  unknown if currently breastfeeding.There is no height or weight on file to calculate BMI.  General Appearance: Casual and Fairly Groomed  Eye Contact:  Good  Speech:  Clear and Coherent and Normal Rate  Volume:  Normal  Mood:  Euthymic  Affect:  Full Range  Thought Process:  Goal Directed, Linear, and Descriptions of Associations: Intact  Orientation:  Full (Time, Place, and Person)  Thought Content:  Logical  Suicidal Thoughts:  No  Homicidal Thoughts:  No  Memory:  Immediate;   Good  Judgement:  Good  Insight:  Good   Psychomotor Activity:  Normal  Concentration:  Concentration: Good  Recall:  Good  Fund of Knowledge:  Good  Language:  Good  Akathisia:  No  Handed:  Right  AIMS (if indicated):     Assets:  Communication Skills Desire for Improvement Financial Resources/Insurance Housing Intimacy Leisure Time Resilience Social Support Talents/Skills Transportation Vocational/Educational  ADL's:  Intact  Cognition:  WNL  Sleep:        Assessment and Plan:     02/15/2023    8:40 AM 11/23/2022    4:36 PM 08/31/2022    9:15 AM 06/29/2022   10:06 AM 03/30/2022   11:09 AM  Depression screen PHQ 2/9  Decreased Interest 0 0 0 0 0  Down, Depressed, Hopeless 0 0 0 0 0  PHQ - 2 Score 0 0 0 0 0    Flowsheet Row Video Visit from 02/15/2023 in [facility] Video Visit from 11/23/2022 in [facility] Video Visit from 08[facility]31/2022 in [facility]  C-SSRS RISK CATEGORY No Risk No Risk No Risk          Pt is aware that these meds carry a teratogenic risk. Pt will discuss plan of action if she does or plans to become pregnant in the future.  Status of current problems: Patricia Rivas is having some side effects from Wellbutrin. It is possible she is also experiencing some withdrawal from Celexa, although it is not a common phenomenon.     Medication management with supportive therapy.  Risks and benefits, side effects and alternative treatment options discussed with patient. Pt was given an opportunity to ask questions about medication, illness, and treatment. All current psychiatric medications have been reviewed and discussed with the patient and adjusted as clinically appropriate.  Pt verbalized understanding and verbal consent obtained for treatment.  Meds: restart Celexa 10mg  po qD If SE from Wellbutrin worsen then she will d/c it and let me know 1. Bipolar II disorder ([name]) - citalopram  (CELEXA) 10 MG tablet; Take 1 tablet (10 mg total) by mouth daily.  Dispense: 1 tablet; Refill: 0  2. PMDD (premenstrual dysphoric disorder) - citalopram (CELEXA) 10 MG tablet; Take 1 tablet (10 mg total) by mouth daily.  Dispense: 1 tablet; Refill: 0  3. GAD (generalized anxiety disorder) - citalopram (CELEXA) 10 MG tablet; Take 1 tablet (10 mg total) by mouth daily.  Dispense: 1 tablet; Refill: 0     Labs: none    Therapy: brief supportive therapy provided. Discussed psychosocial stressors in detail.     Collaboration of Care: Other none  Patient/Guardian was advised Release of Information must be obtained prior to any record release in order to collaborate their care with an outside provider. Patient/Guardian was advised if they have not already done so to contact the registration department to sign all necessary forms in order for [name] to release information regarding their care.   Consent: Patient/Guardian gives verbal consent for treatment and assignment of benefits for services provided during this visit. Patient/Guardian expressed understanding and agreed to proceed.      Follow Up Instructions: Follow up in 1 months or sooner if needed  Patient informed that I am leaving [facility] in 04/2023 and I relayed that they will be getting a new provider after that. Patient verbalized understanding and agreed with the plan.     I discussed the assessment and treatment plan with the patient. The patient was provided an opportunity to ask questions and all were answered. The patient agreed with the plan and demonstrated an understanding of the instructions.   The patient was advised to call back or seek an in-person evaluation if the symptoms worsen or if the condition fails to improve as anticipated.  I provided 14 minutes of non-face-to-face time during this encounter.   [name], MD

## 2023-03-18 ENCOUNTER — Other Ambulatory Visit (HOSPITAL_COMMUNITY): Payer: Self-pay | Admitting: Psychiatry

## 2023-03-18 DIAGNOSIS — F411 Generalized anxiety disorder: Secondary | ICD-10-CM

## 2023-03-18 DIAGNOSIS — F3281 Premenstrual dysphoric disorder: Secondary | ICD-10-CM

## 2023-03-18 DIAGNOSIS — F3181 Bipolar II disorder: Secondary | ICD-10-CM

## 2023-03-29 ENCOUNTER — Telehealth (HOSPITAL_BASED_OUTPATIENT_CLINIC_OR_DEPARTMENT_OTHER): Payer: 59 | Admitting: Psychiatry

## 2023-03-29 DIAGNOSIS — F3181 Bipolar II disorder: Secondary | ICD-10-CM

## 2023-03-29 DIAGNOSIS — F411 Generalized anxiety disorder: Secondary | ICD-10-CM

## 2023-03-29 DIAGNOSIS — F3281 Premenstrual dysphoric disorder: Secondary | ICD-10-CM

## 2023-03-29 MED ORDER — LAMOTRIGINE 200 MG PO TABS: 200.0000 mg | ORAL_TABLET | Freq: Every day | ORAL | 0 refills | Status: DC

## 2023-03-29 MED ORDER — BUPROPION HCL ER (XL) 150 MG PO TB24: 150.0000 mg | ORAL_TABLET | Freq: Every day | ORAL | 0 refills | Status: DC

## 2023-03-29 MED ORDER — CITALOPRAM HYDROBROMIDE 10 MG PO TABS: 10.0000 mg | ORAL_TABLET | Freq: Every day | ORAL | 0 refills | Status: DC

## 2023-03-29 MED ORDER — GABAPENTIN 300 MG PO CAPS: 300.0000 mg | ORAL_CAPSULE | Freq: Four times a day (QID) | ORAL | 0 refills | Status: DC

## 2023-03-29 NOTE — Progress Notes (Signed)
Virtual Visit via Video Note  I connected with Patricia Rivas on 03/29/23 at  1:00 PM EDT by a video enabled telemedicine application and verified that I am speaking with the correct person using two identifiers.  Location: Patient: home Provider: office   I discussed the limitations of evaluation and management by telemedicine and the availability of in person appointments. The patient expressed understanding and agreed to proceed.  History of Present Illness: We last met about 1 month ago. Patricia Rivas is feeling a lot better. She thinks she might have been depressed and not realized it. The last couple of weeks her mood has improved. She denies anhedonia and hopelessness. Her sleep is good and she gets about 8 hrs/night. She has more energy. She denies anxiety or jittery. If it happens she states the anxiety is mild and manageable. She denies feels numb. Patricia Rivas denies any manic or hypomanic like symptoms or episodes in many months. The irritability around her periods is more manageable. Patricia Rivas denies SI/HI. She is tolerating the Wellbutrin and denies SE. Her libido is slowly improving.     Observations/Objective: Psychiatric Specialty Exam: ROS  unknown if currently breastfeeding.There is no height or weight on file to calculate BMI.  General Appearance: Casual  Eye Contact:  Good  Speech:  Clear and Coherent and Normal Rate  Volume:  Normal  Mood:  Euthymic  Affect:  Full Range  Thought Process:  Goal Directed, Linear, and Descriptions of Associations: Intact  Orientation:  Full (Time, Place, and Person)  Thought Content:  Logical  Suicidal Thoughts:  No  Homicidal Thoughts:  No  Memory:  Immediate;   Good  Judgement:  Good  Insight:  Good  Psychomotor Activity:  Normal  Concentration:  Concentration: Good  Recall:  Good  Fund of Knowledge:  Good  Language:  Good  Akathisia:  No  Handed:  Right  AIMS (if indicated):     Assets:  Communication Skills Desire for  Improvement Financial Resources/Insurance Housing Intimacy Leisure Time Resilience Social Support Talents/Skills Transportation Vocational/Educational  ADL's:  Intact  Cognition:  WNL  Sleep:        Assessment and Plan:     03/29/2023    1:07 PM 02/15/2023    8:40 AM 11/23/2022    4:36 PM 08/31/2022    9:15 AM 06/29/2022   10:06 AM  Depression screen PHQ 2/9  Decreased Interest 0 0 0 0 0  Down, Depressed, Hopeless 1 0 0 0 0  PHQ - 2 Score 1 0 0 0 0    Flowsheet Row Video Visit from 03/29/2023 in [facility] Video Visit from 02/15/2023 in [facility] Video Visit from 11/23/2022 in [facility]  C-SSRS RISK CATEGORY No Risk No Risk No Risk          Pt is aware that these meds carry a teratogenic risk. Pt will discuss plan of action if she does or plans to become pregnant in the future.  Status of current problems: improved mood, anxiety and libido   Medication management with supportive therapy. Risks and benefits, side effects and alternative treatment options discussed with patient. Pt was given an opportunity to ask questions about medication, illness, and treatment. All current psychiatric medications have been reviewed and discussed with the patient and adjusted as clinically appropriate.  Pt verbalized understanding and verbal consent obtained for treatment.  Meds:  1. Bipolar II disorder ([name]) - buPROPion (WELLBUTRIN XL) 150 MG 24  hr tablet; Take 1 tablet (150 mg total) by mouth daily.  Dispense: 90 tablet; Refill: 0 - lamoTRIgine (LAMICTAL) 200 MG tablet; Take 1 tablet (200 mg total) by mouth daily.  Dispense: 90 tablet; Refill: 0 - citalopram (CELEXA) 10 MG tablet; Take 1 tablet (10 mg total) by mouth daily.  Dispense: 90 tablet; Refill: 0  2. GAD (generalized anxiety disorder) - gabapentin (NEURONTIN) 300 MG capsule; Take 1 capsule (300 mg total) by  mouth 4 (four) times daily.  Dispense: 360 capsule; Refill: 0 - citalopram (CELEXA) 10 MG tablet; Take 1 tablet (10 mg total) by mouth daily.  Dispense: 90 tablet; Refill: 0  3. PMDD (premenstrual dysphoric disorder) - lamoTRIgine (LAMICTAL) 200 MG tablet; Take 1 tablet (200 mg total) by mouth daily.  Dispense: 90 tablet; Refill: 0 - citalopram (CELEXA) 10 MG tablet; Take 1 tablet (10 mg total) by mouth daily.  Dispense: 90 tablet; Refill: 0     Labs: none    Therapy: brief supportive therapy provided. Discussed psychosocial stressors in detail.    Collaboration of Care: Other none  Patient/Guardian was advised Release of Information must be obtained prior to any record release in order to collaborate their care with an outside provider. Patient/Guardian was advised if they have not already done so to contact the registration department to sign all necessary forms in order for [name] to release information regarding their care.   Consent: Patient/Guardian gives verbal consent for treatment and assignment of benefits for services provided during this visit. Patient/Guardian expressed understanding and agreed to proceed.      Follow Up Instructions: Follow up in 3-4 months or sooner if needed with a new psychiatrist  Patient informed that I am leaving [facility] in 04/2023 and I relayed that they will be getting a new provider after that. Patient verbalized understanding and agreed with the plan.     I discussed the assessment and treatment plan with the patient. The patient was provided an opportunity to ask questions and all were answered. The patient agreed with the plan and demonstrated an understanding of the instructions.   The patient was advised to call back or seek an in-person evaluation if the symptoms worsen or if the condition fails to improve as anticipated.  I provided 12 minutes of non-face-to-face time during this encounter.   [name], MD

## 2023-04-02 ENCOUNTER — Telehealth (HOSPITAL_COMMUNITY): Payer: Self-pay | Admitting: Psychiatry

## 2023-04-20 ENCOUNTER — Institutional Professional Consult (permissible substitution): Payer: Managed Care, Other (non HMO) | Admitting: Plastic Surgery

## 2023-04-24 ENCOUNTER — Ambulatory Visit (INDEPENDENT_AMBULATORY_CARE_PROVIDER_SITE_OTHER): Payer: 59 | Admitting: Licensed Clinical Social Worker

## 2023-04-24 DIAGNOSIS — F3181 Bipolar II disorder: Secondary | ICD-10-CM | POA: Diagnosis not present

## 2023-04-24 DIAGNOSIS — F411 Generalized anxiety disorder: Secondary | ICD-10-CM

## 2023-05-08 ENCOUNTER — Encounter (HOSPITAL_COMMUNITY): Payer: Self-pay | Admitting: Licensed Clinical Social Worker

## 2023-05-08 NOTE — Progress Notes (Signed)
   THERAPIST PROGRESS NOTE  Session Time: 8:00am-8:55am  Participation Level: Active  Behavioral Response: Well GroomedAlertDepressed  Type of Therapy: Individual Therapy  Treatment Goals addressed: "Address my own feelings about my past behaviors and forgive myself so I can have better relationships with my child and husband".  [name] will report improvement in her mood, behaviors, and her relationships 5 out of 7 days.     ProgressTowards Goals: Progressing  Interventions: CBT  Summary: Patricia Rivas is a 42 y.o. female who presents with GAD and Bipolar II.   Suicidal/Homicidal: Nowithout intent/plan  Therapist Response: Mirinda engaged well in individual session with clinician. Clinician utilized CBT to process updates in life, family, and relationships. Clinician provided psychoeducation about emotions and increased attention to depressive sxs rather than mania. Clinician noted that isolation, increase in time in her bedroom, and reduction in social interactions are sxs of depressed mood. Clinician explored meds and noted improvement since changing to Wellbutrin. Clinician discussed coping skills and identified that journaling, maintaining relationships with sponsor in AA, and focusing on her family have been helpful.   Plan: Return again in 2 weeks.  Diagnosis: GAD (generalized anxiety disorder)  Bipolar II disorder ([name])  Collaboration of Care: Psychiatrist AEB reviewed last note from Dr. Michae Kava  Patient/Guardian was advised Release of Information must be obtained prior to any record release in order to collaborate their care with an outside provider. Patient/Guardian was advised if they have not already done so to contact the registration department to sign all necessary forms in order for [name] to release information regarding their care.   Consent: Patient/Guardian gives verbal consent for treatment and assignment of benefits for services provided during this visit.  Patient/Guardian expressed understanding and agreed to proceed.   Chryl Heck [place], LCSW 05/08/2023

## 2023-05-15 ENCOUNTER — Ambulatory Visit (HOSPITAL_COMMUNITY): Payer: 59 | Admitting: Licensed Clinical Social Worker

## 2023-05-15 DIAGNOSIS — F3181 Bipolar II disorder: Secondary | ICD-10-CM | POA: Diagnosis not present

## 2023-05-15 DIAGNOSIS — F411 Generalized anxiety disorder: Secondary | ICD-10-CM | POA: Diagnosis not present

## 2023-05-16 ENCOUNTER — Encounter (HOSPITAL_COMMUNITY): Payer: Self-pay | Admitting: Licensed Clinical Social Worker

## 2023-05-16 NOTE — Progress Notes (Signed)
   THERAPIST PROGRESS NOTE  Session Time: 9:00am-9:55am  Participation Level: Active  Behavioral Response: Well GroomedAlertEuthymic  Type of Therapy: Individual Therapy  Treatment Goals addressed: "Address my own feelings about my past behaviors and forgive myself so I can have better relationships with my child and husband".  Patricia Rivas will report improvement in her mood, behaviors, and her relationships 5 out of 7 days.     ProgressTowards Goals: Progressing  Interventions: CBT  Summary: Patricia Rivas is a 42 y.o. female who presents with GAD and Bipolar II.   Suicidal/Homicidal: Nowithout intent/plan  Therapist Response: Makaylie engaged well in session with clinician. Clinician utilized CBT to process thoughts, feelings, and interactions. Clinician explored relationship with husband and daughter. Clinician identified increased stress between husband and daughter due to husband's grief, daughter's puberty, and husband's stress over having his little girl growing into a woman. Clinician identified the importance of being a part of husband's wellness team, and also being able to serve as interpreter between husband and daughter. Clinician noted the challenges of having an adolescent daughter and encouraged [name] to remember back to her days as a 19-54 year old and the differences she can make in her daughter's experience than she had in her own. [name] shared that she knew too much at that age, which led to some poor choices and bad behaviors in her teens. Clinician noted the different choices she is making as a mother: being in a communicative relationship, no substances, and a good routine.   Plan: Return again in 2 weeks. Notified [name] about grief groups and counseling for husband at [facility]  Diagnosis: GAD (generalized anxiety disorder)  Bipolar II disorder ([name])  Collaboration of Care: Patient refused AEB none required  Patient/Guardian was advised Release of  Information must be obtained prior to any record release in order to collaborate their care with an outside provider. Patient/Guardian was advised if they have not already done so to contact the registration department to sign all necessary forms in order for [name] to release information regarding their care.   Consent: Patient/Guardian gives verbal consent for treatment and assignment of benefits for services provided during this visit. Patient/Guardian expressed understanding and agreed to proceed.   Chryl Heck [place], LCSW 05/16/2023

## 2023-06-05 ENCOUNTER — Ambulatory Visit (HOSPITAL_COMMUNITY): Payer: 59 | Admitting: Licensed Clinical Social Worker

## 2023-06-05 DIAGNOSIS — F411 Generalized anxiety disorder: Secondary | ICD-10-CM | POA: Diagnosis not present

## 2023-06-05 DIAGNOSIS — F3181 Bipolar II disorder: Secondary | ICD-10-CM

## 2023-06-11 ENCOUNTER — Other Ambulatory Visit (HOSPITAL_COMMUNITY): Payer: Self-pay

## 2023-06-11 DIAGNOSIS — F3281 Premenstrual dysphoric disorder: Secondary | ICD-10-CM

## 2023-06-11 DIAGNOSIS — F411 Generalized anxiety disorder: Secondary | ICD-10-CM

## 2023-06-11 DIAGNOSIS — F3181 Bipolar II disorder: Secondary | ICD-10-CM

## 2023-06-11 MED ORDER — LAMOTRIGINE 200 MG PO TABS: 200.0000 mg | ORAL_TABLET | Freq: Every day | ORAL | 0 refills | Status: DC

## 2023-06-11 MED ORDER — BUPROPION HCL ER (XL) 150 MG PO TB24: 150.0000 mg | ORAL_TABLET | Freq: Every day | ORAL | 0 refills | Status: DC

## 2023-06-11 MED ORDER — GABAPENTIN 300 MG PO CAPS: 300.0000 mg | ORAL_CAPSULE | Freq: Four times a day (QID) | ORAL | 0 refills | Status: DC

## 2023-06-11 MED ORDER — CITALOPRAM HYDROBROMIDE 10 MG PO TABS: 10.0000 mg | ORAL_TABLET | Freq: Every day | ORAL | 0 refills | Status: DC

## 2023-06-19 ENCOUNTER — Encounter (HOSPITAL_COMMUNITY): Payer: Self-pay | Admitting: Licensed Clinical Social Worker

## 2023-06-19 NOTE — Progress Notes (Signed)
Virtual Visit via Video Note  I connected with Patricia Rivas on 06/19/23 at  9:00 AM EDT by a video enabled telemedicine application and verified that I am speaking with the correct person using two identifiers.  Location: Patient: home Provider: office   I discussed the limitations of evaluation and management by telemedicine and the availability of in person appointments. The patient expressed understanding and agreed to proceed.     I discussed the assessment and treatment plan with the patient. The patient was provided an opportunity to ask questions and all were answered. The patient agreed with the plan and demonstrated an understanding of the instructions.   The patient was advised to call back or seek an in-person evaluation if the symptoms worsen or if the condition fails to improve as anticipated.  I provided 55 minutes of non-face-to-face time during this encounter.   [name], LCSW   THERAPIST PROGRESS NOTE  Session Time: 9:00am-9:55am  Participation Level: Active  Behavioral Response: NeatAlertDepressed  Type of Therapy: Individual Therapy  Treatment Goals addressed: "Address my own feelings about my past behaviors and forgive myself so I can have better relationships with my child and husband".  Patricia Rivas will report improvement in her mood, behaviors, and her relationships 5 out of 7 days.    ProgressTowards Goals: Progressing  Interventions: CBT  Summary: Patricia Rivas is a 42 y.o. female who presents with GAD and Bipolar II.    Suicidal/Homicidal: Nowithout intent/plan  Therapist Response: Diannie engaged well in individual virtual session with clinician. Clinician utilized CBT to process thoughts, feelings, and interactions. Patricia Rivas reported some sense of normalcy and getting back into a routine since school will be starting soon. Clinician discussed parenting challenges and the changes in relationship between husband and daughter.  Clinician normalized this experience due to puberty. Clinician explored Patricia Rivas's coping skills that enable her to be a support for her husband, who is still grieving, and her daughter who is becoming more hormonal daily. Clinician discussed Patricia Rivas's attitude toward father, who continues to bring drama and stress to her. Patricia Rivas shared that she has been taking a step backward away from him, as he increases her anxiety. Clinician validated that this may be a good way to engage in self-care.   Plan: Return again in 2-4 weeks.  Diagnosis: GAD (generalized anxiety disorder)  Bipolar II disorder ([name])  Collaboration of Care: Patient refused AEB none required  Patient/Guardian was advised Release of Information must be obtained prior to any record release in order to collaborate their care with an outside provider. Patient/Guardian was advised if they have not already done so to contact the registration department to sign all necessary forms in order for [name] to release information regarding their care.   Consent: Patient/Guardian gives verbal consent for treatment and assignment of benefits for services provided during this visit. Patient/Guardian expressed understanding and agreed to proceed.   Chryl Heck [place], LCSW 06/19/2023

## 2023-06-26 ENCOUNTER — Ambulatory Visit (HOSPITAL_COMMUNITY): Payer: 59 | Admitting: Licensed Clinical Social Worker

## 2023-06-26 ENCOUNTER — Telehealth (HOSPITAL_COMMUNITY): Payer: 59 | Admitting: Licensed Clinical Social Worker

## 2023-07-10 ENCOUNTER — Ambulatory Visit (INDEPENDENT_AMBULATORY_CARE_PROVIDER_SITE_OTHER): Payer: 59 | Admitting: Licensed Clinical Social Worker

## 2023-07-10 ENCOUNTER — Encounter (HOSPITAL_COMMUNITY): Payer: Self-pay | Admitting: Licensed Clinical Social Worker

## 2023-07-10 DIAGNOSIS — F411 Generalized anxiety disorder: Secondary | ICD-10-CM | POA: Diagnosis not present

## 2023-07-10 DIAGNOSIS — F3181 Bipolar II disorder: Secondary | ICD-10-CM

## 2023-07-10 NOTE — Progress Notes (Signed)
Virtual Visit via Video Note  I connected with Patricia Rivas on 07/10/23 at  8:00 AM EDT by a video enabled telemedicine application and verified that I am speaking with the correct person using two identifiers.  Location: Patient: home Provider: office   I discussed the limitations of evaluation and management by telemedicine and the availability of in person appointments. The patient expressed understanding and agreed to proceed.   I discussed the assessment and treatment plan with the patient. The patient was provided an opportunity to ask questions and all were answered. The patient agreed with the plan and demonstrated an understanding of the instructions.   The patient was advised to call back or seek an in-person evaluation if the symptoms worsen or if the condition fails to improve as anticipated.  I provided 55 minutes of non-face-to-face time during this encounter.   [name], LCSW   THERAPIST PROGRESS NOTE  Session Time: 8:00am-8:55am  Participation Level: Active  Behavioral Response: NeatAlertEuthymic  Type of Therapy: Individual Therapy  Treatment Goals addressed:  "Address my own feelings about my past behaviors and forgive myself so I can have better relationships with my child and husband".  Patricia Rivas will report improvement in her mood, behaviors, and her relationships 5 out of 7 days.      ProgressTowards Goals: Progressing  Interventions: CBT  Summary: Patricia Rivas is a 42 y.o. female who presents with GAD, Bipolar II.   Suicidal/Homicidal: Nowithout intent/plan  Therapist Response: Patricia Rivas engaged well in virtual individual session with clinician. Clinician utilized CBT to process thoughts, feelings, and behaviors. Clinician explored updates in family life and personal mood and interactions. Clinician noted the improvement overall in Patricia Rivas's attitude, emotional health, and energy level since starting Wellbutrin this summer. Patricia Rivas  shared that she has been more present and available to her family and friends. Patricia Rivas shared that husband has decided to return to [employer] and Patricia Rivas also has returned, not due to wanting to drink, but for the community and the space to open up. Clinician reflected the safety in the space and the value of being acutely aware of her emotional state. Patricia Rivas shared positive impact of soccer in her daughter's life, which helps daughter feel more confident and can assist with the battles of puberty. Patricia Rivas shared that overall things are well. Clinician reminded Patricia Rivas about the importance of boundaries when her friends are in crisis. Clinician also validated the urge to help and be supportive, but to also be aware of how other people's problems can impact her.   Plan: Return again in 6 weeks.  Diagnosis: GAD (generalized anxiety disorder)  Bipolar II disorder ([name])  Collaboration of Care: Patient refused AEB none required  Patient/Guardian was advised Release of Information must be obtained prior to any record release in order to collaborate their care with an outside provider. Patient/Guardian was advised if they have not already done so to contact the registration department to sign all necessary forms in order for [name] to release information regarding their care.   Consent: Patient/Guardian gives verbal consent for treatment and assignment of benefits for services provided during this visit. Patient/Guardian expressed understanding and agreed to proceed.   Chryl Heck [place], LCSW 07/10/2023

## 2023-07-18 ENCOUNTER — Ambulatory Visit (HOSPITAL_COMMUNITY): Payer: 59 | Admitting: Student

## 2023-08-06 ENCOUNTER — Institutional Professional Consult (permissible substitution): Payer: Managed Care, Other (non HMO) | Admitting: Plastic Surgery

## 2023-08-13 ENCOUNTER — Ambulatory Visit (HOSPITAL_COMMUNITY): Payer: 59 | Admitting: Student

## 2023-08-15 ENCOUNTER — Other Ambulatory Visit: Payer: Self-pay | Admitting: Obstetrics and Gynecology

## 2023-08-15 DIAGNOSIS — Z1231 Encounter for screening mammogram for malignant neoplasm of breast: Secondary | ICD-10-CM

## 2023-08-20 ENCOUNTER — Encounter (HOSPITAL_COMMUNITY): Payer: Self-pay | Admitting: Student

## 2023-08-20 ENCOUNTER — Ambulatory Visit (HOSPITAL_BASED_OUTPATIENT_CLINIC_OR_DEPARTMENT_OTHER): Payer: 59 | Admitting: Student

## 2023-08-20 DIAGNOSIS — F3281 Premenstrual dysphoric disorder: Secondary | ICD-10-CM

## 2023-08-20 DIAGNOSIS — F411 Generalized anxiety disorder: Secondary | ICD-10-CM

## 2023-08-20 DIAGNOSIS — F3181 Bipolar II disorder: Secondary | ICD-10-CM

## 2023-08-20 MED ORDER — BUPROPION HCL ER (XL) 150 MG PO TB24: 150.0000 mg | ORAL_TABLET | Freq: Every morning | ORAL | 0 refills | Status: DC

## 2023-08-20 MED ORDER — CITALOPRAM HYDROBROMIDE 10 MG PO TABS: 10.0000 mg | ORAL_TABLET | Freq: Every day | ORAL | 0 refills | Status: DC

## 2023-08-20 MED ORDER — GABAPENTIN 300 MG PO CAPS: 300.0000 mg | ORAL_CAPSULE | Freq: Four times a day (QID) | ORAL | 0 refills | Status: DC

## 2023-08-20 MED ORDER — LAMOTRIGINE 200 MG PO TABS: 200.0000 mg | ORAL_TABLET | Freq: Every day | ORAL | 0 refills | Status: DC
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
## 2023-08-21 ENCOUNTER — Ambulatory Visit (INDEPENDENT_AMBULATORY_CARE_PROVIDER_SITE_OTHER): Payer: 59 | Admitting: Licensed Clinical Social Worker

## 2023-08-21 DIAGNOSIS — F411 Generalized anxiety disorder: Secondary | ICD-10-CM

## 2023-08-21 DIAGNOSIS — F3181 Bipolar II disorder: Secondary | ICD-10-CM

## 2023-08-22 ENCOUNTER — Encounter (HOSPITAL_COMMUNITY): Payer: Self-pay | Admitting: Licensed Clinical Social Worker

## 2023-08-22 NOTE — Progress Notes (Signed)
Virtual Visit via Video Note  I connected with Patricia Rivas on 08/22/23 at  8:00 AM EDT by a video enabled telemedicine application and verified that I am speaking with the correct person using two identifiers.  Location: Patient: home Provider: office   I discussed the limitations of evaluation and management by telemedicine and the availability of in person appointments. The patient expressed understanding and agreed to proceed.   I discussed the assessment and treatment plan with the patient. The patient was provided an opportunity to ask questions and all were answered. The patient agreed with the plan and demonstrated an understanding of the instructions.   The patient was advised to call back or seek an in-person evaluation if the symptoms worsen or if the condition fails to improve as anticipated.  I provided 55 minutes of non-face-to-face time during this encounter.   Patricia Melter, LCSW   THERAPIST PROGRESS NOTE  Session Time: 8:00am-8:55am  Participation Level: Active  Behavioral Response: Well GroomedAlertEuthymic  Type of Therapy: Individual Therapy  Treatment Goals addressed:  "Address my own feelings about my past behaviors and forgive myself so I can have better relationships with my child and husband".  Patricia Rivas will report improvement in her mood, behaviors, and her relationships 5 out of 7 days.      ProgressTowards Goals: Progressing  Interventions: Motivational Interviewing  Summary: Patricia Rivas is a 42 y.o. female who presents with GAD, Bipolar II.   Suicidal/Homicidal: Nowithout intent/plan  Therapist Response: Patricia Rivas engaged well in individual virtual session with clinician. Clinician utilized MI OARS to reflect and summarize thoughts, feelings, and interactions. Clinician processed recent changes in her family regarding political affiliation and interactions with staunch "Trumpers" in her husband's family. Clinician discussed ways to  cope with these changes, noting that some family members are no longer in the "inner circle". Clinician discussed the importance of acceptance for others who have different beliefs, as well as the value of choice regarding who gets to be close and who does not. Patricia Rivas shared some excitement and joy that certain family members will not be invited into the inner circle due to the stress and frustration they bring with them. Clinician validated the joy of letting go of stressful people and inviting more people in who bring joy and peace.  Clinician processed updates with daughter, who is going through adolescence and will likely begin menses soon. Clinician provided feedback on development and encouraged Patricia Rivas to remain close to her daughter through this process in order to maintain trust and safety in their relationship.   Plan: Return again in 3-4 weeks.  Diagnosis: GAD (generalized anxiety disorder)  Bipolar II disorder ([name])  Collaboration of Care: Medication Management AEB reviewed recent note from Dr. Cyndie Chime  Patient/Guardian was advised Release of Information must be obtained prior to any record release in order to collaborate their care with an outside provider. Patient/Guardian was advised if they have not already done so to contact the registration department to sign all necessary forms in order for [name] to release information regarding their care.   Consent: Patient/Guardian gives verbal consent for treatment and assignment of benefits for services provided during this visit. Patient/Guardian expressed understanding and agreed to proceed.   Patricia Rivas [place], LCSW 08/22/2023

## 2023-08-23 NOTE — Addendum Note (Signed)
Addended by: Everlena Cooper on: 08/23/2023 05:09 PM   Modules accepted: Level of Service

## 2023-09-11 ENCOUNTER — Ambulatory Visit
Admission: RE | Admit: 2023-09-11 | Discharge: 2023-09-11 | Disposition: A | Discharge status: Home / Self Care | Payer: Managed Care, Other (non HMO) | Source: Ambulatory Visit | Attending: Obstetrics and Gynecology | Admitting: Obstetrics and Gynecology

## 2023-09-11 ENCOUNTER — Ambulatory Visit (HOSPITAL_COMMUNITY): Payer: 59 | Admitting: Licensed Clinical Social Worker

## 2023-09-11 DIAGNOSIS — Z1231 Encounter for screening mammogram for malignant neoplasm of breast: Secondary | ICD-10-CM

## 2023-11-21 ENCOUNTER — Ambulatory Visit (HOSPITAL_COMMUNITY): Payer: 59 | Admitting: Student

## 2023-11-21 ENCOUNTER — Encounter (HOSPITAL_COMMUNITY): Payer: Self-pay | Admitting: Student

## 2023-11-21 VITALS — BP 128/81 | HR 71 | Ht 65.0 in | Wt 207.6 lb

## 2023-11-21 DIAGNOSIS — F430 Acute stress reaction: Secondary | ICD-10-CM

## 2023-11-21 DIAGNOSIS — F411 Generalized anxiety disorder: Secondary | ICD-10-CM

## 2023-11-21 DIAGNOSIS — F3281 Premenstrual dysphoric disorder: Secondary | ICD-10-CM | POA: Diagnosis not present

## 2023-11-21 DIAGNOSIS — F3181 Bipolar II disorder: Secondary | ICD-10-CM

## 2023-11-21 MED ORDER — LAMOTRIGINE 200 MG PO TABS: 200.0000 mg | ORAL_TABLET | Freq: Every day | ORAL | 0 refills | Status: DC

## 2023-11-21 MED ORDER — PROPRANOLOL HCL 10 MG PO TABS: 10.0000 mg | ORAL_TABLET | Freq: Three times a day (TID) | ORAL | 0 refills | Status: DC | PRN

## 2023-11-21 MED ORDER — BUPROPION HCL ER (XL) 150 MG PO TB24: 150.0000 mg | ORAL_TABLET | Freq: Every morning | ORAL | 0 refills | Status: DC

## 2023-11-21 MED ORDER — CITALOPRAM HYDROBROMIDE 10 MG PO TABS: 10.0000 mg | ORAL_TABLET | Freq: Every day | ORAL | 0 refills | Status: DC

## 2023-11-21 MED ORDER — GABAPENTIN 300 MG PO CAPS: 300.0000 mg | ORAL_CAPSULE | Freq: Four times a day (QID) | ORAL | 0 refills | Status: DC

## 2023-11-21 NOTE — Progress Notes (Signed)
BH MD Outpatient Progress Note  11/21/2023 12:36 PM Patricia Rivas  MRN: [id number]  Assessment:  Patricia Rivas presents for follow-up evaluation in-person.   Identifying Information: Patricia Rivas is a 43 y.o. 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   This is my first time meeting her, she was previously following Dr. Michae Kava who was prescribing her wellbutrin 150 mg daily, lamictal 200 mg daily, celexa 10 mg daily  Risk Assessment: An assessment of suicide and violence risk factors was performed as part of this evaluation and is not significantly changed from the last visit.             While future psychiatric events cannot be accurately predicted, the patient does not currently require acute inpatient psychiatric care and does not currently meet [facility] involuntary commitment criteria.          Plan:  # Acute Stress D/o Past medication trials:  Status of problem: Ongoing Abrupt onset about 2 weeks ago, after the presidential inauguration.  Started having symptoms of anxiety, intrusive thoughts, avoidance, poor sleep, irritability, issues with concentration, hypervigilance, no panic attacks. Hasn't worsened, suspect will improve, so will not incr SSRI, however she also has somatic symptoms of palpitations, lump in her throat, chest tightness (which she has had negative cards w/u), especially in the evening when she has nothing else to distract yourself.  For this reason we will start propranolol PRN, suspect she will not be on this for very long.  Other differentials include thyroid disease, she is also due for labs, will obtain labs per below. Interventions: STARTED propranolol 10 mg 3 times daily as needed Labs: CBC, CMP, TSH, FT4, vit D, B12, folate-ordered  11/21/2023  # PMDD + MDD vs Bipolar 2 d/o or with luteal worsening Past medication trials:  Status of problem: Stable I agree with dx of PMDD with historical sxs of mood lability and irritability with difficulties in concentration, sense of being out of control that occurred the week before her menstrual cycle that nearly resolved after start of cycle, and previously had to call out of work for a few days.  However, I am not convinced of her BiPD2 dx at this time, but will leave the dx as this is our first time meeting. She reported hypo-manic sxs of elated mood or irritability, increased energy, increased activity with sense of loss control (shopping, cleaning), distractibility (starting multiple tasks and not completing them). These sxs occurred in the absence of any substances (abstinent since 2011, sxs started 2016/2017) or stressors. These episodes would last 3-4d, then depression would follow. However sxs are associated with cycle, which is inconsistent with BiPD2. Suspect these sxs are also PMDD. Will continue assessing to elucidate dx.  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  However ideally would like to maximize SSRI and decrease Gabapentin, but will hold off at this time since it was a first visit.  Interventions: Continued home Gabapentin 300 mg QID Ssri per above  Health Maintenance PCP: [place],  [place], [place] @ [place] [facility]  OSA on CPAP nightly, per pulm HLD: Crestor 20 mg daily per PCP H/O gastric sleeve (2021): vitamin E, MV  Return to care in: Future Appointments  Date Time Provider Department Center  11/26/2023 10:00 AM [facility] NURSE [facility] None  12/19/2023  8:00 AM  [place], Chryl Heck, LCSW [facility] None  01/28/2024  8:30 AM Princess Bruins, DO [facility] None    Patient was given contact information for behavioral health clinic and was instructed to call 911 for emergencies.    Patient and plan of care will be discussed with the Attending MD, who agrees with the above statement and plan.   Subjective:  Chief Complaint:  Chief Complaint  Patient presents with   Stress   Interval History:  Presented unaccompanied  Adherent to rx per above, started omeprazole  Mood: "more anxious" since inauguration, unsure if this is reason for it - since the last 2 weeks, fine before that, family had been sick for almost a month - she lives not far from the grocery store where the policeman was murdered - a lot negative and worrying thoughts about politics - also has been having something in caught in throat or a clutch/tightening in her chest, last 5 mins, mainly occurs in the evening when she is in her thoughts more  - irritability - no h/o DM or asthma - worsening hot flashes  Sleep: "sleeping but not restful" - exacerbated after the inauguration, issues with falling and staying asleep. Ruminating thoughts and somatic sxs preventing her from falling asleep -Adherent to CPAP  Appetite: eating more  Otherwise patient had no other questions or concerns and was amenable to plan per above.  Safety: Denied active and passive SI, HI, AVH, paranoia.   Review of Systems  Constitutional:  Positive for appetite change and fatigue.  Respiratory:  Negative for shortness of breath.   Cardiovascular:  Negative for chest pain.  Gastrointestinal:  Negative for abdominal pain, constipation and diarrhea.  Neurological:  Negative for dizziness, seizures and light-headedness.   Visit Diagnosis:    ICD-10-CM   1. Acute stress disorder  F43.0     2. PMDD (premenstrual dysphoric disorder)  F32.81     3. GAD (generalized anxiety disorder)  F41.1     4. Bipolar II  disorder ([name])  F31.81       Past Psychiatric History:  Diagnoses: bipolar 2 d/o vs MDD, GAD, PMDD Nicotine use d/o - sustained remission since 2021 AUD, OUD, stimulant use d/o, cannabis use d/o, benzodiazepine use d/o - sustained remission since - sustained remission since 2011   Medication trials:  Zoloft, prozac - unsure why she stopped those, she suspect side effects.  Largely unable to remember any other past psych meds. Previous psychiatrist/therapist:  Seeing psychiatry since around 43yo. [name], MD [phone]) Hospitalizations: Denied Suicide attempts: Denied Hx of violence towards others: remotely when on substances Current access to guns: Denied Hx of trauma/abuse: see above  Substance Use History: EtOH: remission Nicotine:  reports that she quit smoking about 4 years ago. Her smoking use included cigarettes. She started smoking about 19 years ago. She has a 3.8 pack-year smoking history. She has never used smokeless tobacco. 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  Head trauma: Denied Seizures: Denied Allergies: Patient has no known allergies.   Family Psychiatric History:  Suicide: Maternal aunt attempted suicide x3. Friend completed suicide 07/2023.  Husband's friend overdosed 2023.  Homicide: Denied Psych hospitalization: Paternal great grandma-[place] for unclear reasons, maternal aunt BiPD: Maternal  aunt SCZ/SCzA: Denied Substance use: Maternal grandma alcohol use disorder, dad with depression and anxiety qAM  Social History:  Housing: Living in [place], [place] with husband, daughter, dogs x2, chickens x6 Income: employed - Microbiologist at [employer] Education: GED and BA in hx in 2009  Marital Status: Married Children: Therapist, art (born 2014) Support: mom, husband, friends Legal: Denied DUI/DWI: Denied Jail/prison: Denied  Past Medical History:  Past Medical History:  Diagnosis Date   Alcohol dependence in remission ([name])    Anxiety    Anxiety state 05/11/2013   IMO SNOMED Dx Update Oct 2024     Depression    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)    GERD (gastroesophageal reflux disease)    Polysubstance dependence in early, early partial, sustained full, or sustained partial remission ([name]) 09/09/2015   Seasonal allergies    Sleep apnea    Thrombocytopenia ([name])     Past Surgical History:  Procedure Laterality Date   LAPAROSCOPIC GASTRIC SLEEVE RESECTION  06/14/2020   WISDOM TOOTH EXTRACTION     Family History:  Family History  Problem Relation Age of Onset   Depression Mother    Cancer Father        Prostate, colon   Depression Father    Cancer Maternal Aunt        Breast   Depression Maternal Aunt    Breast cancer Maternal Aunt    Depression Maternal Uncle    Breast cancer Maternal Uncle    Depression Paternal Aunt    Depression Paternal Uncle    Bipolar disorder Maternal Grandmother    Alcohol abuse Maternal Grandmother    Breast cancer Cousin    Bipolar disorder Cousin    Social History   Socioeconomic History   Marital status: Married    Spouse name: Not on file   Number of children: Not on file   Years of education: Not on file   Highest education level: Not on file  Occupational History   Not on file  Tobacco Use   Smoking status: Former    Current packs/day: 0.00    Average packs/day: 0.3 packs/day for 15.0 years (3.8 ttl pk-yrs)     Types: Cigarettes    Start date: 01/05/2004    Quit date: 01/05/2019    Years since quitting: 4.8   Smokeless tobacco: Never  Vaping Use   Vaping status: Never Used  Substance and Sexual Activity   Alcohol use: No    Alcohol/week: 0.0 standard drinks of alcohol    Comment: quit 11/2009   Drug use: Yes    Types: Cocaine, Hydromorphone, Hydrocodone, Oxycodone, Benzodiazepines, Marijuana    Comment: quit all in 2009   Sexual activity: Yes    Partners: Male  Other Topics Concern   Not on file  Social History Narrative   Not on file   Social Drivers of Health   Financial Resource Strain: Not on file  Food Insecurity: No Food Insecurity (06/14/2020)   Received from [facility] visits prior to  12/23/2022., [facility] visits prior to 12/23/2022.   Hunger Vital Sign    Worried About Programme researcher, broadcasting/film/video in the Last Year: Never true    Ran Out of Food in the Last Year: Never true  Transportation Needs: Not on file  Physical Activity: Not on file  Stress: Not on file  Social Connections: Not on file    Allergies: No Known Allergies  Current Medications: Current Outpatient Medications  Medication Sig Dispense Refill   Biotin 82956 MCG TABS Take 1 tablet by mouth.     buPROPion (WELLBUTRIN XL) 150 MG 24 hr tablet Take 1 tablet (150 mg total) by mouth every morning. 90 tablet 0   cholecalciferol (VITAMIN D3) 25 MCG (1000 UNIT) tablet Take 1,000 Units by mouth daily.     citalopram (CELEXA) 10 MG tablet Take 1 tablet (10 mg total) by mouth daily. 90 tablet 0   gabapentin (NEURONTIN) 300 MG capsule Take 1 capsule (300 mg total) by mouth 4 (four) times daily. 360 capsule 0   lamoTRIgine (LAMICTAL) 200 MG tablet Take 1 tablet (200 mg total) by mouth daily. 90 tablet 0   Multiple Vitamin (MULTI VITAMIN) TABS Take 5 mg by mouth at bedtime as needed.     norgestimate-ethinyl estradiol (ORTHO-CYCLEN) 0.25-35 MG-MCG tablet Take 1 tablet by mouth daily.      rosuvastatin (CRESTOR) 20 MG tablet Take 20 mg by mouth daily.     Vitamin E (VITAMIN E/D-ALPHA NATURAL) 268 MG (400 UNIT) CAPS Take by mouth.     vitamin E 200 UNIT capsule Take 200 Units by mouth daily.     No current facility-administered medications for this visit.    Objective: Psychiatric Specialty Exam: There were no vitals taken for this visit.There is no height or weight on file to calculate BMI.  General Appearance: Casual, faily groomed  Eye Contact:  Good    Speech:  Clear, coherent, normal rate   Volume:  Normal   Mood:  see above  Affect:  Appropriate, congruent, full range  Thought Content: Logical, rumination  Suicidal Thoughts: see above  Thought Process:  Coherent, goal-directed, linear   Orientation:  A&Ox4   Memory:  Immediate good  Judgment:  Fair   Insight:  Fair   Concentration:  Attention and concentration good   Recall:  Good  Fund of Knowledge: Good  Language: Good, fluent  Psychomotor Activity: Normal  Akathisia:  NA   AIMS (if indicated): NA   Assets:  Communication Skills Desire for Improvement Financial Resources/Insurance Housing Intimacy Physical Health Resilience Social Support Talents/Skills Transportation Vocational/Educational  ADL's:  Intact  Cognition: WNL  Sleep:  see above    Physical Exam Vitals and nursing note reviewed.  Constitutional:      General: She is awake. She is not in acute distress.    Appearance: She is not ill-appearing, toxic-appearing or diaphoretic.  HENT:     Head: Normocephalic.  Eyes:     Conjunctiva/sclera: Conjunctivae normal.  Pulmonary:     Effort: Pulmonary effort is normal. No respiratory distress.  Neurological:     General: No focal deficit present.     Mental Status: She is alert and oriented to person, place, and time.     Gait: Gait normal.     Metabolic Disorder Labs: No results found for: "HGBA1C", "MPG" No results found for: "PROLACTIN" No results found for: "CHOL", "TRIG",  "HDL", "CHOLHDL", "VLDL", "LDLCALC" No results found for: "TSH"  Therapeutic Level Labs: Lab  Results  Component Value Date   LITHIUM 0.40 (L) 03/09/2015   No results found for: "VALPROATE" No results found for: "CBMZ"  Screenings: PHQ2-9    Flowsheet Row Video [facility] Video Visit from 02/15/2023 in [facility] Video Visit from 11[facility]23/2022 in [facility] Video Visit from 08/31/2022 in [facility] Video Visit from 06/29/2022 in [facility]  PHQ-2 Total Score 1 0 0 0 0      Flowsheet Row Video Visit from 03/29/2023 in [facility] Video Visit from 02/15/2023 in [facility] Video Visit from 11/23/2022 in [facility]  C-SSRS RISK CATEGORY No Risk No Risk No Risk       Patient/Guardian was advised Release of Information must be obtained prior to any record release in order to collaborate their care with an outside provider. Patient/Guardian was advised if they have not already done so to contact the registration department to sign all necessary forms in order for [name] to release information regarding their care.   Consent: Patient/Guardian gives verbal consent for treatment and assignment of benefits for services provided during this visit. Patient/Guardian expressed understanding and agreed to proceed.   Princess Bruins, DO Psych Resident, PGY-3

## 2023-11-23 NOTE — Addendum Note (Signed)
Addended by: Everlena Cooper on: 11/23/2023 01:09 PM   Modules accepted: Level of Service

## 2023-11-26 ENCOUNTER — Ambulatory Visit (HOSPITAL_BASED_OUTPATIENT_CLINIC_OR_DEPARTMENT_OTHER): Payer: 59

## 2023-11-26 DIAGNOSIS — Z79899 Other long term (current) drug therapy: Secondary | ICD-10-CM | POA: Diagnosis not present

## 2023-11-26 DIAGNOSIS — F411 Generalized anxiety disorder: Secondary | ICD-10-CM

## 2023-11-26 NOTE — Progress Notes (Signed)
Patient arrives today for her due labs. Patient tolerated well and without complaint

## 2023-11-27 LAB — CBC WITH DIFFERENTIAL/PLATELET
Basophils Absolute: 0.1 10*3/uL (ref 0.0–0.2)
Basos: 1 %
EOS (ABSOLUTE): 0.2 10*3/uL (ref 0.0–0.4)
Eos: 3 %
Hematocrit: 41.3 % (ref 34.0–46.6)
Hemoglobin: 13.9 g/dL (ref 11.1–15.9)
Immature Grans (Abs): 0 10*3/uL (ref 0.0–0.1)
Immature Granulocytes: 0 %
Lymphocytes Absolute: 1.8 10*3/uL (ref 0.7–3.1)
Lymphs: 29 %
MCH: 29.4 pg (ref 26.6–33.0)
MCHC: 33.7 g/dL (ref 31.5–35.7)
MCV: 87 fL (ref 79–97)
Monocytes Absolute: 0.5 10*3/uL (ref 0.1–0.9)
Monocytes: 8 %
Neutrophils Absolute: 3.7 10*3/uL (ref 1.4–7.0)
Neutrophils: 59 %
Platelets: 277 10*3/uL (ref 150–450)
RBC: 4.73 x10E6/uL (ref 3.77–5.28)
RDW: 11.8 % (ref 11.7–15.4)
WBC: 6.3 10*3/uL (ref 3.4–10.8)

## 2023-11-27 LAB — SPECIMEN STATUS REPORT

## 2023-11-27 LAB — T4 AND TSH
T4, Total: 11 ug/dL (ref 4.5–12.0)
TSH: 0.702 u[IU]/mL (ref 0.450–4.500)

## 2023-11-27 LAB — VITAMIN D 25 HYDROXY (VIT D DEFICIENCY, FRACTURES): Vit D, 25-Hydroxy: 44.5 ng/mL (ref 30.0–100.0)

## 2023-11-27 LAB — B12 AND FOLATE PANEL
Folate: 16 ng/mL (ref >3.0)
Vitamin B-12: 304 pg/mL (ref 232–1245)

## 2023-11-28 LAB — CMP14+EGFR
ALT: 9 IU/L (ref 0–32)
AST: 11 IU/L (ref 0–40)
Albumin: 4 g/dL (ref 3.9–4.9)
Alkaline Phosphatase: 75 IU/L (ref 44–121)
BUN/Creatinine Ratio: 16 (ref 9–23)
BUN: 10 mg/dL (ref 6–24)
Bilirubin Total: 0.3 mg/dL (ref 0.0–1.2)
CO2: 19 mmol/L — ABNORMAL LOW (ref 20–29)
Calcium: 8.9 mg/dL (ref 8.7–10.2)
Chloride: 101 mmol/L (ref 96–106)
Creatinine, Ser: 0.62 mg/dL (ref 0.57–1.00)
Globulin, Total: 2.5 g/dL (ref 1.5–4.5)
Glucose: 93 mg/dL (ref 70–99)
Potassium: 4.2 mmol/L (ref 3.5–5.2)
Sodium: 140 mmol/L (ref 134–144)
Total Protein: 6.5 g/dL (ref 6.0–8.5)
eGFR: 114 mL/min/{1.73_m2} (ref >59)

## 2023-11-28 LAB — SPECIMEN STATUS REPORT

## 2023-12-19 ENCOUNTER — Ambulatory Visit (INDEPENDENT_AMBULATORY_CARE_PROVIDER_SITE_OTHER): Payer: 59 | Admitting: Licensed Clinical Social Worker

## 2023-12-19 ENCOUNTER — Encounter (HOSPITAL_COMMUNITY): Payer: Self-pay | Admitting: Licensed Clinical Social Worker

## 2023-12-19 DIAGNOSIS — F411 Generalized anxiety disorder: Secondary | ICD-10-CM | POA: Diagnosis not present

## 2023-12-19 DIAGNOSIS — F3181 Bipolar II disorder: Secondary | ICD-10-CM

## 2023-12-19 NOTE — Progress Notes (Signed)
 Virtual Visit via Video Note  I connected with Patricia Rivas on 12/19/23 at  8:00 AM EST by a video enabled telemedicine application and verified that I am speaking with the correct person using two identifiers.  Location: Patient: Home Provider: Home office   I discussed the limitations of evaluation and management by telemedicine and the availability of in person appointments. The patient expressed understanding and agreed to proceed.  H   I discussed the assessment and treatment plan with the patient. The patient was provided an opportunity to ask questions and all were answered. The patient agreed with the plan and demonstrated an understanding of the instructions.   The patient was advised to call back or seek an in-person evaluation if the symptoms worsen or if the condition fails to improve as anticipated.  I provided 55 minutes of non-face-to-face time during this encounter.   [name], LCSW   THERAPIST PROGRESS NOTE  Session Time: 8:00am-8:55am  Participation Level: Active  Behavioral Response: NeatAlertEuthymic  Type of Therapy: Individual Therapy  Treatment Goals addressed: "Address my own feelings about my past behaviors and forgive myself so I can have better relationships with my child and husband".  Patricia Rivas will report improvement in her mood, behaviors, and her relationships 5 out of 7 days.    ProgressTowards Goals: Progressing  Interventions: CBT  Summary: Patricia Rivas is a 43 y.o. female who presents with GAD and Bipolar II.   Suicidal/Homicidal: Nowithout intent/plan  Therapist Response: Patricia Rivas engaged well in individual virtual session with clinician.  Clinician utilized CBT to process thoughts feelings and interactions.  Clinician discussed updates and family and in Patricia Rivas's mood.  Patricia Rivas shared changes and improvement in her mood over the past week or so.  Patricia Rivas shared that the beginning of the year consisted of a great deal of  personal illness, as well as illness of husband and daughter.  Clinician processed mood changes due to illness noting the common theme of increased depression due to poor health.  Clinician assisted in parenting tools for Patricia Rivas's daughter. Clinician validated the challenges of managing a preteen.  Patricia Rivas also shared many losses of relationships with family members due to the political landscape.  Patricia Rivas shared this was more of a struggle for her husband than for her.  Plan: Return again in 2 weeks.  Diagnosis: GAD (generalized anxiety disorder)  Bipolar II disorder ([name])  Collaboration of Care: Medication Management AEB reviewed note from last appointment.  Patient/Guardian was advised Release of Information must be obtained prior to any record release in order to collaborate their care with an outside provider. Patient/Guardian was advised if they have not already done so to contact the registration department to sign all necessary forms in order for [name] to release information regarding their care.   Consent: Patient/Guardian gives verbal consent for treatment and assignment of benefits for services provided during this visit. Patient/Guardian expressed understanding and agreed to proceed.   Patricia Heck [place], LCSW 12/19/2023

## 2024-01-03 ENCOUNTER — Ambulatory Visit (HOSPITAL_COMMUNITY): Payer: 59 | Admitting: Licensed Clinical Social Worker

## 2024-01-03 DIAGNOSIS — F3181 Bipolar II disorder: Secondary | ICD-10-CM | POA: Diagnosis not present

## 2024-01-03 DIAGNOSIS — F411 Generalized anxiety disorder: Secondary | ICD-10-CM | POA: Diagnosis not present

## 2024-01-03 NOTE — Progress Notes (Signed)
 Virtual Visit via Video Note  I connected with Tarsha L Wardlow on 01/03/24 at  8:00 AM EDT by a video enabled telemedicine application and verified that I am speaking with the correct person using two identifiers.  Location: Patient: home Provider: home office   I discussed the limitations of evaluation and management by telemedicine and the availability of in person appointments. The patient expressed understanding and agreed to proceed.  I discussed the assessment and treatment plan with the patient. The patient was provided an opportunity to ask questions and all were answered. The patient agreed with the plan and demonstrated an understanding of the instructions.   The patient was advised to call back or seek an in-person evaluation if the symptoms worsen or if the condition fails to improve as anticipated.  I provided 55 minutes of non-face-to-face time during this encounter.   [name], LCSW   THERAPIST PROGRESS NOTE  Session Time: 8:00am-8:55am  Participation Level: Active  Behavioral Response: NeatAlertAnxious  Type of Therapy: Individual Therapy  Treatment Goals addressed: "Address my own feelings about my past behaviors and forgive myself so I can have better relationships with my child and husband".  Bethania will report improvement in her mood, behaviors, and her relationships 5 out of 7 days.    ProgressTowards Goals: Progressing  Interventions: CBT  Summary: BETTE BRIENZA is a 43 y.o. female who presents with GAD and Bipolar II.   Suicidal/Homicidal: Nowithout intent/plan  Therapist Response: Teddi engaged well in individual virtual session with clinician. Clinician utilized CBT to process thoughts, feelings, and interactions. Clinician explored updates at home and in the family. Keylani shared concerns about relationships with family members. Clinician discussed ways to empathize with those who have wronged her and identified that this does not  have to change her boundaries in order to have empathy. Clinician validated frustration and sadness about certain experiences of her life. Clinician also noted the challenges of raising an adolescent girl while she is going through perimenopause. Clinician also reflected the transference issues that occur while her daughter goes through the ages of Matalyn's traumatic experiences.   Plan: Return again in 3-4 weeks.  Diagnosis: GAD (generalized anxiety disorder)  Bipolar II disorder ([name])  Collaboration of Care: Patient refused AEB none required  Patient/Guardian was advised Release of Information must be obtained prior to any record release in order to collaborate their care with an outside provider. Patient/Guardian was advised if they have not already done so to contact the registration department to sign all necessary forms in order for [name] to release information regarding their care.   Consent: Patient/Guardian gives verbal consent for treatment and assignment of benefits for services provided during this visit. Patient/Guardian expressed understanding and agreed to proceed.   Chryl Heck [place], LCSW 01/03/2024

## 2024-01-12 ENCOUNTER — Encounter (HOSPITAL_COMMUNITY): Payer: Self-pay | Admitting: Licensed Clinical Social Worker

## 2024-01-28 ENCOUNTER — Encounter (HOSPITAL_COMMUNITY): Payer: Self-pay | Admitting: Student

## 2024-01-28 ENCOUNTER — Ambulatory Visit (HOSPITAL_BASED_OUTPATIENT_CLINIC_OR_DEPARTMENT_OTHER): Payer: 59 | Admitting: Student

## 2024-01-28 DIAGNOSIS — F411 Generalized anxiety disorder: Secondary | ICD-10-CM | POA: Diagnosis not present

## 2024-01-28 DIAGNOSIS — F339 Major depressive disorder, recurrent, unspecified: Secondary | ICD-10-CM | POA: Diagnosis not present

## 2024-01-28 DIAGNOSIS — F1921 Other psychoactive substance dependence, in remission: Secondary | ICD-10-CM

## 2024-01-28 MED ORDER — BUPROPION HCL ER (XL) 150 MG PO TB24: 150.0000 mg | ORAL_TABLET | Freq: Every morning | ORAL | 0 refills | Status: DC

## 2024-01-28 MED ORDER — VORTIOXETINE HBR 10 MG PO TABS: 10.0000 mg | ORAL_TABLET | Freq: Every day | ORAL | 0 refills | Status: DC

## 2024-01-28 NOTE — Addendum Note (Signed)
 Addended by: Everlena Cooper on: 01/28/2024 02:47 PM   Modules accepted: Level of Service

## 2024-01-28 NOTE — Progress Notes (Signed)
 BH MD Outpatient Progress Note  01/28/2024 1:28 PM Patricia Rivas  MRN: [id number]  Assessment:  Patricia Rivas presents for follow-up evaluation in-person.   Identifying Information: Patricia Rivas is a 43 y.o. female with a documented history of MDD w luteal worsening vs unspecified mood d/o, GAD, polysubstance use d/o in sustained remission (2011, bzo, etoh, opioids, stimulant, cannabis, nic), no suicide attempts or inpatient psych admission, who is an established patient with [facility] for management of mood d/o.   Risk Assessment: An assessment of suicide and violence risk factors was performed as part of this evaluation and is not significantly changed from the last visit.             While future psychiatric events cannot be accurately predicted, the patient does not currently require acute inpatient psychiatric care and does not currently meet [facility] involuntary commitment criteria.          Plan:  # MDD with luteal worsening vs unspecified mood d/o (Previously BiPD2, PMDD) Past medication trials: zoloft, prozac, celexa, wellbutrin, lamictal Status of problem: ongoing Historical dx of BiPD2 however on further review symptoms may be better explained by hormonal fluctuation + MDD with atypical features, and past substance use as mood symptoms in the past were most severe during active use or right before the onset of her menstrual cycle. Then resolved with combined OCP, which is not typical in hypo-/mania. However will continue to tease out dx since this is only my 3rd time meeting her.  Starting to have return of some depressed mood, irritability, tearfulness. On top of unrestful sleep and overeating. Consistent with atypical features. Considered increasing her celexa, however at higher doses she had sexual side effects, for that reason will opt for trintellix. If insurance doesn't cover, will try cymbalta as an alternative option. No other med  changes to other rx at this time.  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 She is experiencing more of the somatic sxs of anxiety than cognitive, her ruminating thoughts are more negative world view than stress and worry.  Goal is to control mood sxs with ssri per above and taper off of gabapentin, especially given her h/o etoh and bzo use. She is aware and understands this.  Interventions: Continued home Gabapentin 300 mg QID Continued home propranolol 10 mg TID PRN Ssri per above  Health Maintenance PCP: Milus Height, PA @ [facility]  OSA on CPAP nightly, per pulm HLD: Crestor 20 mg daily per PCP H/O gastric sleeve (2021): vitamin E, MV  Return to care in: Future Appointments  Date Time Provider Department Center  01/29/2024  8:00 AM Schlosberg, Chryl Heck, LCSW [facility] None  02/25/2024  8:00 AM Princess Bruins, DO [facility] None   Patient was given contact information for behavioral health clinic and was instructed to call 911 for emergencies.    Patient and plan of care will be discussed with the Attending MD, who agrees with the above statement and plan.   Subjective:  Chief Complaint:  Chief Complaint  Patient  presents with   Medication Management   Stress   Fatigue   Anxiety   Depression   Interval History:  PDMP gabapentin 300 mg 360qty x90d - 12/11/2023  Presented unaccompanied  Mood: panic is gone, found that propranolol was really helpful with that. However she  still feels "irritable, anxious" some tearfulness and feeling depressed. Higher does celexa resulted in sexual side effects which is why she is on 10 mg. Amenable to trialing a different rx for mood and to prevent sexual side effects. Has never tried cymbalta, pristiq, or trintellix, but is ok with starting any of them, depending on insurance.    Sleep: "still not restful", feels tired all day -Adherent to CPAP  Appetite: eating more, no change since last time  Discussed bipd dx again.  Denied having abnormal levels of increased energy persistently. Again she mentions increased energy the day before her period, where she feels more outgoing and motivated. No decreased need for sleep, no risky behaviors like excessive spending, hypersexual, dangerous activities, no grandiosity. Does have some irritability, but not abnormal amount, just some. She has not had any of these sxs since starting OCP.   Hot flashes still present.  Otherwise patient had no other questions or concerns and was amenable to plan per above.  Safety: Denied active and passive SI, HI, AVH, paranoia.   Review of Systems  Constitutional:  Positive for appetite change and fatigue.  Respiratory:  Negative for shortness of breath.   Cardiovascular:  Negative for chest pain.  Gastrointestinal:  Negative for abdominal pain, constipation and diarrhea.  Neurological:  Negative for dizziness, tremors, seizures and light-headedness.   Visit Diagnosis:    ICD-10-CM   1. GAD (generalized anxiety disorder)  F41.1 buPROPion (WELLBUTRIN XL) 150 MG 24 hr tablet    vortioxetine HBr (TRINTELLIX) 10 MG TABS tablet    2. MDD (major depressive disorder), recurrent episode, with atypical features, with luteal worsening ([name])  F33.9 buPROPion (WELLBUTRIN XL) 150 MG 24 hr tablet    vortioxetine HBr (TRINTELLIX) 10 MG TABS tablet    3. Polysubstance dependence in early, early partial, sustained full, or sustained partial remission ([name])  F19.21  buPROPion (WELLBUTRIN XL) 150 MG 24 hr tablet    vortioxetine HBr (TRINTELLIX) 10 MG TABS tablet     Past Psychiatric History:  Diagnoses:  MDD with luteal worsening and atypical features vs unspecified mood d/o (previously PMDD, BiPD2), GAD, panic d/o. acute stress reaction Nicotine use d/o, AUD, OUD, stimulant use d/o, cannabis use d/o, benzodiazepine use d/o - sustained remission since - sustained remission since 2011   Medication trials:  Zoloft (2012, up to 100 mg was drinking at the time), prozac - unsure why she stopped those, she suspect side effects, was drinking at the time.  Largely unable to remember any other past psych meds. Celexa (dc 01/2024 for sexual side effects at >10 mg and partial effectiveness) Previous psychiatrist/therapist:  [facility] since around 43yo. [name], MD [phone]). [facility] (2024-current) Hospitalizations: Denied Suicide attempts: Denied Hx of violence towards others: remotely when on substances Current access to guns: Denied Hx of trauma/abuse: see above  Substance Use History: EtOH: remission Nicotine:  reports that she quit smoking about 5 years ago. Her smoking use included cigarettes. She started smoking about 20 years ago. She has a 3.8 pack-year smoking history. She has never used smokeless tobacco. 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  Head trauma: Denied Seizures: Denied Allergies: Patient has no known allergies.   Family Psychiatric History:  Suicide: Maternal aunt attempted suicide x3. Friend completed suicide 07/2023.  Husband's friend overdosed 2023.  Homicide: Denied Psych hospitalization: Paternal great grandma-Butner for unclear reasons, maternal aunt BiPD: Maternal aunt SCZ/SCzA: Denied Substance use: Maternal grandma alcohol use disorder, dad with depression and anxiety qAM  Social History:  Housing: Living in [place], [place] with husband, daughter, dogs x2, chickens x6 Income: employed - Microbiologist at [employer] Education: GED and BA in hx in 2009  Marital Status: Married Children: Therapist, art (born 2014) Support: mom, husband, friends Legal: Denied DUI/DWI: Denied Jail/prison: Denied  Past Medical History:  Past Medical History:  Diagnosis Date   Alcohol dependence in remission ([name])    Anxiety    Anxiety state 05/11/2013   IMO SNOMED Dx Update Oct 2024     Depression    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  Contractions started within 2 hrs of rupture . Cx was closed with 80 % effaced. Magnesium started to allow for steroids     Seasonal allergies    Sleep apnea     Thrombocytopenia ([name])    Thrombocytopenia, unspecified ([name]) 12/03/2012    Past Surgical History:  Procedure Laterality Date   LAPAROSCOPIC GASTRIC SLEEVE RESECTION  06/14/2020   WISDOM TOOTH EXTRACTION     Family History:  Family History  Problem Relation Age of Onset   Depression Mother    Cancer Father        Prostate, colon   Depression Father    Cancer Maternal Aunt        Breast   Depression Maternal Aunt    Breast cancer Maternal Aunt    Depression Maternal Uncle    Breast cancer Maternal Uncle    Depression Paternal Aunt    Depression Paternal Uncle    Bipolar disorder Maternal Grandmother    Alcohol abuse Maternal Grandmother    Breast cancer Cousin    Bipolar disorder Cousin    Social History   Socioeconomic History   Marital status: Married    Spouse name: Not on file   Number of children: Not on file   Years of education: Not on file   Highest education level: Not on file  Occupational History   Not on file  Tobacco Use   Smoking status: Former    Current packs/day: 0.00    Average packs/day: 0.3 packs/day for 15.0 years (3.8 ttl pk-yrs)  Types: Cigarettes    Start date: 01/05/2004    Quit date: 01/05/2019    Years since quitting: 5.0   Smokeless tobacco: Never  Vaping Use   Vaping status: Never Used  Substance and Sexual Activity   Alcohol use: No    Alcohol/week: 0.0 standard drinks of alcohol    Comment: quit 11/2009   Drug use: Yes    Types: Cocaine, Hydromorphone, Hydrocodone, Oxycodone, Benzodiazepines, Marijuana    Comment: quit all in 2009   Sexual activity: Yes    Partners: Male  Other Topics Concern   Not on file  Social History Narrative   Not on file   Social Drivers of Health   Financial Resource Strain: Not on file  Food Insecurity: No Food Insecurity (06/14/2020)   [facility] from [facility] prior to 12/23/2022., [facility] visits prior to 12/23/2022.   Hunger Vital Sign     Worried About Programme researcher, broadcasting/film/video in the Last Year: Never true    Ran Out of Food in the Last Year: Never true  Transportation Needs: Not on file  Physical Activity: Not on file  Stress: Not on file  Social Connections: Not on file    Allergies: No Known Allergies  Current Medications: Current Outpatient Medications  Medication Sig Dispense Refill   vortioxetine HBr (TRINTELLIX) 10 MG TABS tablet Take 1 tablet (10 mg total) by mouth daily. 30 tablet 0   Biotin 40981 MCG TABS Take 1 tablet by mouth.     buPROPion (WELLBUTRIN XL) 150 MG 24 hr tablet Take 1 tablet (150 mg total) by mouth every morning. 30 tablet 0   cholecalciferol (VITAMIN D3) 25 MCG (1000 UNIT) tablet Take 1,000 Units by mouth daily.     gabapentin (NEURONTIN) 300 MG capsule Take 1 capsule (300 mg total) by mouth 4 (four) times daily. 360 capsule 0   lamoTRIgine (LAMICTAL) 200 MG tablet Take 1 tablet (200 mg total) by mouth daily. 90 tablet 0   Multiple Vitamin (MULTI VITAMIN) TABS Take 5 mg by mouth at bedtime as needed.     norgestimate-ethinyl estradiol (ORTHO-CYCLEN) 0.25-35 MG-MCG tablet Take 1 tablet by mouth daily.     propranolol (INDERAL) 10 MG tablet Take 1 tablet (10 mg total) by mouth every 8 (eight) hours as needed. 270 tablet 0   rosuvastatin (CRESTOR) 20 MG tablet Take 20 mg by mouth daily.     Vitamin E (VITAMIN E/D-ALPHA NATURAL) 268 MG (400 UNIT) CAPS Take by mouth.     vitamin E 200 UNIT capsule Take 200 Units by mouth daily.     No current facility-administered medications for this visit.    Objective: Psychiatric Specialty Exam: There were no vitals taken for this visit.There is no height or weight on file to calculate BMI.  General Appearance: Casual, fairly groomed, pleasant, engaged  Eye Contact:  Good    Speech:  Clear, coherent, normal rate, spontaneous  Volume:  Normal   Mood:  see above  Affect:  Appropriate, congruent, full range, anxious appearing at times  Thought Content: Logical,  rumination  Suicidal Thoughts: see above  Thought Process:  Coherent, goal-directed, linear   Orientation:  A&Ox4   Memory:  Immediate good  Judgment:  Fair   Insight:  Fair   Concentration:  Attention and concentration good   Recall:  Good  Fund of Knowledge: Good  Language: Good, fluent  Psychomotor Activity: Normal  Akathisia:  NA   AIMS (if  indicated): NA   Assets:  Communication Skills Desire for Improvement Financial Resources/Insurance Housing Intimacy Physical Health Resilience Social Support Talents/Skills Transportation Vocational/Educational  ADL's:  Intact  Cognition: WNL  Sleep:  see above    Physical Exam Vitals and nursing note reviewed.  Constitutional:      General: She is awake. She is not in acute distress.    Appearance: She is not ill-appearing, toxic-appearing or diaphoretic.  HENT:     Head: Normocephalic.  Eyes:     Conjunctiva/sclera: Conjunctivae normal.  Pulmonary:     Effort: Pulmonary effort is normal. No respiratory distress.  Neurological:     General: No focal deficit present.     Mental Status: She is alert and oriented to person, place, and time.     Gait: Gait normal.   Metabolic Disorder Labs: No results found for: "HGBA1C", "MPG" No results found for: "PROLACTIN" No results found for: "CHOL", "TRIG", "HDL", "CHOLHDL", "VLDL", "LDLCALC" Lab Results  Component Value Date   TSH 0.702 11/26/2023   Therapeutic Level Labs: Lab Results  Component Value Date   LITHIUM 0.40 (L) 03/09/2015   No results found for: "VALPROATE" No results found for: "CBMZ"  Screenings: PHQ2-9    Flowsheet Row Video [facility] Video Visit from 02/15/2023 in [facility] Video Visit from 11[facility]23/2022 in [facility] Video Visit from 08/31/2022 in [facility] Video Visit from  06/29/2022 [facility]  PHQ-2 Total Score 1 0 0 0 0      Flowsheet Row Video Visit from 03/29/2023 in [facility] Video Visit from 02/15/2023 in [facility] Video Visit from 11[facility]23/2022 in [facility]  C-SSRS RISK CATEGORY No Risk No Risk No Risk      Patient/Guardian was advised Release of Information must be obtained prior to any record release in order to collaborate their care with an outside provider. Patient/Guardian was advised if they have not already done so to contact the registration department to sign all necessary forms in order for [name] to release information regarding their care.   Consent: Patient/Guardian gives verbal consent for treatment and assignment of benefits for services provided during this visit. Patient/Guardian expressed understanding and agreed to proceed.   Princess Bruins, DO Psych Resident, PGY-3

## 2024-01-29 ENCOUNTER — Encounter (HOSPITAL_COMMUNITY): Payer: Self-pay | Admitting: Licensed Clinical Social Worker

## 2024-01-29 ENCOUNTER — Ambulatory Visit (INDEPENDENT_AMBULATORY_CARE_PROVIDER_SITE_OTHER): Admitting: Licensed Clinical Social Worker

## 2024-01-29 DIAGNOSIS — F411 Generalized anxiety disorder: Secondary | ICD-10-CM | POA: Diagnosis not present

## 2024-01-29 NOTE — Progress Notes (Signed)
 Virtual Visit via Video Note  I connected with Patricia Rivas on 01/29/24 at  8:00 AM EDT by a video enabled telemedicine application and verified that I am speaking with the correct person using two identifiers.  Location: Patient: home Provider: office   I discussed the limitations of evaluation and management by telemedicine and the availability of in person appointments. The patient expressed understanding and agreed to proceed.   I discussed the assessment and treatment plan with the patient. The patient was provided an opportunity to ask questions and all were answered. The patient agreed with the plan and demonstrated an understanding of the instructions.   The patient was advised to call back or seek an in-person evaluation if the symptoms worsen or if the condition fails to improve as anticipated.  I provided 55 minutes of non-face-to-face time during this encounter.   [name], LCSW   THERAPIST PROGRESS NOTE  Session Time: 8:05am-9:00am  Participation Level: Active  Behavioral Response: NeatAlertDepressed  Type of Therapy: Individual Therapy  Treatment Goals addressed: "Address my own feelings about my past behaviors and forgive myself so I can have better relationships with my child and husband".  Patricia Rivas will report improvement in her mood, behaviors, and her relationships 5 out of 7 days.      ProgressTowards Goals: Progressing  Interventions: CBT  Summary: Patricia Rivas is a 43 y.o. female who presents with GAD.   Suicidal/Homicidal: Nowithout intent/plan  Therapist Response: Nahomi engaged well in individual virtual session with clinician. Clinician utilized CBT to process thoughts, feelings, and interactions. Clinician identified ongoing significant stress in Patricia Rivas's world due to recent death of maternal uncle and strife between husband and his family of origin. Clinician processed the impact of stress and grief on Patricia Rivas's mood.  Clinician explored moodiness or irritability occurring toward husband and daughter. Clinician processed the importance of taking a brief pause when she realizes she is triggered in order to respond rather than react.  Clinician discussed previous dx of Bipolar II and noted that this may not be an appropriate dx for Patricia Rivas, as there have not been any episodes of mania in many years. Clinician will discuss this with provider.    Plan: Return again in 3-4 weeks.  Diagnosis: GAD (generalized anxiety disorder)  Collaboration of Care: Psychiatrist AEB will discuss dx of Bipolar II  Patient/Guardian was advised Release of Information must be obtained prior to any record release in order to collaborate their care with an outside provider. Patient/Guardian was advised if they have not already done so to contact the registration department to sign all necessary forms in order for [name] to release information regarding their care.   Consent: Patient/Guardian gives verbal consent for treatment and assignment of benefits for services provided during this visit. Patient/Guardian expressed understanding and agreed to proceed.   Chryl Heck [place], LCSW 01/29/2024

## 2024-02-05 ENCOUNTER — Other Ambulatory Visit (HOSPITAL_COMMUNITY): Payer: Self-pay | Admitting: Student

## 2024-02-05 DIAGNOSIS — F339 Major depressive disorder, recurrent, unspecified: Secondary | ICD-10-CM

## 2024-02-05 DIAGNOSIS — F411 Generalized anxiety disorder: Secondary | ICD-10-CM

## 2024-02-05 MED ORDER — DULOXETINE HCL 30 MG PO CPEP: 30.0000 mg | ORAL_CAPSULE | Freq: Every morning | ORAL | 0 refills | Status: DC

## 2024-02-05 NOTE — Telephone Encounter (Signed)
 Will dc trintellix because of price and opting for cymbalta instead, as mentioned during last encounter

## 2024-02-25 ENCOUNTER — Telehealth (HOSPITAL_BASED_OUTPATIENT_CLINIC_OR_DEPARTMENT_OTHER): Admitting: Student

## 2024-02-25 ENCOUNTER — Encounter (HOSPITAL_COMMUNITY): Payer: Self-pay | Admitting: Student

## 2024-02-25 DIAGNOSIS — F1921 Other psychoactive substance dependence, in remission: Secondary | ICD-10-CM | POA: Diagnosis not present

## 2024-02-25 DIAGNOSIS — F339 Major depressive disorder, recurrent, unspecified: Secondary | ICD-10-CM | POA: Diagnosis not present

## 2024-02-25 DIAGNOSIS — F411 Generalized anxiety disorder: Secondary | ICD-10-CM | POA: Diagnosis not present

## 2024-02-25 DIAGNOSIS — F3281 Premenstrual dysphoric disorder: Secondary | ICD-10-CM

## 2024-02-25 DIAGNOSIS — F3181 Bipolar II disorder: Secondary | ICD-10-CM

## 2024-02-25 MED ORDER — PROPRANOLOL HCL 10 MG PO TABS
10.0000 mg | ORAL_TABLET | Freq: Three times a day (TID) | ORAL | 0 refills | Status: DC | PRN
Start: 2024-02-25 — End: 2024-06-04

## 2024-02-25 MED ORDER — DULOXETINE HCL 60 MG PO CPEP: 60.0000 mg | ORAL_CAPSULE | Freq: Every morning | ORAL | 0 refills | Status: DC

## 2024-02-25 MED ORDER — BUPROPION HCL ER (XL) 150 MG PO TB24: 150.0000 mg | ORAL_TABLET | Freq: Every morning | ORAL | 0 refills | Status: DC

## 2024-02-25 MED ORDER — GABAPENTIN 300 MG PO CAPS: 300.0000 mg | ORAL_CAPSULE | Freq: Three times a day (TID) | ORAL | 0 refills | Status: DC

## 2024-02-25 MED ORDER — LAMOTRIGINE 200 MG PO TABS: 200.0000 mg | ORAL_TABLET | Freq: Every day | ORAL | 0 refills | Status: DC

## 2024-02-25 NOTE — Progress Notes (Cosign Needed Addendum)
 Virtual Visit via Video Note   I connected with Patricia Rivas on 02/25/2024,  8:00 AM EDT by a video enabled telemedicine application and verified that I am speaking with the correct person using two identifiers.   Location: Patient: Home  Provider: Clinic   I discussed the limitations of evaluation and management by telemedicine and the availability of in person appointments. The patient expressed understanding and agreed to proceed.   Follow Up Instructions:   I discussed the assessment and treatment plan with the patient. The patient was provided an opportunity to ask questions and all were answered. The patient agreed with the plan and demonstrated an understanding of the instructions.   The patient was advised to call back or seek an in-person evaluation if the symptoms worsen or if the condition fails to improve as anticipated.   Patricia Mathison, DO Psych Resident, PGY-3  [facility] MD Outpatient Progress Note  02/25/2024 12:34 PM Patricia Rivas  MRN: [id number]  Assessment:  Patricia Rivas presents for follow-up evaluation in-person.   Identifying Information: Patricia Rivas is a 43 y.o. female with a PMH of MDD w luteal worsening vs unspecified mood d/o, GAD, polysubstance use d/o in sustained remission (2011, bzo, etoh, opioids, stimulant, cannabis, nic), no suicide attempts or inpatient psych admission, who is an established patient with [facility] for management of mood d/o.   Risk Assessment: An assessment of suicide and violence risk factors was performed as part of this evaluation and is not significantly changed from the last visit.             While future psychiatric events cannot be accurately predicted, the patient does not currently require acute inpatient psychiatric care and does not currently meet [place]  involuntary commitment criteria.          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 She is experiencing more of the somatic sxs of anxiety than cognitive, her ruminating thoughts are more negative world view than stress and worry.  Goal is to control mood sxs with ssri per above and taper off of gabapentin , especially given her h/o etoh and bzo use. She is aware and understands this.  Interventions: Therapy: Patricia Dago, LCSW  Labs: resulted 11/26/2023 wnl DECREASED home Gabapentin  300 mg QID to 300 mg TID INCREASED home cymbalta  30 mg to 60 mg qAM (i5/02/2024)  Continued home propranolol  10 mg TID PRN  # MDD with luteal worsening (Previously BiPD2, PMDD, unspecified mood d/o) Past medication trials: zoloft, prozac, celexa , wellbutrin , lamictal , Celexa  Status of problem: improving Denied depressed mood at 02/2024 appointment. If this continues over the next few months, consider changing dx to MDD in partial remission.  Interventions: SNRI per above Continued home wellbutrin  XL 150 mg qAM Continued home lamictal  200 mg qAM  Health Maintenance PCP: Redmon, Patricia Rivas @ [facility]  OSA on CPAP nightly, per pulm HLD: Crestor 20 mg daily per PCP H/O gastric sleeve (2021): vitamin E, MV, vitamin D   Return to care in: Future Appointments  Date Time Provider Department Center  02/27/2024  8:00 AM Patricia Dale, Merleen Stare, LCSW [facility] None  03/12/2024  8:00 AM Patricia Dale, Merleen Stare, LCSW [facility] None  03/31/2024  9:30 AM Patricia Fehnel, DO [facility] None  Patient was given contact information for behavioral health clinic and was instructed to call 911 for emergencies.    Patient and plan of care will be discussed with the Attending MD, who agrees with the above statement and plan.   Subjective:  Chief Complaint:  Chief Complaint   Patient presents with   Medication Management   Anxiety   Stress   Interval History:  PDMP gabapentin  300 mg 360qty x90d - 12/11/2023  Presented unaccompanied  Adherent to plan: Continued home wellbutrin  XL 150 mg daily Continued home lamictal  200 mg daily DC home celexa  10 mg daily Initially sent to the pharmacy [facility]  10 mg daily (s4/04/2024), but insurance doesn't cover so sent in cymbalta  instead as discussed during last appointment Continued home Gabapentin  300 mg QID Continued home propranolol  10 mg TID PRN  No new meds or vitamins or dx since last encounter.  Transition from celexa  to cymbalta  went smoothly, no side effects.   Mood: "irritability has lessened dramatically" -can tell immediately the improvement, feels like her anxiety is almost gone -only needed propranolol  prn 1x/d for breakthrough anxiety mid-day.  Hot flashes improved but night sweats have worsened  Sleep: No issues falling asleep. Sleep is disturbed by night sweats, which has been improving some. Falls back to sleep within ~23mins.  -feels more energy during daytime as well  Appetite: good, normalized, not overeating anymore.  Discussed since she is feeling better, lets go down on the gabapentin  and maximize the cymbalta  also because it is a scheduled rx. She's ok with this and does like the idea of decreasing the amount of meds to take. Discussed with her to use the propranolol  more if decreasing gabapentin  makes her anxious. Also will be going up on the cymbalta  as well.   Otherwise patient had no other questions or concerns and was amenable to plan per above.  Safety: Denied active and passive SI, HI, AVH.  Review of Systems  Constitutional:  Negative for fatigue.  Respiratory:  Negative for shortness of breath.   Cardiovascular:  Negative for chest pain.  Gastrointestinal:  Negative for abdominal pain, constipation and diarrhea.  Endocrine:       Hot flashes and night sweats   Neurological:  Negative for dizziness, tremors, seizures and light-headedness.   Visit Diagnosis:    ICD-10-CM   1. GAD (generalized anxiety disorder)  F41.1 gabapentin  (NEURONTIN ) 300 MG capsule    propranolol  (INDERAL ) 10 MG tablet    DULoxetine  (CYMBALTA ) 60 MG capsule    buPROPion  (WELLBUTRIN  XL) 150 MG 24 hr tablet    2. MDD (major depressive disorder), recurrent episode, with atypical features ([name])  F33.9 DULoxetine  (CYMBALTA ) 60 MG capsule    3. PMDD (premenstrual dysphoric disorder)  F32.81 lamoTRIgine  (LAMICTAL ) 200 MG tablet    4. Bipolar II disorder ([name])  F31.81 lamoTRIgine  (LAMICTAL ) 200 MG tablet    5. MDD (major depressive disorder), recurrent episode, with atypical features, with luteal worsening ([name])  F33.9 buPROPion  (WELLBUTRIN  XL) 150 MG 24 hr tablet    6. Polysubstance dependence in early, early partial, sustained full, or sustained partial remission ([name])  F19.21 buPROPion  (WELLBUTRIN  XL) 150 MG 24 hr tablet     Past Psychiatric History:  Diagnoses:  MDD with luteal worsening and atypical features (previously PMDD, BiPD2, unspecified mood disorder), GAD, panic d/o. acute stress reaction Historical dx of BiPD2 however on further review symptoms may be better explained by hormonal fluctuation + MDD with atypical features, and past substance use as mood symptoms in  the past were most severe during active use or right before the onset of her menstrual cycle. Then resolved with combined OCP, which is not typical in hypo-/mania.  Nicotine use d/o, AUD, OUD, stimulant use d/o, cannabis use d/o, benzodiazepine use d/o - sustained remission since - sustained remission since 2011   Medication trials:  Zoloft (2012, up to 100 mg was drinking at the time), prozac - unsure why she stopped those, she suspect side effects, was drinking at the time.  Largely unable to remember any other past psych meds. Celexa  (dc 01/2024 for sexual side effects at >10 mg and only partial  effectiveness) Cymbalta  (s4/2025 - current, partially helpful at 30 mg) Previous psychiatrist/therapist:  Seein psychiatry since around 43yo. Fulton Job, MD [phone]). [place] [facility] (2024-current) Hospitalizations: Denied Suicide attempts: Denied Hx of violence towards others: remotely when on substances Current access to guns: Denied Hx of trauma/abuse: see above  Substance Use History: EtOH: remission Nicotine:  reports that she quit smoking about 5 years ago. Her smoking use included cigarettes. She started smoking about 20 years ago. She has a 3.8 pack-year smoking history. She has never used smokeless tobacco. 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  Head trauma: Denied Seizures: Denied Allergies: Patient has no known allergies.   Family Psychiatric History:  Suicide: Maternal aunt attempted suicide x3. Friend completed suicide 07/2023.  Husband's friend overdosed 2023.  Homicide: Denied Psych hospitalization: Paternal great grandma-[place] for unclear reasons, maternal aunt BiPD: Maternal  aunt SCZ/SCzA: Denied Substance use: Maternal grandma alcohol use disorder, dad with depression and anxiety qAM  Social History:  Housing: Living in [place], [place] with husband, daughter, dogs x2, chickens x6 Income: employed - Microbiologist at [employer] Education: GED and BA in hx in 2009  Marital Status: Married Children: Therapist, art (born 2014) Support: mom, husband, friends Legal: Denied DUI/DWI: Denied Jail/prison: Denied  Past Medical History:  Past Medical History:  Diagnosis Date   Alcohol dependence in remission ([name])    Anxiety    Anxiety state 05/11/2013   IMO SNOMED Dx Update Oct 2024     Depression    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  Contractions started within 2 hrs of rupture . Cx was closed with 80 % effaced. Magnesium  started to allow for steroids     Seasonal allergies    Sleep apnea    Thrombocytopenia ([name])    Thrombocytopenia, unspecified ([name]) 12/03/2012  Past Surgical History:  Procedure Laterality Date   LAPAROSCOPIC GASTRIC SLEEVE RESECTION  06/14/2020   WISDOM TOOTH EXTRACTION     Family History:  Family History  Problem Relation Age of Onset   Depression Mother    Cancer Father        Prostate, colon   Depression Father    Cancer Maternal Aunt        Breast   Depression Maternal Aunt    Breast cancer Maternal Aunt    Depression Maternal Uncle    Breast cancer Maternal Uncle    Depression Paternal Aunt    Depression Paternal Uncle    Bipolar disorder Maternal Grandmother    Alcohol abuse  Maternal Grandmother    Breast cancer Cousin    Bipolar disorder Cousin    Social History   Socioeconomic History   Marital status: Married    Spouse name: Not on file   Number of children: Not on file   Years of education: Not on file   Highest education level: Not on file  Occupational History   Not on file  Tobacco Use   Smoking status: Former    Current packs/day: 0.00    Average packs/day: 0.3 packs/day for 15.0 years (3.8 ttl pk-yrs)    Types: Cigarettes    Start date: 01/05/2004    Quit date: 01/05/2019    Years since quitting: 5.1   Smokeless tobacco: Never  Vaping Use   Vaping status: Never Used  Substance and Sexual Activity   Alcohol use: No    Alcohol/week: 0.0 standard drinks of alcohol    Comment: quit 11/2009   Drug use: Yes    Types: Cocaine, Hydromorphone, Hydrocodone, Oxycodone , Benzodiazepines, Marijuana    Comment: quit all in 2009   Sexual activity: Yes    Partners: Male  Other Topics Concern   Not on file  Social History Narrative   Not on file   Social Drivers of Health   Financial Resource Strain: Not on file  Food Insecurity: No Food Insecurity (06/14/2020)   Received from [facility] visits prior to 12/23/2022., [facility] visits prior to 12/23/2022.   Hunger Vital Sign    Worried About Programme researcher, broadcasting/film/video in the Last Year: Never true    Ran Out of Food in the Last Year: Never true  Transportation Needs: Not on file  Physical Activity: Not on file  Stress: Not on file  Social Connections: Not on file    Allergies: No Known Allergies  Current Medications: Current Outpatient Medications  Medication Sig Dispense Refill   Biotin 81191 MCG TABS Take 1 tablet by mouth.     buPROPion  (WELLBUTRIN  XL) 150 MG 24 hr tablet Take 1 tablet (150 mg total) by mouth every morning. 60 tablet 0   cholecalciferol (VITAMIN D3) 25 MCG (1000 UNIT) tablet Take 1,000 Units by mouth daily.     DULoxetine  (CYMBALTA ) 60  MG capsule Take 1 capsule (60 mg total) by mouth every morning. 60 capsule 0   gabapentin  (NEURONTIN ) 300 MG capsule Take 1 capsule (300 mg total) by mouth 3 (three) times daily. 180 capsule 0   lamoTRIgine  (LAMICTAL ) 200 MG tablet Take 1 tablet (200 mg total) by mouth daily. 60 tablet 0   Multiple Vitamin (MULTI VITAMIN) TABS Take 5 mg by mouth at bedtime as needed.     norgestimate-ethinyl estradiol (ORTHO-CYCLEN) 0.25-35 MG-MCG tablet Take 1 tablet by mouth daily.     propranolol  (  INDERAL ) 10 MG tablet Take 1 tablet (10 mg total) by mouth every 8 (eight) hours as needed. 180 tablet 0   rosuvastatin (CRESTOR) 20 MG tablet Take 20 mg by mouth daily.     vitamin E 200 UNIT capsule Take 200 Units by mouth daily.     No current facility-administered medications for this visit.    Objective: Psychiatric Specialty Exam: There were no vitals taken for this visit.There is no height or weight on file to calculate BMI.  General Appearance: Casual, fairly groomed, pleasant, engaged  Eye Contact:  Good    Speech:  Clear, coherent, normal rate, spontaneous  Volume:  Normal   Mood:  see above  Affect:  Appropriate, congruent, full range, brighter appearing  Thought Content: Logical, rumination  Suicidal Thoughts: see above  Thought Process:  Coherent, goal-directed, linear   Orientation:  A&Ox4   Memory:  Immediate good  Judgment:  Good   Insight:  Good   Concentration:  Attention and concentration good   Recall:  Good  Fund of Knowledge: Good  Language: Good, fluent  Psychomotor Activity: Normal  Akathisia:  NA   AIMS (if indicated): NA   Assets:  Communication Skills Desire for Improvement Financial Resources/Insurance Housing Intimacy Physical Health Resilience Social Support Talents/Skills Transportation Vocational/Educational  ADL's:  Intact  Cognition: WNL  Sleep:  see above    Physical Exam Vitals and nursing note reviewed.  Constitutional:      General: She is awake.  She is not in acute distress.    Appearance: She is not ill-appearing, toxic-appearing or diaphoretic.  HENT:     Head: Normocephalic.  Eyes:     Conjunctiva/sclera: Conjunctivae normal.  Pulmonary:     Effort: Pulmonary effort is normal. No respiratory distress.  Neurological:     Mental Status: She is alert and oriented to person, place, and time.   Metabolic Disorder Labs: No results found for: "HGBA1C", "MPG" No results found for: "PROLACTIN" No results found for: "CHOL", "TRIG", "HDL", "CHOLHDL", "VLDL", "LDLCALC" Lab Results  Component Value Date   TSH 0.702 11/26/2023   Therapeutic Level Labs: Lab Results  Component Value Date   LITHIUM  0.40 (L) 03/09/2015   No results found for: "VALPROATE" No results found for: "CBMZ"  Screenings: PHQ2-9    Flowsheet Row [facility] Video Visit from 02/15/2023 in [facility] Video Visit from 11[facility]23/2022 in [facility] Video Visit from 08/31/2022 in [facility] Video Visit from 06/29/2022 in [facility]  PHQ-2 Total Score 1 0 0 0 0      Flowsheet Row Video Visit from 03[facility]29/2023 in [facility] Video Visit from 02/15/2023 in [facility] Video Visit from 11[facility]23/2022 in [facility]  C-SSRS RISK CATEGORY No Risk No Risk No Risk      Patient/Guardian was advised Release of Information must be obtained prior to any record release in order to collaborate their care with an outside provider. Patient/Guardian was advised if they have not already done so to contact the registration department to sign all necessary forms in order for us  to release information regarding their care.   Consent: Patient/Guardian gives  verbal consent for treatment and assignment of benefits for services provided during this visit. Patient/Guardian expressed understanding and agreed to proceed.   Jordan Pardini, DO Psych Resident, PGY-3

## 2024-02-27 ENCOUNTER — Encounter (HOSPITAL_COMMUNITY): Payer: Self-pay

## 2024-02-27 ENCOUNTER — Ambulatory Visit (HOSPITAL_COMMUNITY): Admitting: Licensed Clinical Social Worker

## 2024-03-12 ENCOUNTER — Encounter (HOSPITAL_COMMUNITY): Payer: Self-pay | Admitting: Licensed Clinical Social Worker

## 2024-03-12 ENCOUNTER — Ambulatory Visit (INDEPENDENT_AMBULATORY_CARE_PROVIDER_SITE_OTHER): Admitting: Licensed Clinical Social Worker

## 2024-03-12 DIAGNOSIS — F411 Generalized anxiety disorder: Secondary | ICD-10-CM

## 2024-03-12 NOTE — Progress Notes (Signed)
 Virtual Visit via Video Note  I connected with Marieke L Chilcott on 03/12/24 at  8:00 AM EDT by a video enabled telemedicine application and verified that I am speaking with the correct person using two identifiers.  Location: Patient: home Provider: home office   I discussed the limitations of evaluation and management by telemedicine and the availability of in person appointments. The patient expressed understanding and agreed to proceed.   I discussed the assessment and treatment plan with the patient. The patient was provided an opportunity to ask questions and all were answered. The patient agreed with the plan and demonstrated an understanding of the instructions.   The patient was advised to call back or seek an in-person evaluation if the symptoms worsen or if the condition fails to improve as anticipated.  I provided 55 minutes of non-face-to-face time during this encounter.   Seldon Dago, LCSW   THERAPIST PROGRESS NOTE  Session Time: 8:00am-8:55am  Participation Level: Active  Behavioral Response: NeatAlertEuthymic and stressed  Type of Therapy: Individual Therapy  Treatment Goals addressed: "Address my own feelings about my past behaviors and forgive myself so I can have better relationships with my child and husband".  Brad will report improvement in her mood, behaviors, and her relationships 5 out of 7 days.      ProgressTowards Goals: Progressing  Interventions: CBT  Summary: SHANDEE JERGENS is a 43 y.o. female who presents with GAD.   Suicidal/Homicidal: Nowithout intent/plan  Therapist Response: Shaylie engaged well in individual virtual session with clinician. Clinician utilized CBT to process thoughts, feelings, and interactions. Clinician processed updates in family and explored relationship between [place] and daughter. Clinician reflected the challenges raising an adolescent girl while she is going through perimenopause. Clinician also  explored relationship between [place] and mother, as well as Malaia and her step mother. Clinician identified the strengths and concerted effort to make her child's life more stable and less reactive. Clinician noted that emotional safety means that sometimes daughter will lash out because there is no fear of abandonment or extreme/violent backlash. Clinician discussed ways to reframe daughter's behavior and to recognize that the "attitude" from daughter is not meant to be hurtful, but it activates the inner adolescent girl in Max. Clinician reflected the challenges and normalized this experience.   Plan: Return again in 3-4 weeks.  Diagnosis: GAD (generalized anxiety disorder)  Collaboration of Care: Patient refused AEB none required  Patient/Guardian was advised Release of Information must be obtained prior to any record release in order to collaborate their care with an outside provider. Patient/Guardian was advised if they have not already done so to contact the registration department to sign all necessary forms in order for us  to release information regarding their care.   Consent: Patient/Guardian gives verbal consent for treatment and assignment of benefits for services provided during this visit. Patient/Guardian expressed understanding and agreed to proceed.   Merleen Stare [place], LCSW 03/12/2024

## 2024-03-31 ENCOUNTER — Telehealth (HOSPITAL_BASED_OUTPATIENT_CLINIC_OR_DEPARTMENT_OTHER): Admitting: Student

## 2024-03-31 ENCOUNTER — Encounter (HOSPITAL_COMMUNITY): Payer: Self-pay | Admitting: Student

## 2024-03-31 DIAGNOSIS — F339 Major depressive disorder, recurrent, unspecified: Secondary | ICD-10-CM | POA: Diagnosis not present

## 2024-03-31 DIAGNOSIS — F3281 Premenstrual dysphoric disorder: Secondary | ICD-10-CM

## 2024-03-31 DIAGNOSIS — F411 Generalized anxiety disorder: Secondary | ICD-10-CM | POA: Diagnosis not present

## 2024-03-31 DIAGNOSIS — F1921 Other psychoactive substance dependence, in remission: Secondary | ICD-10-CM

## 2024-03-31 DIAGNOSIS — F3181 Bipolar II disorder: Secondary | ICD-10-CM

## 2024-03-31 MED ORDER — BUPROPION HCL ER (XL) 150 MG PO TB24: 150.0000 mg | ORAL_TABLET | Freq: Every morning | ORAL | 0 refills | Status: DC

## 2024-03-31 MED ORDER — DULOXETINE HCL 60 MG PO CPEP: 60.0000 mg | ORAL_CAPSULE | Freq: Every morning | ORAL | 0 refills | Status: DC

## 2024-03-31 MED ORDER — LAMOTRIGINE 200 MG PO TABS: 200.0000 mg | ORAL_TABLET | Freq: Every morning | ORAL | 0 refills | Status: DC

## 2024-03-31 MED ORDER — GABAPENTIN 100 MG PO CAPS
100.0000 mg | ORAL_CAPSULE | Freq: Three times a day (TID) | ORAL | 2 refills | Status: DC
Start: 2024-03-31 — End: 2024-06-04

## 2024-03-31 NOTE — Progress Notes (Signed)
 Virtual Visit via Video Note   I connected with Patricia Rivas on 03/31/2024,  9:30 AM EDT by a video enabled telemedicine application and verified that I am speaking with the correct person using two identifiers.   Location: Patient: Home  Provider: Clinic   I discussed the limitations of evaluation and management by telemedicine and the availability of in person appointments. The patient expressed understanding and agreed to proceed.   Follow Up Instructions:   I discussed the assessment and treatment plan with the patient. The patient was provided an opportunity to ask questions and all were answered. The patient agreed with the plan and demonstrated an understanding of the instructions.   The patient was advised to call back or seek an in-person evaluation if the symptoms worsen or if the condition fails to improve as anticipated.   Lorne Winkels, DO Psych Resident, PGY-3  [facility] MD Outpatient Progress Note  03/31/2024 12:00 PM IMAGINE NEST  MRN: [id number]  Assessment:  Patricia Rivas presents for follow-up evaluation in-person.   Identifying Information: Patricia Rivas is a 43 y.o. female with a PMH of MDD w luteal worsening, GAD, polysubstance use d/o in sustained remission (2011, bzo, etoh, opioids, stimulant, cannabis, nic), no suicide attempts or inpatient psych admission, who is an established patient with [facility] for management of mood d/o.   Risk Assessment: An assessment of suicide and violence risk factors was performed as part of this evaluation and is not significantly changed from the last visit.             While future psychiatric events cannot be accurately predicted, the patient does not currently require acute inpatient psychiatric care and does not currently meet [employer]  involuntary commitment criteria.          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 She is aware and understands this. 03/31/2024: Improved anxiety sxs with increased cymbalta , and did not notice a change after decreasing gabapentin  QIT to TID as well. Initial side effect of constipation for a few weeks, but this is improving some. Still some residual anxiety, but mainly situational and she is able to get over it. Did use propranolol  ~3x for anxiety on top of the propranolol  that she takes daily in the morning for work related anxiety. Given improvement will hold off on anymore med changes. Could consider going down on gabapentin  every 2-3 months.  Gave her more control of the gabapentin  taper by providing 100 mg capsules rather than 300 mg, (max daily dose is 900 mg) for her to self-titrate down slowly if she chooses so, but plan is to stay at 300 mg TID, she would prefer to decrease by 100 mg rather than 300 mg in the future. In short no med changes.  Interventions: Therapy: Seldon Dago, LCSW  Labs: resulted 11/26/2023 wnl Continued home Gabapentin  300 mg TID (d5/02/2024)  Continued home cymbalta  60 mg qAM (i5/02/2024) Home propranolol  10 mg TID PRN - daily in the morning, in the afternoon PRN around 3-4x/month  # MDD, atypical with luteal worsening, partial remission Past medication trials: zoloft, prozac, celexa , wellbutrin , lamictal , Celexa  Status of problem: improving 03/31/2024: Denied depressed mood intimally at 02/2024  appointment, still denying it. Concentration, energy have all improved. She is able to enjoy things. No longer overeating. Doesn't feel restless. Still having issues with sleep, which bothers her, but this is due to hot flashes as she is peri-menopausal. This has been ongoing before starting cymbalta .  SNRI per above Continued  home wellbutrin  XL 150 mg qAM Continued home lamictal  200 mg qAM  Health Maintenance PCP: Redmon, Noelle, PA @ [facility]  OSA on CPAP nightly, per pulm HLD: Crestor 20 mg daily per PCP H/O gastric sleeve (2021): vitamin E, MV, vitamin D  Perimenopausal - hot flashes  Return to care in: Dr. Camillo Celestine mm fu in early Aug Future Appointments  Date Time Provider Department Center  04/03/2024  9:00 AM Cleve Dale, Merleen Stare, LCSW [facility] None  04/17/2024 11:15 AM Teretha Ferguson, MD [facility] None  05/26/2024 10:30 AM Joice Nares, MD [facility] None   Patient was given contact information for behavioral health clinic and was instructed to call 911 for emergencies.    Patient and plan of care will be discussed with the Attending MD, who agrees with the above statement and plan.   Subjective:  Chief Complaint:  Chief Complaint  Patient presents with   Medication Management   Follow-up   Anxiety   Interval History:  PDMP gabapentin  300 mg 90qty x90d - 03/18/2024  Accompanied by her dog  Last time: Continued home wellbutrin  XL 150 mg daily Continued home lamictal  200 mg daily DECREASED home Gabapentin  300 mg QID to 300 mg TID INCREASED home cymbalta  30 mg to 60 mg qAM (i5/02/2024)  Continued home propranolol  10 mg TID PRN  Side effects of constipation, but have improved some recently. No other med side effects. Adherent to rx per above.  Mood: "fantastic, like I don't have the fatigue, i don't feel like my mood is all over the place" - didn't notice any change after decreasing the gabapentin  - still uses propranolol  every morning - uses propranolol  only 3-4x in a month for break through anxiety, still no panic attacks - she's annoyed at the hot flashes, but other than that she is happy with how the medications are going  Sleep: No issues falling asleep. Sleep is still interrupted by hot flashes, but falling back to sleep better  Appetite: good, still normal, not  stress eating  Otherwise patient had no other questions or concerns and was amenable to plan per above.  Safety: Denied active and passive SI, HI, AVH.  Review of Systems  Constitutional:  Negative for fatigue.  Respiratory:  Negative for shortness of breath.   Cardiovascular:  Negative for chest pain.  Gastrointestinal:  Negative for abdominal pain, constipation and diarrhea.  Endocrine:       Hot flashes and night sweats  Neurological:  Negative for dizziness, tremors, seizures and light-headedness.   Aware that this is the last encounter with me, went over overview of her meds, what the meds are for and the diagnosis from my eval. Gave her my reasoning for why I do not believe she has bipolar d/o (prev dx of bipd2) and that her sxs are more consistent with severe depression and anxiety. She agrees with the assessment and reassured that even if I am leaving, future treatment plan will likely not change dramatically because attending is the same. Aware that she can call the office for any medication needs or questions.    Visit Diagnosis:    ICD-10-CM   1. GAD (generalized anxiety disorder)  F41.1  gabapentin  (NEURONTIN ) 100 MG capsule    buPROPion  (WELLBUTRIN  XL) 150 MG 24 hr tablet    DULoxetine  (CYMBALTA ) 60 MG capsule    2. PMDD (premenstrual dysphoric disorder)  F32.81 lamoTRIgine  (LAMICTAL ) 200 MG tablet    3. Bipolar II disorder ([name])  F31.81 lamoTRIgine  (LAMICTAL ) 200 MG tablet    4. MDD (major depressive disorder), recurrent episode, with atypical features, with luteal worsening ([name])  F33.9 buPROPion  (WELLBUTRIN  XL) 150 MG 24 hr tablet    DULoxetine  (CYMBALTA ) 60 MG capsule    5. Polysubstance dependence in early, early partial, sustained full, or sustained partial remission ([name])  F19.21 buPROPion  (WELLBUTRIN  XL) 150 MG 24 hr tablet     Past Psychiatric History:  Diagnoses:  MDD with luteal worsening and atypical features (previously PMDD, BiPD2, unspecified mood  disorder), GAD, panic d/o. acute stress reaction Historical dx of BiPD2 however on further review symptoms may be better explained by hormonal fluctuation + MDD with atypical features, and past substance use as mood symptoms in the past were most severe during active use or right before the onset of her menstrual cycle. Then resolved with combined OCP, which is not typical in hypo-/mania.  Nicotine use d/o, AUD, OUD, stimulant use d/o, cannabis use d/o, benzodiazepine use d/o - sustained remission since - sustained remission since 2011   Medication trials:  Zoloft (2012, up to 100 mg was drinking at the time), prozac - unsure why she stopped those, she suspect side effects, was drinking at the time.  Largely unable to remember any other past psych meds. Celexa  (dc 01/2024 for sexual side effects at >10 mg and only partial effectiveness) Cymbalta  (s4/2025 - current, no depressed mood and partially helpful for anxiety at 60 mg) Previous psychiatrist/therapist:  Seein psychiatry since around 43yo. Fulton Job, MD [phone]). [facility] (2024-current) Hospitalizations: Denied Suicide attempts: Denied Hx of violence towards others: remotely when on substances Current access to guns: Denied Hx of trauma/abuse: see above  Substance Use History: EtOH: remission Nicotine:  reports that she quit smoking about 5 years ago. Her smoking use included cigarettes. She started smoking about 20 years ago. She has a 3.8 pack-year smoking history. She has never used smokeless tobacco. 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  Head trauma: Denied Seizures: Denied Allergies: Patient has no known allergies.   Family Psychiatric History:  Suicide: Maternal aunt attempted suicide x3. Friend completed suicide 07/2023.  Husband's friend overdosed 2023.  Homicide: Denied Psych hospitalization: Paternal great grandma-[place] for unclear reasons, maternal aunt BiPD: Maternal aunt SCZ/SCzA: Denied Substance use: Maternal grandma alcohol use disorder, dad with depression and anxiety qAM  Social History:  Housing: Living in [place], [place] with husband, daughter, dogs x2, chickens x6 Income: employed - Microbiologist at [employer] Education: GED and BA in hx in 2009  Marital Status: Married Children: Daugher (born 2014)  Support: mom, husband, friends Legal: Denied DUI/DWI: Denied Jail/prison: Denied  Past Medical History:  Past Medical History:  Diagnosis Date   Alcohol dependence in remission ([name])    Anxiety    Anxiety state 05/11/2013   IMO SNOMED Dx Update Oct 2024     Depression    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  Contractions started within 2 hrs of rupture . Cx was closed with 80 % effaced. Magnesium  started to allow for steroids     Seasonal allergies    Sleep apnea    Thrombocytopenia ([name])    Thrombocytopenia, unspecified ([name]) 12/03/2012    Past Surgical History:  Procedure Laterality Date   LAPAROSCOPIC GASTRIC SLEEVE RESECTION  06/14/2020   WISDOM TOOTH EXTRACTION     Family History:  Family History  Problem Relation Age of Onset   Depression Mother    Cancer Father        Prostate, colon   Depression Father    Cancer Maternal Aunt        Breast   Depression Maternal Aunt    Breast cancer Maternal Aunt    Depression Maternal Uncle    Breast cancer Maternal Uncle    Depression Paternal Aunt    Depression Paternal Uncle    Bipolar disorder Maternal Grandmother    Alcohol abuse Maternal Grandmother    Breast cancer Cousin    Bipolar disorder Cousin    Social History   Socioeconomic History   Marital status: Married    Spouse name: Not on file   Number of children: Not on file   Years of education: Not on file   Highest education level: Not on file  Occupational History   Not on file  Tobacco Use   Smoking status: Former    Current packs/day: 0.00    Average packs/day: 0.3 packs/day for 15.0 years (3.8 ttl pk-yrs)    Types: Cigarettes    Start date: 01/05/2004    Quit date: 01/05/2019    Years since quitting: 5.2   Smokeless tobacco: Never  Vaping Use   Vaping status: Never Used  Substance and Sexual Activity   Alcohol use: No    Alcohol/week: 0.0 standard drinks of alcohol    Comment: quit 11/2009   Drug use: Yes    Types: Cocaine, Hydromorphone, Hydrocodone, Oxycodone , Benzodiazepines, Marijuana    Comment: quit all in 2009   Sexual activity: Yes    Partners: Male  Other Topics Concern   Not on file  Social History Narrative   Not on file   Social Drivers of Health   Financial Resource Strain: Not on file   Food Insecurity: No Food Insecurity (06/14/2020)   Received from [facility] prior to 12/23/2022., [facility] visits prior to 12/23/2022.   Hunger Vital Sign    Worried About Running Out of Food in the Last Year: Never true    Ran Out of Food in the Last Year: Never true  Transportation Needs: Not on file  Physical Activity: Not on file  Stress: Not on file  Social Connections: Not on file   Allergies: No Known Allergies  Current Medications: Current Outpatient Medications  Medication Sig Dispense Refill   Biotin 82956 MCG TABS Take 1 tablet by mouth.     buPROPion  (WELLBUTRIN  XL) 150 MG 24 hr tablet Take 1 tablet (150 mg total) by mouth every morning. 90 tablet 0   DULoxetine  (CYMBALTA ) 60 MG capsule Take 1 capsule (60 mg total) by mouth every morning. 90 capsule 0   gabapentin  (NEURONTIN ) 100 MG capsule Take 1-3 capsules (100-300 mg total) by mouth 3 (three) times daily. Max 900 mg total a day 270 capsule 2   lamoTRIgine  (LAMICTAL ) 200 MG tablet Take 1 tablet (200 mg total) by mouth in the morning. 90 tablet 0   Multiple Vitamin (MULTI VITAMIN) TABS Take 5 mg by mouth at bedtime as needed.     norgestimate-ethinyl estradiol (ORTHO-CYCLEN) 0.25-35 MG-MCG tablet Take 1 tablet by mouth daily.     propranolol  (INDERAL ) 10 MG tablet Take 1 tablet (10 mg total) by mouth every 8 (eight) hours as needed. 180 tablet 0   rosuvastatin (CRESTOR) 20 MG tablet Take 20 mg by mouth daily.     No current facility-administered medications for this visit.    Objective: Psychiatric Specialty Exam: There were no vitals taken for this visit.There is no height or weight on file to calculate BMI.  General Appearance: Casual, fairly groomed, pleasant, engaged  Eye Contact:  Good    Speech:  Clear, coherent, normal rate, spontaneous  Volume:  Normal   Mood:  see above  Affect:  Appropriate, congruent, full range, bright and happy appearing  Thought  Content: Logical, rumination  Suicidal Thoughts: see above  Thought Process:  Coherent, goal-directed, linear   Orientation:  A&Ox4   Memory:  Immediate good  Judgment:  Good   Insight:  Good   Concentration:  Attention and concentration good   Recall:  Good  Fund of Knowledge: Good  Language: Good, fluent  Psychomotor Activity: Normal  Akathisia:  NA   AIMS (if indicated): NA   Assets:  Communication Skills Desire for Improvement Financial Resources/Insurance Housing Intimacy Physical Health Resilience Social Support Talents/Skills Transportation Vocational/Educational  ADL's:  Intact  Cognition: WNL  Sleep:  see above    Physical Exam Vitals and nursing note reviewed.  Constitutional:      General: She is awake. She is not in acute distress.    Appearance: She is not ill-appearing, toxic-appearing or diaphoretic.  HENT:     Head: Normocephalic.  Eyes:     Conjunctiva/sclera: Conjunctivae normal.  Pulmonary:     [place]: Pulmonary effort is normal. No respiratory distress.  Neurological:     Mental Status: She is alert and oriented to person, place, and time.   Metabolic Disorder Labs: No results found for: "HGBA1C", "MPG" No results found for: "PROLACTIN" No results found for: "CHOL", "TRIG", "HDL", "CHOLHDL", "VLDL", "LDLCALC" Lab Results  Component Value Date   TSH 0.702 11/26/2023   Therapeutic Level Labs: Lab Results  Component Value Date   LITHIUM  0.40 (L) 03/09/2015   No results found for: "VALPROATE" No results found for: "CBMZ"  Screenings: PHQ2-9    Flowsheet Row [facility] Video Visit from [facility]/15/2023 [facility] Video Visit from [facility]/23[facility]2022 [facility] Video Visit from [facility]31/2022 [facility]  PHQ-2 Total Score 1 0 0 0 0      Flowsheet Row Video [facility]2023 [facility] Video Visit from [facility]15[facility]2023 [facility] Video Visit from [facility]/23[facility]2022 [facility]  C-SSRS RISK CATEGORY No Risk No Risk No Risk      Patient/Guardian was advised Release of Information must be obtained prior to any record release in order to collaborate their care with an outside provider. Patient/Guardian was advised if they have not already done so to contact the registration department to sign all necessary forms in order for us  to release information regarding their care.   Consent: Patient/Guardian gives verbal consent for treatment and assignment of benefits for services provided during this visit. Patient/Guardian expressed understanding and agreed to proceed.   Claudia Greenley, DO Psych Resident, PGY-3

## 2024-03-31 NOTE — Addendum Note (Signed)
 Addended by: Donnelly Gainer on: 03/31/2024 03:30 PM   Modules accepted: Level of Service

## 2024-04-03 ENCOUNTER — Encounter (HOSPITAL_COMMUNITY): Payer: Self-pay

## 2024-04-03 ENCOUNTER — Ambulatory Visit (HOSPITAL_COMMUNITY): Admitting: Licensed Clinical Social Worker

## 2024-04-17 ENCOUNTER — Encounter: Payer: Self-pay | Admitting: Plastic Surgery

## 2024-04-17 ENCOUNTER — Ambulatory Visit: Payer: Self-pay | Admitting: Plastic Surgery

## 2024-04-17 VITALS — BP 152/106 | HR 73 | Ht 65.0 in | Wt 211.0 lb

## 2024-04-17 DIAGNOSIS — M546 Pain in thoracic spine: Secondary | ICD-10-CM | POA: Diagnosis not present

## 2024-04-17 DIAGNOSIS — N62 Hypertrophy of breast: Secondary | ICD-10-CM | POA: Diagnosis not present

## 2024-04-17 DIAGNOSIS — M542 Cervicalgia: Secondary | ICD-10-CM

## 2024-04-17 DIAGNOSIS — N6481 Ptosis of breast: Secondary | ICD-10-CM

## 2024-04-17 NOTE — Progress Notes (Signed)
 Referring Provider Redmon, Noelle, PA [address] [place],  [place] [address]   CC:  Chief Complaint  Patient presents with   Consult           Patricia Rivas is an 43 y.o. female.  HPI: Patricia Rivas is a 43 year old female who presents today with complaints of upper back and neck pain and difficulty finding bras that fit appropriately which she attributes to the large size of her breast.  She states that she has had large breasts since she began breast development at approximately age 57.  Over the years the breast have remained large and this includes after a 100 pound weight loss from a gastric sleeve in 2021.  She is interested in surgical reduction of the size of her breasts.  She is otherwise healthy with no other breast complaints.  She is not a smoker she is not diabetic and she is not on blood thinners.  No Known Allergies  Outpatient Encounter Medications as of 04/17/2024  Medication Sig   Biotin 89999 MCG TABS Take 1 tablet by mouth.   buPROPion  (WELLBUTRIN  XL) 150 MG 24 hr tablet Take 1 tablet (150 mg total) by mouth every morning.   DULoxetine  (CYMBALTA ) 60 MG capsule Take 1 capsule (60 mg total) by mouth every morning.   gabapentin  (NEURONTIN ) 100 MG capsule Take 1-3 capsules (100-300 mg total) by mouth 3 (three) times daily. Max 900 mg total a day   lamoTRIgine  (LAMICTAL ) 200 MG tablet Take 1 tablet (200 mg total) by mouth in the morning.   Multiple Vitamin (MULTI VITAMIN) TABS Take 5 mg by mouth at bedtime as needed.   norgestimate-ethinyl estradiol (ORTHO-CYCLEN) 0.25-35 MG-MCG tablet Take 1 tablet by mouth daily.   propranolol  (INDERAL ) 10 MG tablet Take 1 tablet (10 mg total) by mouth every 8 (eight) hours as needed.   rosuvastatin (CRESTOR) 20 MG tablet Take 20 mg by mouth daily.   No facility-administered encounter medications on file as of 04/17/2024.     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  Contractions started within 2 hrs of rupture . Cx was closed with 80 % effaced. Magnesium  started to allow for steroids     Seasonal allergies    Sleep apnea    Thrombocytopenia ([name])    Thrombocytopenia, unspecified ([name]) 12/03/2012    Past Surgical History:  Procedure Laterality Date   LAPAROSCOPIC GASTRIC SLEEVE RESECTION  06/14/2020   WISDOM TOOTH EXTRACTION      Family History  Problem Relation Age of Onset   Depression Mother    Cancer Father        Prostate, colon   Depression Father    Cancer Maternal Aunt        Breast   Depression Maternal Aunt    Breast cancer Maternal Aunt  Depression Maternal Uncle    Breast cancer Maternal Uncle    Depression Paternal Aunt    Depression Paternal Uncle    Bipolar disorder Maternal Grandmother    Alcohol abuse Maternal Grandmother    Breast cancer Cousin    Bipolar disorder Cousin     Social History   Social History Narrative   Not on file     Review of Systems General: Denies fevers, chills, weight loss CV: Denies chest pain, shortness of breath, palpitations Breast: Large breasts which contribute to upper back and neck pain.  Her breast often spill from her bra when she bends over.  She denies other breast issues.  Physical Exam    04/17/2024   10:52 AM 11/21/2023   12:37 PM 11/27/2018    9:27 AM  Vitals with BMI  Height 5' 5  5' 6   Weight 211 lbs  266 lbs  BMI 35.11  42.95  Systolic 152  124  Diastolic 106  78  Pulse 73  72     Information is confidential and restricted. Go to Review Flowsheets to unlock data.    General:  No acute distress,  Alert and oriented, Non-Toxic, Normal speech and affect Breast: Patient has large pendulous breast with grade 3 ptosis.  There are no dominant masses on physical examination and the nipples are normal in appearance without evidence of nipple discharge.  Her sternal notch nipple distance on the right is 35 cm and 36 cm on the left her nipple to fold distance on the right is 15 cm and 16 cm on the left Mammogram: Mammogram performed in November 2024 was BI-RADS 1 Assessment/Plan Symptomatic macromastia: Patient has large breasts and would likely benefit from bilateral breast reduction.  I can remove 700 g per breast.  I discussed breast reductions at length with PatriciaCoffel.  I showed her the location of the incisions and we discussed the unpredictable nature of scarring and wound healing.  We discussed the risks of bleeding, infection, seroma formation.  She understands I will use drains postoperatively.  We discussed the risk of nipple loss due to nipple ischemia.  We discussed the possibility that her mammograms may be more difficult to interpret in the future.  She will need to wear a supportive compressive garment for 6 weeks postoperatively.  Postoperative restrictions include no heavy lifting greater than 20 pounds, no vigorous activity, no submerging the incisions in water for 6 weeks.  She may return to light activity as tolerated.  She will be encouraged to begin ambulating immediately after surgery to help decrease the risk of DVT.  All questions were answered to her satisfaction.  Photographs were obtained today with her consent.  Will submit her for bilateral breast reduction at her request.  Leonce KATHEE [place] 04/17/2024, 11:18 AM

## 2024-05-13 ENCOUNTER — Ambulatory Visit (INDEPENDENT_AMBULATORY_CARE_PROVIDER_SITE_OTHER): Admitting: Surgical

## 2024-05-13 ENCOUNTER — Encounter: Payer: Self-pay | Admitting: Surgical

## 2024-05-13 VITALS — BP 144/89 | HR 72 | Ht 65.0 in | Wt 210.4 lb

## 2024-05-13 DIAGNOSIS — M546 Pain in thoracic spine: Secondary | ICD-10-CM

## 2024-05-13 DIAGNOSIS — N6481 Ptosis of breast: Secondary | ICD-10-CM

## 2024-05-13 DIAGNOSIS — G4733 Obstructive sleep apnea (adult) (pediatric): Secondary | ICD-10-CM

## 2024-05-13 DIAGNOSIS — N62 Hypertrophy of breast: Secondary | ICD-10-CM

## 2024-05-13 MED ORDER — ONDANSETRON 4 MG PO TBDP: 4.0000 mg | ORAL_TABLET | Freq: Three times a day (TID) | ORAL | 0 refills | Status: DC | PRN

## 2024-05-13 MED ORDER — HYDROCODONE-ACETAMINOPHEN 5-325 MG PO TABS: 1.0000 | ORAL_TABLET | Freq: Three times a day (TID) | ORAL | 0 refills | Status: AC | PRN

## 2024-05-13 NOTE — H&P (View-Only) (Signed)
 Patient ID: Patricia Rivas, female    DOB: 1981/09/19, 43 y.o.   MRN: [id number]  Chief Complaint  Patient presents with   Pre-op Exam      ICD-10-CM   1. Macromastia  N62     2. Breast ptosis  N64.81     3. Bilateral thoracic back pain, unspecified chronicity  M54.6       History of Present Illness: Patricia Rivas is a 43 y.o.  female  with a history of macromastia.  She presents for preoperative evaluation for upcoming procedure, Bilateral Breast Reduction, scheduled for 05/30/2024 with Dr.  Waddell  The patient has not had problems with anesthesia. No history of DVT/PE.  No family history of DVT/PE.  No family or personal history of bleeding or clotting disorders.  Patient is not currently taking any blood thinners.  No history of CVA/MI.  No personal history of cancer.  No personal history of Crohn's or ulcerative colitis, COPD or asthma.  No history of varicose veins or swelling in her legs normally.  No recent changes to her health.  She is on oral birth control with estrogen.  Summary of Previous Visit: Her sternal notch nipple distance on the right is 35 cm and 36 cm on the left her nipple to fold distance on the right is 15 cm and 16 cm on the left Mammogram: Mammogram performed in November 2024 was BI-RADS 1  Estimated excess breast tissue to be removed at time of surgery: 700 grams  Job: Works from home, planning 1 week out of work.  PMH Significant for: Anxiety, GERD, history of sleep apnea, history of thrombocytopenia.  Macromastia.  She does have a history of a laparoscopic gastric sleeve resection in 2021 and wisdom tooth extraction.  Patient reports she has been feeling well lately, no recent changes to her health.  She denies any cardiac or pulmonary symptoms.  She feels well prepared for surgery.  She reports she was able to read through the consent form via MyChart prior to today's appointment and was able to fill out the consent form today.  She does not  have any specific concerns about it or questions.   Past Medical History: Allergies: No Known Allergies  Current Medications:  Current Outpatient Medications:    Biotin 10000 MCG TABS, Take 1 tablet by mouth., Disp: , Rfl:    buPROPion  (WELLBUTRIN  XL) 150 MG 24 hr tablet, Take 1 tablet (150 mg total) by mouth every morning., Disp: 90 tablet, Rfl: 0   DULoxetine  (CYMBALTA ) 60 MG capsule, Take 1 capsule (60 mg total) by mouth every morning., Disp: 90 capsule, Rfl: 0   gabapentin  (NEURONTIN ) 100 MG capsule, Take 1-3 capsules (100-300 mg total) by mouth 3 (three) times daily. Max 900 mg total a day, Disp: 270 capsule, Rfl: 2   lamoTRIgine  (LAMICTAL ) 200 MG tablet, Take 1 tablet (200 mg total) by mouth in the morning., Disp: 90 tablet, Rfl: 0   Multiple Vitamin (MULTI VITAMIN) TABS, Take 5 mg by mouth at bedtime as needed., Disp: , Rfl:    norgestimate-ethinyl estradiol (ORTHO-CYCLEN) 0.25-35 MG-MCG tablet, Take 1 tablet by mouth daily., Disp: , Rfl:    propranolol  (INDERAL ) 10 MG tablet, Take 1 tablet (10 mg total) by mouth every 8 (eight) hours as needed., Disp: 180 tablet, Rfl: 0   rosuvastatin (CRESTOR) 20 MG tablet, Take 20 mg by mouth daily., Disp: , Rfl:   Past Medical Problems: Past Medical History:  Diagnosis Date  Alcohol dependence in remission [facility])    Anxiety    Anxiety state 05/11/2013   IMO SNOMED Dx Update Oct 2024     Depression    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  Contractions started within 2 hrs of rupture . Cx  was closed with 80 % effaced. Magnesium  started to allow for steroids     Seasonal allergies    Sleep apnea    Thrombocytopenia ([name])    Thrombocytopenia, unspecified ([name]) 12/03/2012    Past Surgical History: Past Surgical History:  Procedure Laterality Date   LAPAROSCOPIC GASTRIC SLEEVE RESECTION  06/14/2020   WISDOM TOOTH EXTRACTION      Social History: Social History   Socioeconomic History   Marital status: Married    Spouse name: Not on file   Number of children: Not on file   Years of education: Not on file   Highest education level: Not on file  Occupational History   Not on file  Tobacco Use   Smoking status: Former    Current packs/day: 0.00    Average packs/day: 0.3 packs/day for 15.0 years (3.8 ttl pk-yrs)    Types: Cigarettes    Start date: 01/05/2004    Quit date: 01/05/2019    Years since quitting: 5.3   Smokeless tobacco: Never  Vaping Use   Vaping status: Never Used  Substance and Sexual Activity   Alcohol use: No    Alcohol/week: 0.0 standard drinks of alcohol    Comment: quit 11/2009   Drug use: Yes    Types: Cocaine, Hydromorphone, Hydrocodone , Oxycodone , Benzodiazepines, Marijuana    Comment: quit all in 2009   Sexual activity: Yes    Partners: Male  Other Topics Concern   Not on file  Social History Narrative   Not on file   Social Drivers of Health   Financial Resource Strain: Not on file  Food Insecurity: No Food Insecurity (06/14/2020)   Received from [facility] visits prior to 12/23/2022.   Hunger Vital Sign    Worried About Programme researcher, broadcasting/film/video in the Last Year: Never true    Ran Out of Food in the Last Year: Never true  Transportation Needs: Not on file  Physical Activity: Not on file  Stress: Not on file  Social Connections: Not on file  Intimate Partner Violence: Not on file    Family History: Family History  Problem Relation Age of Onset   Depression Mother    Cancer Father        Prostate, colon    Depression Father    Cancer Maternal Aunt        Breast   Depression Maternal Aunt    Breast cancer Maternal Aunt    Depression Maternal Uncle    Breast cancer Maternal Uncle    Depression Paternal Aunt    Depression Paternal Uncle    Bipolar disorder Maternal Grandmother    Alcohol abuse Maternal Grandmother    Breast cancer Cousin    Bipolar disorder Cousin     Review of Systems: ROS  Physical Exam: Vital Signs BP (!) 144/89 (BP Location: Left Arm, Patient Position: Sitting, Cuff Size: Large)   Pulse 72   Ht 5' 5 (1.651 m)   Wt 210 lb 6.4 oz (95.4 kg)   SpO2 97%   BMI 35.01 kg/m   Physical Exam Constitutional:      General: Not in acute distress.    Appearance: Normal appearance. Not ill-appearing.  HENT:     Head: Normocephalic and atraumatic.  Eyes:     Pupils: Pupils are equal, round Neck:     Musculoskeletal: Normal range of motion.  Cardiovascular:     Rate and Rhythm: Normal rate    Pulses: Normal pulses.  Pulmonary:     Effort: Pulmonary effort is normal. No respiratory distress.  Musculoskeletal: Normal range of motion.  Skin:    General: Skin is warm and dry.     Findings: No erythema or rash.  Neurological:     General: No focal deficit present.     Mental Status: Alert and oriented to person, place, and time. Mental status is at baseline.     Motor: No weakness.  Psychiatric:        Mood and Affect: Mood normal.        Behavior: Behavior normal.    Assessment/Plan: The patient is scheduled for bilateral breast reduction with Dr. Waddell.  Risks, benefits, and alternatives of procedure discussed, questions answered and consent obtained.    Smoking Status: Quit smoking in 2020; Counseling Given?  N/A Last Mammogram: 08/2023; Results: birads 1, negative  Caprini Score: 5; Risk Factors include: age, BMI > 25, on OCPs and length of planned surgery. Recommendation for mechanical prophylaxis. Encourage early ambulation.   Pictures obtained:  @[email or web address]   Post-op Rx sent to pharmacy: Hydrocodone , Zofran   Patient was provided with the breast reduction and General Surgical Risk consent document and Pain Medication Agreement prior to their appointment.  They had adequate time to read through the risk consent documents and Pain Medication Agreement. We also discussed them in person together during this preop appointment. All of their questions were answered to their satisfaction.  Recommended calling if they have any further questions.  Risk consent form and Pain Medication Agreement to be scanned into patient's chart.  The risk that can be encountered with breast reduction were discussed and include the following but not limited to these:  Breast asymmetry, fluid accumulation, firmness of the breast, inability to breast feed, loss of nipple or areola, skin loss, decrease or no nipple sensation, fat necrosis of the breast tissue, bleeding, infection, healing delay.  There are risks of anesthesia, changes to skin sensation and injury to nerves or blood vessels.  The muscle can be temporarily or permanently injured.  You may have an allergic reaction to tape, suture, glue, blood products which can result in skin discoloration, swelling, pain, skin lesions, poor healing.  Any of these can lead to the need for revisonal surgery or stage procedures.  A reduction has potential to interfere with diagnostic procedures.  Nipple or breast piercing can increase risks of infection.  This procedure is best done when the breast is fully developed.  Changes in the breast will continue to occur over time.  Pregnancy can alter the outcomes of previous breast reduction surgery, weight gain and weigh loss can also effect the long term appearance.    Electronically signed by: Donnice PARAS Tannia Contino, PA-C 05/13/2024 11:13 AM

## 2024-05-13 NOTE — Progress Notes (Signed)
 Patient ID: Patricia Rivas, female    DOB: 1981/09/19, 43 y.o.   MRN: [id number]  Chief Complaint  Patient presents with   Pre-op Exam      ICD-10-CM   1. Macromastia  N62     2. Breast ptosis  N64.81     3. Bilateral thoracic back pain, unspecified chronicity  M54.6       History of Present Illness: Patricia Rivas is a 43 y.o.  female  with a history of macromastia.  She presents for preoperative evaluation for upcoming procedure, Bilateral Breast Reduction, scheduled for 05/30/2024 with Dr.  Waddell  The patient has not had problems with anesthesia. No history of DVT/PE.  No family history of DVT/PE.  No family or personal history of bleeding or clotting disorders.  Patient is not currently taking any blood thinners.  No history of CVA/MI.  No personal history of cancer.  No personal history of Crohn's or ulcerative colitis, COPD or asthma.  No history of varicose veins or swelling in her legs normally.  No recent changes to her health.  She is on oral birth control with estrogen.  Summary of Previous Visit: Her sternal notch nipple distance on the right is 35 cm and 36 cm on the left her nipple to fold distance on the right is 15 cm and 16 cm on the left Mammogram: Mammogram performed in November 2024 was BI-RADS 1  Estimated excess breast tissue to be removed at time of surgery: 700 grams  Job: Works from home, planning 1 week out of work.  PMH Significant for: Anxiety, GERD, history of sleep apnea, history of thrombocytopenia.  Macromastia.  She does have a history of a laparoscopic gastric sleeve resection in 2021 and wisdom tooth extraction.  Patient reports she has been feeling well lately, no recent changes to her health.  She denies any cardiac or pulmonary symptoms.  She feels well prepared for surgery.  She reports she was able to read through the consent form via MyChart prior to today's appointment and was able to fill out the consent form today.  She does not  have any specific concerns about it or questions.   Past Medical History: Allergies: No Known Allergies  Current Medications:  Current Outpatient Medications:    Biotin 10000 MCG TABS, Take 1 tablet by mouth., Disp: , Rfl:    buPROPion  (WELLBUTRIN  XL) 150 MG 24 hr tablet, Take 1 tablet (150 mg total) by mouth every morning., Disp: 90 tablet, Rfl: 0   DULoxetine  (CYMBALTA ) 60 MG capsule, Take 1 capsule (60 mg total) by mouth every morning., Disp: 90 capsule, Rfl: 0   gabapentin  (NEURONTIN ) 100 MG capsule, Take 1-3 capsules (100-300 mg total) by mouth 3 (three) times daily. Max 900 mg total a day, Disp: 270 capsule, Rfl: 2   lamoTRIgine  (LAMICTAL ) 200 MG tablet, Take 1 tablet (200 mg total) by mouth in the morning., Disp: 90 tablet, Rfl: 0   Multiple Vitamin (MULTI VITAMIN) TABS, Take 5 mg by mouth at bedtime as needed., Disp: , Rfl:    norgestimate-ethinyl estradiol (ORTHO-CYCLEN) 0.25-35 MG-MCG tablet, Take 1 tablet by mouth daily., Disp: , Rfl:    propranolol  (INDERAL ) 10 MG tablet, Take 1 tablet (10 mg total) by mouth every 8 (eight) hours as needed., Disp: 180 tablet, Rfl: 0   rosuvastatin (CRESTOR) 20 MG tablet, Take 20 mg by mouth daily., Disp: , Rfl:   Past Medical Problems: Past Medical History:  Diagnosis Date  Alcohol dependence in remission [facility])    Anxiety    Anxiety state 05/11/2013   IMO SNOMED Dx Update Oct 2024     Depression    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  Contractions started within 2 hrs of rupture . Cx  was closed with 80 % effaced. Magnesium  started to allow for steroids     Seasonal allergies    Sleep apnea    Thrombocytopenia ([name])    Thrombocytopenia, unspecified ([name]) 12/03/2012    Past Surgical History: Past Surgical History:  Procedure Laterality Date   LAPAROSCOPIC GASTRIC SLEEVE RESECTION  06/14/2020   WISDOM TOOTH EXTRACTION      Social History: Social History   Socioeconomic History   Marital status: Married    Spouse name: Not on file   Number of children: Not on file   Years of education: Not on file   Highest education level: Not on file  Occupational History   Not on file  Tobacco Use   Smoking status: Former    Current packs/day: 0.00    Average packs/day: 0.3 packs/day for 15.0 years (3.8 ttl pk-yrs)    Types: Cigarettes    Start date: 01/05/2004    Quit date: 01/05/2019    Years since quitting: 5.3   Smokeless tobacco: Never  Vaping Use   Vaping status: Never Used  Substance and Sexual Activity   Alcohol use: No    Alcohol/week: 0.0 standard drinks of alcohol    Comment: quit 11/2009   Drug use: Yes    Types: Cocaine, Hydromorphone, Hydrocodone , Oxycodone , Benzodiazepines, Marijuana    Comment: quit all in 2009   Sexual activity: Yes    Partners: Male  Other Topics Concern   Not on file  Social History Narrative   Not on file   Social Drivers of Health   Financial Resource Strain: Not on file  Food Insecurity: No Food Insecurity (06/14/2020)   Received from [facility] visits prior to 12/23/2022.   Hunger Vital Sign    Worried About Programme researcher, broadcasting/film/video in the Last Year: Never true    Ran Out of Food in the Last Year: Never true  Transportation Needs: Not on file  Physical Activity: Not on file  Stress: Not on file  Social Connections: Not on file  Intimate Partner Violence: Not on file    Family History: Family History  Problem Relation Age of Onset   Depression Mother    Cancer Father        Prostate, colon    Depression Father    Cancer Maternal Aunt        Breast   Depression Maternal Aunt    Breast cancer Maternal Aunt    Depression Maternal Uncle    Breast cancer Maternal Uncle    Depression Paternal Aunt    Depression Paternal Uncle    Bipolar disorder Maternal Grandmother    Alcohol abuse Maternal Grandmother    Breast cancer Cousin    Bipolar disorder Cousin     Review of Systems: ROS  Physical Exam: Vital Signs BP (!) 144/89 (BP Location: Left Arm, Patient Position: Sitting, Cuff Size: Large)   Pulse 72   Ht 5' 5 (1.651 m)   Wt 210 lb 6.4 oz (95.4 kg)   SpO2 97%   BMI 35.01 kg/m   Physical Exam Constitutional:      General: Not in acute distress.    Appearance: Normal appearance. Not ill-appearing.  HENT:     Head: Normocephalic and atraumatic.  Eyes:     Pupils: Pupils are equal, round Neck:     Musculoskeletal: Normal range of motion.  Cardiovascular:     Rate and Rhythm: Normal rate    Pulses: Normal pulses.  Pulmonary:     Effort: Pulmonary effort is normal. No respiratory distress.  Musculoskeletal: Normal range of motion.  Skin:    General: Skin is warm and dry.     Findings: No erythema or rash.  Neurological:     General: No focal deficit present.     Mental Status: Alert and oriented to person, place, and time. Mental status is at baseline.     Motor: No weakness.  Psychiatric:        Mood and Affect: Mood normal.        Behavior: Behavior normal.    Assessment/Plan: The patient is scheduled for bilateral breast reduction with Dr. Waddell.  Risks, benefits, and alternatives of procedure discussed, questions answered and consent obtained.    Smoking Status: Quit smoking in 2020; Counseling Given?  N/A Last Mammogram: 08/2023; Results: birads 1, negative  Caprini Score: 5; Risk Factors include: age, BMI > 25, on OCPs and length of planned surgery. Recommendation for mechanical prophylaxis. Encourage early ambulation.   Pictures obtained:  @consult   Post-op Rx sent to pharmacy: Hydrocodone , Zofran   Patient was provided with the breast reduction and General Surgical Risk consent document and Pain Medication Agreement prior to their appointment.  They had adequate time to read through the risk consent documents and Pain Medication Agreement. We also discussed them in person together during this preop appointment. All of their questions were answered to their satisfaction.  Recommended calling if they have any further questions.  Risk consent form and Pain Medication Agreement to be scanned into patient's chart.  The risk that can be encountered with breast reduction were discussed and include the following but not limited to these:  Breast asymmetry, fluid accumulation, firmness of the breast, inability to breast feed, loss of nipple or areola, skin loss, decrease or no nipple sensation, fat necrosis of the breast tissue, bleeding, infection, healing delay.  There are risks of anesthesia, changes to skin sensation and injury to nerves or blood vessels.  The muscle can be temporarily or permanently injured.  You may have an allergic reaction to tape, suture, glue, blood products which can result in skin discoloration, swelling, pain, skin lesions, poor healing.  Any of these can lead to the need for revisonal surgery or stage procedures.  A reduction has potential to interfere with diagnostic procedures.  Nipple or breast piercing can increase risks of infection.  This procedure is best done when the breast is fully developed.  Changes in the breast will continue to occur over time.  Pregnancy can alter the outcomes of previous breast reduction surgery, weight gain and weigh loss can also effect the long term appearance.    Electronically signed by: Donnice PARAS Tannia Contino, PA-C 05/13/2024 11:13 AM

## 2024-05-15 NOTE — Progress Notes (Deleted)
 [facility] MD Outpatient Progress Note  Patricia Rivas  MRN: [id number]  Assessment:  Patricia Rivas presents for follow-up evaluation in-person.         # GAD  Past medication trials: zoloft, prozac, celexa , wellbutrin , lamictal , gabapentin , propranolol  - Therapy: Patricia JONELLE Rosser, LCSW *** -Continue home Gabapentin  300 mg TID (d5/02/2024)  -Continue home cymbalta  60 mg qAM (i5/02/2024) -Continue propranolol  10 mg TID PRN - daily in the morning, in the afternoon PRN around 3-4x/month  # MDD, atypical with luteal worsening, partial remission Past medication trials: zoloft, prozac, celexa , wellbutrin , lamictal , Celexa  Cymbalta  per above Continued home wellbutrin  XL 150 mg qAM Continued home lamictal  200 mg qAM  Patient was given contact information for behavioral health clinic and was instructed to call 911 for emergencies.    Patient and plan of care will be discussed with the Attending MD, who agrees with the above statement and plan.   Identifying Information: Patricia Rivas is a 43 y.o. female with a PMH of MDD w luteal worsening, GAD, polysubstance use d/o in sustained remission (2011, bzo, etoh, opioids, stimulant, cannabis, nic), no suicide attempts or inpatient psych admission, who is an established patient with [facility] for management of mood d/o.   Subjective:  Chief Complaint:  No chief complaint on file.  Interval History:   Patient seen ***.  Patient reports feeling *** today. Since the previous visit, ***. Regarding medications, patient notes ***. Patient reports the following adverse effects: ***.   Patient reports *** sleep, ***. Patient reports *** appetite, ***.   Patient rates anxiety a ***/10, depression a ***/10, and anger a ***/10.   Patient denies current SI, HI, and AVH. ***  Stressors include ***.   Substance use: ***   Visit Diagnosis:  No diagnosis found.  Past Psychiatric History:  Diagnoses:  MDD with luteal  worsening and atypical features (previously PMDD, BiPD2, unspecified mood disorder), GAD, panic d/o. acute stress reaction Historical dx of BiPD2 however on further review symptoms may be better explained by hormonal fluctuation + MDD with atypical features, and past substance use as mood symptoms in the past were most severe during active use or right before the onset of her menstrual cycle. Then resolved with combined OCP, which is not typical in hypo-/mania.  Nicotine use d/o, AUD, OUD, stimulant use d/o, cannabis use d/o, benzodiazepine use d/o - sustained remission since - sustained remission since 2011   Medication trials:  Zoloft (2012, up to 100 mg was drinking at the time), prozac - unsure why she stopped those, she suspect side effects, was drinking at the time.  Largely unable to remember any other past psych meds. Celexa  (dc 01/2024 for sexual side effects at >10 mg and only partial effectiveness) Cymbalta  (s4/2025 - current, no depressed mood and partially helpful for anxiety at 60 mg) Previous psychiatrist/therapist:  Seein psychiatry since around 43yo. Johnie Cahill, MD [phone]). [place] [facility] (2024-current) Hospitalizations: Denied Suicide attempts: Denied Hx of violence towards others: remotely when on substances Current access to guns: Denied Hx of trauma/abuse: see above  Substance Use History: EtOH: remission Nicotine:  reports that she quit smoking about 5 years ago. Her smoking use included cigarettes. She started smoking about 20 years ago. She has a 3.8 pack-year smoking history. She has never used smokeless tobacco. 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  Head trauma: Denied Seizures: Denied Allergies: Patient has no known allergies.   Family Psychiatric History:  Suicide: Maternal aunt attempted suicide x3. Friend completed suicide 07/2023.  Husband's friend overdosed 2023.  Homicide: Denied Psych hospitalization: Paternal great grandma-Butner for unclear reasons, maternal aunt BiPD: Maternal aunt SCZ/SCzA: Denied Substance use: Maternal grandma alcohol use disorder, dad with depression and anxiety qAM  Social History:  Housing: Living in [place], [place] with husband, daughter, dogs x2, chickens x6 Income: employed - Microbiologist at [employer] Education: GED and BA in hx in 2009  Marital Status: Married Children: Therapist, art (born 2014) Support: mom, husband, friends Legal: Denied DUI/DWI: Denied Jail/prison: Denied  Past Medical History:  Past Medical History:  Diagnosis Date   Alcohol dependence in remission ([name])    Anxiety    Anxiety state 05/11/2013   IMO SNOMED Dx Update Oct 2024     Depression    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  Contractions started within 2 hrs of rupture . Cx was closed with 80 % effaced. Magnesium  started to allow for steroids     Seasonal allergies    Sleep apnea    Thrombocytopenia ([name])    Thrombocytopenia, unspecified ([name]) 12/03/2012    Past Surgical History:  Procedure Laterality Date   LAPAROSCOPIC GASTRIC SLEEVE RESECTION  06/14/2020   WISDOM TOOTH EXTRACTION     Family History:  Family History  Problem Relation Age of Onset   Depression Mother    Cancer Father        Prostate, colon   Depression Father    Cancer Maternal Aunt        Breast   Depression Maternal Aunt    Breast cancer Maternal Aunt    Depression Maternal Uncle    Breast cancer Maternal Uncle    Depression Paternal Aunt    Depression Paternal Uncle    Bipolar disorder Maternal Grandmother    Alcohol abuse Maternal Grandmother    Breast cancer Cousin    Bipolar disorder Cousin    Social History: Living: *** Occupation: *** Relationship: *** Children: *** Support: *** Legal History: ***   Allergies: No Known Allergies  Current Medications: Current Outpatient Medications  Medication Sig Dispense Refill   Biotin 89999 MCG TABS Take 1 tablet by mouth.     buPROPion  (WELLBUTRIN  XL) 150 MG 24 hr tablet Take 1 tablet (150 mg total) by mouth every morning. 90 tablet 0   DULoxetine  (CYMBALTA ) 60 MG capsule Take 1 capsule (60 mg total) by mouth every morning. 90 capsule 0   gabapentin  (  NEURONTIN ) 100 MG capsule Take 1-3 capsules (100-300 mg total) by mouth 3 (three) times daily. Max 900 mg total a day 270 capsule 2   HYDROcodone -acetaminophen  (NORCO/VICODIN) 5-325 MG tablet Take 1 tablet by mouth every 8 (eight) hours as needed for up to 3 days for moderate pain (pain score 4-6). 9 tablet 0   lamoTRIgine  (LAMICTAL ) 200 MG tablet Take 1 tablet (200 mg total) by mouth in the morning. 90  tablet 0   Multiple Vitamin (MULTI VITAMIN) TABS Take 5 mg by mouth at bedtime as needed.     norgestimate-ethinyl estradiol (ORTHO-CYCLEN) 0.25-35 MG-MCG tablet Take 1 tablet by mouth daily.     ondansetron  (ZOFRAN -ODT) 4 MG disintegrating tablet Take 1 tablet (4 mg total) by mouth every 8 (eight) hours as needed for nausea or vomiting. 20 tablet 0   propranolol  (INDERAL ) 10 MG tablet Take 1 tablet (10 mg total) by mouth every 8 (eight) hours as needed. 180 tablet 0   rosuvastatin (CRESTOR) 20 MG tablet Take 20 mg by mouth daily.     No current facility-administered medications for this visit.    Objective:  Psychiatric Specialty Exam: General Appearance: appears at stated age, casually dressed and groomed ***  Behavior: pleasant and cooperative ***  Psychomotor Activity: no psychomotor agitation or retardation noted ***  Eye Contact: fair *** Speech: normal amount, volume and fluency ***   Mood: euthymic *** Affect: congruent, pleasant and interactive ***  Thought Process: linear, goal directed, no circumstantial or tangential thought process noted, no racing thoughts or flight of ideas *** Descriptions of Associations: intact ***  Thought Content Hallucinations: denies AH, VH , does not appear responding to stimuli *** Delusions: no paranoia, delusions of control, grandeur, ideas of reference, thought broadcasting, and magical thinking *** Suicidal Thoughts: denies SI, intention, plan *** Homicidal Thoughts: denies HI, intention, plan ***  Alertness/Orientation: alert and fully oriented ***  Insight: fair*** Judgment: fair***  Memory: intact ***  Executive Functions  Concentration: intact *** Attention Span: fair *** Recall: intact *** Fund of Knowledge: fair ***  Physical Exam *** General: Pleasant, well-appearing ***. No acute distress. Pulmonary: Normal effort. No wheezing or rales. Skin: No obvious rash or lesions. Neuro: A&Ox3.No focal deficit.  Review of  Systems *** No reported symptoms  Metabolic Disorder Labs: No results found for: HGBA1C, MPG No results found for: PROLACTIN No results found for: CHOL, TRIG, HDL, CHOLHDL, VLDL, LDLCALC Lab Results  Component Value Date   TSH 0.702 11/26/2023   Therapeutic Level Labs: Lab Results  Component Value Date   LITHIUM  0.40 (L) 03/09/2015   No results found for: VALPROATE No results found for: CBMZ  Screenings: PHQ2-9    Flowsheet Row Video Visit from [facility]/29/2023 in [facility] Video Visit from 02/15/2023 in [facility] Video Visit from 11/23/2022 in [facility] Video Visit from 08/31/2022 in [facility] Video Visit from 06/29/2022 in [facility]  PHQ-2 Total Score 1 0 0 0 0   Flowsheet Row Video Visit from 03/29/2023 in [facility] Video Visit from 02/15/2023 in [facility] Video Visit from 11/23/2022 in [facility]  C-SSRS RISK CATEGORY No Risk No Risk No Risk   Ismael Franco, MD PGY-3 Psychiatry Resident

## 2024-05-26 ENCOUNTER — Ambulatory Visit (HOSPITAL_COMMUNITY): Admitting: Psychiatry

## 2024-05-26 ENCOUNTER — Encounter (HOSPITAL_BASED_OUTPATIENT_CLINIC_OR_DEPARTMENT_OTHER): Payer: Self-pay | Admitting: Plastic Surgery

## 2024-05-30 ENCOUNTER — Ambulatory Visit (HOSPITAL_BASED_OUTPATIENT_CLINIC_OR_DEPARTMENT_OTHER): Admitting: Anesthesiology

## 2024-05-30 ENCOUNTER — Encounter (HOSPITAL_BASED_OUTPATIENT_CLINIC_OR_DEPARTMENT_OTHER): Payer: Self-pay | Admitting: Plastic Surgery

## 2024-05-30 ENCOUNTER — Other Ambulatory Visit: Payer: Self-pay

## 2024-05-30 ENCOUNTER — Ambulatory Visit (HOSPITAL_BASED_OUTPATIENT_CLINIC_OR_DEPARTMENT_OTHER)
Admission: RE | Admit: 2024-05-30 | Discharge: 2024-05-30 | Disposition: A | Discharge status: Home / Self Care | Attending: Plastic Surgery | Admitting: Plastic Surgery

## 2024-05-30 ENCOUNTER — Encounter (HOSPITAL_BASED_OUTPATIENT_CLINIC_OR_DEPARTMENT_OTHER)
Admission: RE | Disposition: A | Discharge status: Home / Self Care | Payer: Self-pay | Source: Home / Self Care | Attending: Plastic Surgery

## 2024-05-30 DIAGNOSIS — M546 Pain in thoracic spine: Secondary | ICD-10-CM | POA: Diagnosis not present

## 2024-05-30 DIAGNOSIS — N62 Hypertrophy of breast: Secondary | ICD-10-CM | POA: Insufficient documentation

## 2024-05-30 DIAGNOSIS — Z87891 Personal history of nicotine dependence: Secondary | ICD-10-CM | POA: Diagnosis not present

## 2024-05-30 DIAGNOSIS — G4730 Sleep apnea, unspecified: Secondary | ICD-10-CM | POA: Diagnosis not present

## 2024-05-30 DIAGNOSIS — N6481 Ptosis of breast: Secondary | ICD-10-CM | POA: Diagnosis not present

## 2024-05-30 DIAGNOSIS — M542 Cervicalgia: Secondary | ICD-10-CM

## 2024-05-30 DIAGNOSIS — F418 Other specified anxiety disorders: Secondary | ICD-10-CM | POA: Diagnosis not present

## 2024-05-30 DIAGNOSIS — Z419 Encounter for procedure for purposes other than remedying health state, unspecified: Secondary | ICD-10-CM

## 2024-05-30 LAB — POCT PREGNANCY, URINE: Preg Test, Ur: NEGATIVE

## 2024-05-30 SURGERY — MAMMOPLASTY, REDUCTION
Anesthesia: General | Site: Breast | Laterality: Bilateral

## 2024-05-30 MED ORDER — MIDAZOLAM HCL 2 MG/2ML IJ SOLN
INTRAMUSCULAR | Status: AC
  Filled 2024-05-30: qty 2

## 2024-05-30 MED ORDER — ACETAMINOPHEN 500 MG PO TABS
ORAL_TABLET | ORAL | Status: AC
  Filled 2024-05-30: qty 2

## 2024-05-30 MED ORDER — KETOROLAC TROMETHAMINE 30 MG/ML IJ SOLN
INTRAMUSCULAR | Status: DC | PRN
Start: 2024-05-30 — End: 2024-05-30
  Administered 2024-05-30: 30 mg via INTRAVENOUS

## 2024-05-30 MED ORDER — MIDAZOLAM HCL 5 MG/5ML IJ SOLN
INTRAMUSCULAR | Status: DC | PRN
  Administered 2024-05-30: 2 mg via INTRAVENOUS

## 2024-05-30 MED ORDER — FENTANYL CITRATE (PF) 100 MCG/2ML IJ SOLN
INTRAMUSCULAR | Status: AC
  Filled 2024-05-30: qty 2

## 2024-05-30 MED ORDER — ONDANSETRON HCL 4 MG/2ML IJ SOLN
INTRAMUSCULAR | Status: AC
  Filled 2024-05-30: qty 2

## 2024-05-30 MED ORDER — ROCURONIUM BROMIDE 100 MG/10ML IV SOLN
INTRAVENOUS | Status: DC | PRN
  Administered 2024-05-30: 60 mg via INTRAVENOUS

## 2024-05-30 MED ORDER — ALBUMIN HUMAN 5 % IV SOLN
INTRAVENOUS | Status: AC
  Filled 2024-05-30: qty 250

## 2024-05-30 MED ORDER — BUPIVACAINE HCL (PF) 0.25 % IJ SOLN
INTRAMUSCULAR | Status: AC
  Filled 2024-05-30: qty 30

## 2024-05-30 MED ORDER — DEXAMETHASONE SODIUM PHOSPHATE 10 MG/ML IJ SOLN
INTRAMUSCULAR | Status: AC
  Filled 2024-05-30: qty 1

## 2024-05-30 MED ORDER — ALBUMIN HUMAN 5 % IV SOLN
INTRAVENOUS | Status: DC | PRN
Start: 2024-05-30 — End: 2024-05-30

## 2024-05-30 MED ORDER — PROPOFOL 10 MG/ML IV BOLUS
INTRAVENOUS | Status: DC | PRN
  Administered 2024-05-30: 50 mg via INTRAVENOUS
  Administered 2024-05-30: 150 mg via INTRAVENOUS

## 2024-05-30 MED ORDER — BUPIVACAINE LIPOSOME 1.3 % IJ SUSP
INTRAMUSCULAR | Status: AC
  Filled 2024-05-30: qty 20

## 2024-05-30 MED ORDER — CHLORHEXIDINE GLUCONATE CLOTH 2 % EX PADS: 6.0000 | MEDICATED_PAD | Freq: Once | CUTANEOUS | Status: DC

## 2024-05-30 MED ORDER — CEFAZOLIN SODIUM-DEXTROSE 2-4 GM/100ML-% IV SOLN
2.0000 g | INTRAVENOUS | Status: AC
  Administered 2024-05-30: 2 g via INTRAVENOUS

## 2024-05-30 MED ORDER — PROPOFOL 500 MG/50ML IV EMUL
INTRAVENOUS | Status: DC | PRN
Start: 2024-05-30 — End: 2024-05-30
  Administered 2024-05-30: 50 ug/kg/min via INTRAVENOUS

## 2024-05-30 MED ORDER — ACETAMINOPHEN 500 MG PO TABS
1000.0000 mg | ORAL_TABLET | Freq: Once | ORAL | Status: AC
  Administered 2024-05-30: 1000 mg via ORAL

## 2024-05-30 MED ORDER — PROPOFOL 10 MG/ML IV BOLUS
INTRAVENOUS | Status: AC
  Filled 2024-05-30: qty 20

## 2024-05-30 MED ORDER — SODIUM CHLORIDE (PF) 0.9 % IJ SOLN
INTRAMUSCULAR | Status: AC
  Filled 2024-05-30: qty 50

## 2024-05-30 MED ORDER — HYDROMORPHONE HCL 1 MG/ML IJ SOLN
INTRAMUSCULAR | Status: DC | PRN
  Administered 2024-05-30 (×2): .5 mg via INTRAVENOUS

## 2024-05-30 MED ORDER — FENTANYL CITRATE (PF) 100 MCG/2ML IJ SOLN
INTRAMUSCULAR | Status: AC
Start: 2024-05-30 — End: 2024-05-30
  Filled 2024-05-30: qty 2

## 2024-05-30 MED ORDER — HYDROMORPHONE HCL 1 MG/ML IJ SOLN: 0.2500 mg | INTRAMUSCULAR | Status: DC | PRN

## 2024-05-30 MED ORDER — 0.9 % SODIUM CHLORIDE (POUR BTL) OPTIME
TOPICAL | Status: DC | PRN
  Administered 2024-05-30 (×3): 1000 mL

## 2024-05-30 MED ORDER — LIDOCAINE HCL (CARDIAC) PF 100 MG/5ML IV SOSY
PREFILLED_SYRINGE | INTRAVENOUS | Status: DC | PRN
Start: 2024-05-30 — End: 2024-05-30
  Administered 2024-05-30: 60 mg via INTRAVENOUS

## 2024-05-30 MED ORDER — HYDROMORPHONE HCL 1 MG/ML IJ SOLN
INTRAMUSCULAR | Status: AC
  Filled 2024-05-30: qty 0.5

## 2024-05-30 MED ORDER — ONDANSETRON HCL 4 MG/2ML IJ SOLN
INTRAMUSCULAR | Status: DC | PRN
  Administered 2024-05-30: 4 mg via INTRAVENOUS

## 2024-05-30 MED ORDER — DEXAMETHASONE SODIUM PHOSPHATE 4 MG/ML IJ SOLN
INTRAMUSCULAR | Status: DC | PRN
  Administered 2024-05-30: 5 mg via INTRAVENOUS

## 2024-05-30 MED ORDER — LIDOCAINE 2% (20 MG/ML) 5 ML SYRINGE
INTRAMUSCULAR | Status: AC
  Filled 2024-05-30: qty 5

## 2024-05-30 MED ORDER — PROPOFOL 500 MG/50ML IV EMUL
INTRAVENOUS | Status: AC
  Filled 2024-05-30: qty 50

## 2024-05-30 MED ORDER — ROCURONIUM BROMIDE 10 MG/ML (PF) SYRINGE
PREFILLED_SYRINGE | INTRAVENOUS | Status: AC
  Filled 2024-05-30: qty 10

## 2024-05-30 MED ORDER — SUGAMMADEX SODIUM 200 MG/2ML IV SOLN
INTRAVENOUS | Status: DC | PRN
  Administered 2024-05-30: 200 mg via INTRAVENOUS

## 2024-05-30 MED ORDER — LACTATED RINGERS IV SOLN: INTRAVENOUS | Status: DC

## 2024-05-30 MED ORDER — FENTANYL CITRATE (PF) 100 MCG/2ML IJ SOLN
INTRAMUSCULAR | Status: DC | PRN
  Administered 2024-05-30: 100 ug via INTRAVENOUS
  Administered 2024-05-30 (×2): 50 ug via INTRAVENOUS

## 2024-05-30 MED ORDER — CEFAZOLIN SODIUM-DEXTROSE 2-4 GM/100ML-% IV SOLN
INTRAVENOUS | Status: AC
  Filled 2024-05-30: qty 100

## 2024-05-30 MED ORDER — SODIUM CHLORIDE (PF) 0.9 % IJ SOLN
INTRAMUSCULAR | Status: DC | PRN
  Administered 2024-05-30: 100 mL

## 2024-05-30 SURGICAL SUPPLY — 59 items
BINDER BREAST 3XL (GAUZE/BANDAGES/DRESSINGS) IMPLANT
BINDER BREAST LRG (GAUZE/BANDAGES/DRESSINGS) IMPLANT
BINDER BREAST MEDIUM (GAUZE/BANDAGES/DRESSINGS) IMPLANT
BINDER BREAST XLRG (GAUZE/BANDAGES/DRESSINGS) IMPLANT
BINDER BREAST XXLRG (GAUZE/BANDAGES/DRESSINGS) IMPLANT
BIOPATCH RED 1 DISK 7.0 (GAUZE/BANDAGES/DRESSINGS) ×2 IMPLANT
BLADE HEX COATED 2.75 (ELECTRODE) IMPLANT
BLADE SURG 10 STRL SS (BLADE) ×6 IMPLANT
BLADE SURG 15 STRL LF DISP TIS (BLADE) ×1 IMPLANT
CANISTER SUCT 1200ML W/VALVE (MISCELLANEOUS) IMPLANT
COTTONBALL LRG STERILE PKG (GAUZE/BANDAGES/DRESSINGS) IMPLANT
DERMABOND ADVANCED .7 DNX12 (GAUZE/BANDAGES/DRESSINGS) ×4 IMPLANT
DRAIN CHANNEL 19F RND (DRAIN) ×2 IMPLANT
DRAPE IMP U-DRAPE 54X76 (DRAPES) IMPLANT
DRAPE UTILITY XL STRL (DRAPES) ×1 IMPLANT
DRSG TEGADERM 4X4.75 (GAUZE/BANDAGES/DRESSINGS) ×2 IMPLANT
ELECTRODE BLDE 4.0 EZ CLN MEGD (MISCELLANEOUS) ×1 IMPLANT
ELECTRODE REM PT RTRN 9FT ADLT (ELECTROSURGICAL) ×2 IMPLANT
EVACUATOR SILICONE 100CC (DRAIN) ×2 IMPLANT
GAUZE PAD ABD 8X10 STRL (GAUZE/BANDAGES/DRESSINGS) ×2 IMPLANT
GAUZE SPONGE 2X2 STRL 8-PLY (GAUZE/BANDAGES/DRESSINGS) ×2 IMPLANT
GAUZE XEROFORM 5X9 LF (GAUZE/BANDAGES/DRESSINGS) IMPLANT
GLOVE BIO SURGEON STRL SZ 6.5 (GLOVE) IMPLANT
GLOVE BIO SURGEON STRL SZ7.5 (GLOVE) IMPLANT
GLOVE BIO SURGEON STRL SZ8 (GLOVE) ×1 IMPLANT
GLOVE BIOGEL PI IND STRL 7.0 (GLOVE) IMPLANT
GLOVE BIOGEL PI IND STRL 8 (GLOVE) ×1 IMPLANT
GOWN STRL REUS W/ TWL LRG LVL3 (GOWN DISPOSABLE) ×1 IMPLANT
GOWN STRL REUS W/TWL XL LVL3 (GOWN DISPOSABLE) ×1 IMPLANT
HEMOSTAT ARISTA ABSORB 3G PWDR (HEMOSTASIS) IMPLANT
HIBICLENS CHG 4% 4OZ BTL (MISCELLANEOUS) ×1 IMPLANT
MANIFOLD NEPTUNE II (INSTRUMENTS) ×1 IMPLANT
MARKER SKIN DUAL TIP RULER LAB (MISCELLANEOUS) ×1 IMPLANT
NDL HYPO 22X1.5 SAFETY MO (MISCELLANEOUS) ×2 IMPLANT
NEEDLE HYPO 22X1.5 SAFETY MO (MISCELLANEOUS) ×2 IMPLANT
NS IRRIG 1000ML POUR BTL (IV SOLUTION) ×1 IMPLANT
PACK BASIN DAY SURGERY FS (CUSTOM PROCEDURE TRAY) ×1 IMPLANT
PACK UNIVERSAL I (CUSTOM PROCEDURE TRAY) ×1 IMPLANT
PENCIL SMOKE EVACUATOR (MISCELLANEOUS) ×2 IMPLANT
PIN SAFETY STERILE (MISCELLANEOUS) ×1 IMPLANT
SLEEVE SCD COMPRESS KNEE MED (STOCKING) ×1 IMPLANT
SPONGE T-LAP 18X18 ~~LOC~~+RFID (SPONGE) ×3 IMPLANT
STAPLER SKIN PROX WIDE 3.9 (STAPLE) ×1 IMPLANT
STRIP CLOSURE SKIN 1/2X4 (GAUZE/BANDAGES/DRESSINGS) IMPLANT
SUT MNCRL AB 3-0 PS2 27 (SUTURE) ×4 IMPLANT
SUT MNCRL AB 4-0 PS2 18 (SUTURE) ×4 IMPLANT
SUT MON AB 2-0 CT1 36 (SUTURE) ×1 IMPLANT
SUT MON AB 5-0 PS2 18 (SUTURE) IMPLANT
SUT SILK 2 0 SH (SUTURE) ×2 IMPLANT
SUT VIC AB 3-0 SH 27X BRD (SUTURE) IMPLANT
SYR 20ML LL LF (SYRINGE) ×2 IMPLANT
SYR BULB IRRIG 60ML STRL (SYRINGE) IMPLANT
SYR CONTROL 10ML LL (SYRINGE) ×1 IMPLANT
TAPE MEASURE VINYL STERILE (MISCELLANEOUS) IMPLANT
TOWEL GREEN STERILE FF (TOWEL DISPOSABLE) ×2 IMPLANT
TRAY DSU PREP LF (CUSTOM PROCEDURE TRAY) ×1 IMPLANT
TUBE CONNECTING 20X1/4 (TUBING) ×1 IMPLANT
UNDERPAD 30X36 HEAVY ABSORB (UNDERPADS AND DIAPERS) ×2 IMPLANT
YANKAUER SUCT BULB TIP NO VENT (SUCTIONS) ×1 IMPLANT

## 2024-05-30 NOTE — Op Note (Signed)
 DATE OF OPERATION: 05/30/2024   LOCATION: [facility] operating Room   PREOPERATIVE DIAGNOSIS: Symptomatic macromastia   POSTOPERATIVE DIAGNOSIS: Same   PROCEDURE: Bilateral breast reduction   SURGEON: Marinell Birmingham, MD   ASSISTANT: Matt Scheeler   EBL: 75 cc   CONDITION: Stable   COMPLICATIONS: None   INDICATION: The patient, Patricia Rivas, is a 43 y.o. female born on 1981-07-12, is here for treatment upper back and neck pain secondary to large breast size.     PROCEDURE DETAILS:  The patient was seen prior to surgery and marked.  IV antibiotics were given. The patient was taken to the operating room,SCDs were placed, and given a general anesthetic. A standard time out was performed and all information was confirmed by those in the room.    The chest was prepped and draped in usual sterile manner.  A 42 mm cookie cutter was used to outline the proposed nipple areolar complexes bilaterally and an 8 cm based inferior pedicle was outlined on each breast.  The right breast was addressed first.  Laparotomy tape was placed at the base of breast as a tourniquet and the pedicle was de-epithelialized sharply.  Electrocautery was used to dissect the borders of the pedicle to the chest wall.  Electrocautery was then used to resect the medial lateral and superior triangles of the breast tissue and to develop within the superior skin flap.  The breast tissue removed constituted the bulk of the reduction and weighed 707 gms.  The subfascial space over the pectoralis muscle and the subcutaneous tissues were infiltrated with 50 mL of a mixture of Exparel , quarter percent plain Marcaine , and saline.  The surgical wound was irrigated and hemostasis ensured with electrocautery.  A 19 French round drain was placed behind the pedicle and brought out through a separate stab incision.  The T point was approximated with a single 2-0 Monocryl suture and the skin edges were tailor tacked in place with  skin clips.  Dermis was approximated with interrupted and running 3-0 Monocryl sutures and the skin was closed with a running 4-0 Monocryl subcuticular stitch.  Attention was turned to the left breast where similar procedure was performed.  After placing a laparotomy tape at the base of the breast as a tourniquet the pedicle was de-epithelialized sharply and dissected to the chest wall with electrocautery.  Medial lateral and superior triangles of breast tissue were resected and the superior skin flap was developed and thinned.  The breast tissue removed constituted the bulk of the reduction and weighed 614 gms.  All breast tissue was sent to pathology for routine examination.  Again the subfascial space was infiltrated with the Exparel  mixture and the surgical bed irrigated.  After ensuring hemostasis a 19 French round drain was placed behind the pedicle and brought out through a separate stab incision. The T point was approximated with a 2-0 Monocryl suture and the skin edges were tailor tacked in place with skin clips.  Dermis was closed with interrupted and running 3-0 Monocryl sutures and skin was closed with running 4-0 Monocryl subcuticular stitch.  All incisions were then sealed with Dermabond the patient was placed in a supportive, compressive garment.  She was awakened from anesthesia without incident and transferred to the recovery room in good condition.  All instrument needle and sponge counts were reported as correct and there were no complications appreciated during the procedure. The patient was allowed to wake up and taken to recovery room in stable  condition at the end of the case. The family was notified at the end of the case.    The advanced practice practitioner (APP) assisted throughout the case.  The APP was essential in retraction and counter traction when needed to make the case progress smoothly.  This retraction and assistance made it possible to see the tissue plans for the procedure.   The assistance was needed for blood control, tissue re-approximation and assisted with closure of the incision site.

## 2024-05-30 NOTE — Anesthesia Preprocedure Evaluation (Addendum)
 Anesthesia Evaluation  Patient identified by MRN, date of birth, ID band Patient awake    Reviewed: Allergy & Precautions, H&P , NPO status , Patient's Chart, lab work & pertinent test results  Airway Mallampati: II  TM Distance: >3 FB Neck ROM: Full    Dental no notable dental hx. (+) Teeth Intact, Dental Advisory Given   Pulmonary sleep apnea , former smoker   Pulmonary exam normal breath sounds clear to auscultation       Cardiovascular negative cardio ROS  Rhythm:Regular Rate:Normal     Neuro/Psych   Anxiety Depression    negative neurological ROS     GI/Hepatic negative GI ROS,,,(+)     substance abuse    Endo/Other  negative endocrine ROS    Renal/GU negative Renal ROS  negative genitourinary   Musculoskeletal   Abdominal   Peds  Hematology negative hematology ROS (+)   Anesthesia Other Findings   Reproductive/Obstetrics negative OB ROS                              Anesthesia Physical Anesthesia Plan  ASA: 2  Anesthesia Plan: General   Post-op Pain Management: Tylenol  PO (pre-op)*, Precedex and Toradol  IV (intra-op)*   Induction: Intravenous  PONV Risk Score and Plan: 4 or greater and Ondansetron , Dexamethasone  and Midazolam   Airway Management Planned: Oral ETT  Additional Equipment:   Intra-op Plan:   Post-operative Plan: Extubation in OR  Informed Consent: I have reviewed the patients History and Physical, chart, labs and discussed the procedure including the risks, benefits and alternatives for the proposed anesthesia with the patient or authorized representative who has indicated his/her understanding and acceptance.     Dental advisory given  Plan Discussed with: CRNA  Anesthesia Plan Comments:          Anesthesia Quick Evaluation

## 2024-05-30 NOTE — Anesthesia Procedure Notes (Signed)
 Procedure Name: Intubation Date/Time: 05/30/2024 10:55 AM  Performed by: Pam Macario BROCKS, CRNAPre-anesthesia Checklist: Patient identified, Emergency Drugs available, Suction available, Patient being monitored and Timeout performed Patient Re-evaluated:Patient Re-evaluated prior to induction Oxygen Delivery Method: Circle system utilized Preoxygenation: Pre-oxygenation with 100% oxygen Induction Type: IV induction and Cricoid Pressure applied Ventilation: Mask ventilation without difficulty Laryngoscope Size: Mac and 3 Grade View: Grade III Tube type: Oral Tube size: 7.0 mm Number of attempts: 2 Airway Equipment and Method: Stylet, Oral airway and Video-laryngoscopy Placement Confirmation: ETT inserted through vocal cords under direct vision, positive ETCO2, breath sounds checked- equal and bilateral and CO2 detector Secured at: 23 cm Tube secured with: Tape Dental Injury: Teeth and Oropharynx as per pre-operative assessment

## 2024-05-30 NOTE — Interval H&P Note (Signed)
 History and Physical Interval Note: No change in exam or indication for surgery All questions answered. Marked for a bilateral breast reduction with her assistance Will proceed at her request  05/30/2024 10:08 AM  Patricia Rivas  has presented today for surgery, with the diagnosis of [id number].  The various methods of treatment have been discussed with the patient and family. After consideration of risks, benefits and other options for treatment, the patient has consented to  Procedure(s): MAMMOPLASTY, REDUCTION (Bilateral) as a surgical intervention.  The patient's history has been reviewed, patient examined, no change in status, stable for surgery.  I have reviewed the patient's chart and labs.  Questions were answered to the patient's satisfaction.     Patricia Rivas

## 2024-05-30 NOTE — Anesthesia Postprocedure Evaluation (Signed)
 Anesthesia Post Note  Patient: Patricia Rivas  Procedure(s) Performed: MAMMOPLASTY, REDUCTION (Bilateral: Breast)     Patient location during evaluation: PACU Anesthesia Type: General Level of consciousness: awake and alert Pain management: pain level controlled Vital Signs Assessment: post-procedure vital signs reviewed and stable Respiratory status: spontaneous breathing, nonlabored ventilation and respiratory function stable Cardiovascular status: blood pressure returned to baseline and stable Postop Assessment: no apparent nausea or vomiting Anesthetic complications: no   No notable events documented.  Last Vitals:  Vitals:   05/30/24 1445 05/30/24 1500  BP: 137/87 (!) (P) 142/84  Pulse: 99 (!) 108  Resp: 13 15  Temp:    SpO2: 99% 96%    Last Pain:  Vitals:   05/30/24 1500  TempSrc:   PainSc: (P) 0-No pain                 Ponciano Shealy,W. EDMOND

## 2024-05-30 NOTE — Discharge Instructions (Addendum)
 No tylenol  before 5 pm. No Ibuprofen  before 7 pm.    INSTRUCTIONS FOR AFTER BREAST SURGERY   You will likely have some questions about what to expect following your operation.  The following information will help you and your family understand what to expect when you are discharged from the hospital.  It is important to follow these guidelines to help ensure a smooth recovery and reduce complication.  Postoperative instructions include information on: diet, wound care, medications and physical activity.  AFTER SURGERY Expect to go home after the procedure.  In some cases, you may need to spend one night in the hospital for observation.  DIET Breast surgery does not require a specific diet.  However, the healthier you eat the better your body will heal. It is important to increasing your protein intake.  This means limiting the foods with sugar and carbohydrates.  Focus on vegetables and some meat.  If you have liposuction during your procedure be sure to drink water.  If your urine is bright yellow, then it is concentrated, and you need to drink more water.  As a general rule after surgery, you should have 8 ounces of water every hour while awake.  If you find you are persistently nauseated or unable to take in liquids let us  know.  NO TOBACCO USE or EXPOSURE.  This will slow your healing process and lead to a wound.  WOUND CARE Leave the binder on at all times except when showering . Use fragrance free soap like Dial, Dove or [place].   After 24 hours you can remove the binder to shower. Once dry apply binder or sports bra. No baths, pools or hot tubs for four weeks. We close your incision to leave the smallest and best-looking scar. No ointment or creams on your incisions for four weeks.  No Neosporin (Too many skin reactions).  A few weeks after surgery you can use Mederma and start massaging the scar. We ask you to wear your binder or sports bra for the first 6 weeks around the clock, including  while sleeping. This provides added comfort and helps reduce the fluid accumulation at the surgery site. NO Ice or heating pads to the operative site.  You have a very high risk of a BURN before you feel the temperature change. Continue to empty, recharge, & record drainage from drains 2-3 times a day, as needed.   ACTIVITY No heavy lifting until cleared by the doctor.  This usually means no more than a half-gallon of milk.  It is OK to walk and climb stairs. Moving your legs is very important to decrease your risk of a blood clot.  It will also help keep you from getting deconditioned.  Every 1 to 2 hours get up and walk for 5 minutes. This will help with a quicker recovery back to normal.  Let pain be your guide so you don't do too much.  This time is for you to recover.  You will be more comfortable if you sleep and rest with your head elevated either with a few pillows under you or in a recliner.  No stomach sleeping for a three months.  WORK Everyone returns to work at different times. As a rough guide, most people take at least 1 - 2 weeks off prior to returning to work. If you need documentation for your job, give the forms to the front staff at the clinic.  DRIVING Arrange for someone to bring you home from the hospital  after your surgery.  You may be able to drive a few days after surgery but not while taking any narcotics or valium.  BOWEL MOVEMENTS Constipation can occur after anesthesia and while taking pain medication.  It is important to stay ahead for your comfort.  We recommend taking Milk of Magnesia (2 tablespoons; twice a day) while taking the pain pills.  MEDICATIONS You may be prescribed should start after surgery At your preoperative visit for you history and physical you may have been given the following medications: Zofran  4 mg:  This is to treat nausea and vomiting.  You can take this every 6 hours as needed and only if needed. Oxycodone  5 mg:  This is only to be used  after you have taken the Motrin  or the Tylenol . Every 8 hours as needed.   Over the counter Medication to take: Ibuprofen  (Motrin ) 600 mg:  Take this every 6 hours.  If you have additional pain then take 500 mg of the Tylenol  every 8 hours.  Only take the Norco after you have tried these two. MiraLAX or Milk of Magnesia: Take this according to the bottle if you take the Norco.  WHEN TO CALL Call your surgeon's office if any of the following occur: Fever 101 degrees F or greater Excessive bleeding or fluid from the incision site. Pain that increases over time without aid from the medications Redness, warmth, or pus draining from incision sites Persistent nausea or inability to take in liquids Severe misshapen area that underwent the operation.  Here are some resources for breast cancer patients:  Plastic surgery website: https://www.plasticsurgery.org/for-medical-professionals/education-and-resources/publications/breast-reconstruction-magazine Breast Reconstruction Awareness Campaign:  ChessContest.fr Plastic surgery Implant information:  https://www.plasticsurgery.org/patient-safety/breast-implant-safety  About my Jackson-Pratt Bulb Drain  What is a Jackson-Pratt bulb? A Jackson-Pratt is a soft, round device used to collect drainage. It is connected to a long, thin drainage catheter, which is held in place by one or two small stiches near your surgical incision site. When the bulb is squeezed, it forms a vacuum, forcing the drainage to empty into the bulb.  Emptying the Jackson-Pratt bulb- To empty the bulb: 1. Release the plug on the top of the bulb. 2. Pour the bulb's contents into a measuring container which your nurse will provide. 3. Record the time emptied and amount of drainage. Empty the drain(s) as often as your     doctor or nurse recommends.  Date                  Time                    Amount (Drain 1)                 Amount (Drain  2)  _____________________________________________________________________  _____________________________________________________________________  _____________________________________________________________________  _____________________________________________________________________  _____________________________________________________________________  _____________________________________________________________________  _____________________________________________________________________  _____________________________________________________________________  Squeezing the Jackson-Pratt Bulb- To squeeze the bulb: 1. Make sure the plug at the top of the bulb is open. 2. Squeeze the bulb tightly in your fist. You will hear air squeezing from the bulb. 3. Replace the plug while the bulb is squeezed. 4. Use a safety pin to attach the bulb to your clothing. This will keep the catheter from     pulling at the bulb insertion site.  When to call your doctor- Call your doctor if: Drain site becomes red, swollen or hot. You have a fever greater than 101 degrees F. There is oozing at the drain site. Drain falls out (apply  a guaze bandage over the drain hole and secure it with tape). Drainage increases daily not related to activity patterns. (You will usually have more drainage when you are active than when you are resting.) Drainage has a bad odor.    Information for Discharge Teaching: EXPAREL  (bupivacaine  liposome injectable suspension)   Pain relief is important to your recovery. The goal is to control your pain so you can move easier and return to your normal activities as soon as possible after your procedure. Your physician may use several types of medicines to manage pain, swelling, and more.  Your surgeon or anesthesiologist gave you EXPAREL (bupivacaine ) to help control your pain after surgery.  EXPAREL  is a local anesthetic designed to release slowly over an extended  period of time to provide pain relief by numbing the tissue around the surgical site. EXPAREL  is designed to release pain medication over time and can control pain for up to 72 hours. Depending on how you respond to EXPAREL , you may require less pain medication during your recovery. EXPAREL  can help reduce or eliminate the need for opioids during the first few days after surgery when pain relief is needed the most. EXPAREL  is not an opioid and is not addictive. It does not cause sleepiness or sedation.   Important! A teal colored band has been placed on your arm with the date, time and amount of EXPAREL  you have received. Please leave this armband in place for the full 96 hours following administration, and then you may remove the band. If you return to the hospital for any reason within 96 hours following the administration of EXPAREL , the armband provides important information that your health care providers to know, and alerts them that you have received this anesthetic.    Possible side effects of EXPAREL : Temporary loss of sensation or ability to move in the area where medication was injected. Nausea, vomiting, constipation Rarely, numbness and tingling in your mouth or lips, lightheadedness, or anxiety may occur. Call your doctor right away if you think you may be experiencing any of these sensations, or if you have other questions regarding possible side effects.  Follow all other discharge instructions given to you by your surgeon or nurse. Eat a healthy diet and drink plenty of water or other fluids.   Post Anesthesia Home Care Instructions  Activity: Get plenty of rest for the remainder of the day. A responsible individual must stay with you for 24 hours following the procedure.  For the next 24 hours, DO NOT: -Drive a car -Advertising copywriter -Drink alcoholic beverages -Take any medication unless instructed by your physician -Make any legal decisions or sign important  papers.  Meals: Start with liquid foods such as gelatin or soup. Progress to regular foods as tolerated. Avoid greasy, spicy, heavy foods. If nausea and/or vomiting occur, drink only clear liquids until the nausea and/or vomiting subsides. Call your physician if vomiting continues.  Special Instructions/Symptoms: Your throat may feel dry or sore from the anesthesia or the breathing tube placed in your throat during surgery. If this causes discomfort, gargle with warm salt water. The discomfort should disappear within 24 hours.  If you had a scopolamine patch placed behind your ear for the management of post- operative nausea and/or vomiting:  1. The medication in the patch is effective for 72 hours, after which it should be removed.  Wrap patch in a tissue and discard in the trash. Wash hands thoroughly with soap and water. 2. You may remove the  patch earlier than 72 hours if you experience unpleasant side effects which may include dry mouth, dizziness or visual disturbances. 3. Avoid touching the patch. Wash your hands with soap and water after contact with the patch.    No tylenol  until after 3:15 today.  No ibuprofen  until after 7:30 tonight

## 2024-05-30 NOTE — Transfer of Care (Signed)
 Immediate Anesthesia Transfer of Care Note  Patient: Patricia Rivas  Procedure(s) Performed: MAMMOPLASTY, REDUCTION (Bilateral: Breast)  Patient Location: PACU  Anesthesia Type:General  Level of Consciousness: awake, alert , and oriented  Airway & Oxygen Therapy: Patient connected to face mask oxygen  Post-op Assessment: Report given to RN  Post vital signs: Reviewed and stable  Last Vitals:  Vitals Value Taken Time  BP 138/91 05/30/24 14:15  Temp    Pulse 109 05/30/24 14:19  Resp 14 05/30/24 14:19  SpO2 98 % 05/30/24 14:19  Vitals shown include unfiled device data.  Last Pain:  Vitals:   05/30/24 0909  TempSrc: Tympanic  PainSc: 0-No pain      Patients Stated Pain Goal: 7 (05/30/24 0909)  Complications: No notable events documented.

## 2024-05-31 ENCOUNTER — Encounter (HOSPITAL_BASED_OUTPATIENT_CLINIC_OR_DEPARTMENT_OTHER): Payer: Self-pay | Admitting: Plastic Surgery

## 2024-06-02 ENCOUNTER — Ambulatory Visit: Admitting: Surgical

## 2024-06-02 DIAGNOSIS — N62 Hypertrophy of breast: Secondary | ICD-10-CM

## 2024-06-02 DIAGNOSIS — M546 Pain in thoracic spine: Secondary | ICD-10-CM

## 2024-06-02 DIAGNOSIS — N6481 Ptosis of breast: Secondary | ICD-10-CM

## 2024-06-02 LAB — SURGICAL PATHOLOGY

## 2024-06-02 NOTE — Progress Notes (Signed)
 Patient is a 43 y.o.-year-old female status post bilateral breast reduction with Dr.  Waddell. Patient is 3 days postop.  She reports is overall doing well.  She has had no issues with pain control over the weekend.  She has been eating and drinking normally.  She has been having normal urination and bowel movements.  She does not have any specific concerns at this time.  The drains have had about 20 cc of output on the left per day and 5 to 7 cc of output on the right per day.  Chaperone present on exam Bilateral NAC's are viable, bilateral breast incisions are intact. There is no erythema or cellulitic changes noted. No obvious subcutaneous fluid collections noted with palpation. She does have some ecchymosis noted of bilateral breast.  No hematomas noted with palpation.  A/P:  Recommend continue to ambulate, drink plenty of fluids.  No signs of infection or concern on exam.  Recommend continuing with compressive garment 24/7 until 6 weeks post-op,  avoiding strenuous activity/heavy lifting until 6 weeks post-op  Recommend following up in 1 week  All of the patient's questions were answered to their content. Recommend calling with any questions or concerns.  Pictures were obtained of the patient and placed in the chart with the patient's or guardian's permission.

## 2024-06-04 ENCOUNTER — Other Ambulatory Visit (HOSPITAL_COMMUNITY): Payer: Self-pay | Admitting: *Deleted

## 2024-06-04 DIAGNOSIS — F411 Generalized anxiety disorder: Secondary | ICD-10-CM

## 2024-06-04 MED ORDER — GABAPENTIN 100 MG PO CAPS: 100.0000 mg | ORAL_CAPSULE | Freq: Three times a day (TID) | ORAL | 0 refills | Status: DC

## 2024-06-04 MED ORDER — PROPRANOLOL HCL 10 MG PO TABS
10.0000 mg | ORAL_TABLET | Freq: Three times a day (TID) | ORAL | 0 refills | Status: DC | PRN
Start: 2024-06-04 — End: 2024-06-30

## 2024-06-09 ENCOUNTER — Ambulatory Visit (INDEPENDENT_AMBULATORY_CARE_PROVIDER_SITE_OTHER): Admitting: Surgical

## 2024-06-09 VITALS — BP 121/83 | HR 77

## 2024-06-09 DIAGNOSIS — M546 Pain in thoracic spine: Secondary | ICD-10-CM

## 2024-06-09 DIAGNOSIS — N62 Hypertrophy of breast: Secondary | ICD-10-CM

## 2024-06-09 DIAGNOSIS — N6481 Ptosis of breast: Secondary | ICD-10-CM

## 2024-06-09 NOTE — Progress Notes (Signed)
 Patient is a 43 y.o.-year-old female status post bilateral breast reduction with Dr.  Waddell. Patient is 10 days postop.   Chaperone present on exam BP 121/83 (BP Location: Left Arm, Patient Position: Sitting, Cuff Size: Large)   Pulse 77   SpO2 97%   Patient is well-developed, well-nourished, no acute distress. Bilateral NAC's are viable, bilateral breast incisions are intact. There is no erythema or cellulitic changes noted. No obvious subcutaneous fluid collections noted with palpation. She does have ecchymosis of bilateral breast, right greater than left.  She also has a blood blister on the left inframammary T-junction.  She does still have some swelling of bilateral breast.  A/P:  Patient is doing well, no signs of infection or concern on exam.  Recommend continuing with ibuprofen  and Tylenol  as tolerated for postop pain control, use hydrocodone  as needed.  Recommend continuing with compressive garment 24/7 until 6 weeks post-op,  avoiding strenuous activity/heavy lifting until 6 weeks post-op  Recommend following up in 7 to 10 days for reevaluation  All of the patient's questions were answered to their content. Recommend calling with any questions or concerns.

## 2024-06-19 ENCOUNTER — Ambulatory Visit: Admitting: Plastic Surgery

## 2024-06-19 ENCOUNTER — Encounter: Payer: Self-pay | Admitting: Plastic Surgery

## 2024-06-19 VITALS — BP 126/84 | HR 81

## 2024-06-19 DIAGNOSIS — Z9889 Other specified postprocedural states: Secondary | ICD-10-CM

## 2024-06-19 NOTE — Progress Notes (Signed)
 Ms. Patricia Rivas returns today approximately 3 weeks postop from a bilateral breast reduction.  She is overall doing well however she has developed a rash along the inframammary fold incisions on both sides.  This is a punctate rash and does not appear to be infected.  She states that she has been sweating a lot with her binder.  I suspect that this is due to the sweating.  Overall she has excellent shape and size.  Well-perfused.  Incisions are intact.  There is some scabbing along both inframammary folds but the incisions themselves are closed.  She will use A&D ointment along the rash.  I have encouraged her to change to a lighter but still supportive bra.  Follow-up next week, sooner if she feels the rash is worsening.

## 2024-06-20 NOTE — Progress Notes (Signed)
 [facility] MD Outpatient Progress Note  Patricia Rivas  MRN: [id number]  Assessment:  Patricia Rivas presents for follow-up evaluation in-person. Patient is doing well today and seems to be managing her psychosocial stressors appropriately. Her psychiatric symptoms of depression with luteal worsening is being managed by her psychotropic medications along with birth control. She is currently going through menopause and she feels her medications are helping her symptoms stay controlled. She reports prior to seeing me, she was working with Dr. Nguyen on tapering off gabapentin  in which she is currently on 200 mg daily which she decreased a few days ago. Plan to stay on 200 mg for one month and if tolerant can decrease to 100 mg. She notes benefit from propranolol  with anxiety. In future appointments, can also consider tapering off Lamictal  if mood stability continue not being an issue.   Regarding diagnostic formulation as pt has prior bipolar 2 diagnosis, will also further clarify this in future appointments as it seem her sxms of hypo/mania including irritability, increased energy, increased activity with sense of loss control (shopping, cleaning), distractibility (starting multiple tasks and not completing them) occurred most severely during active use or right before the onset of her menstrual cycle improved greatly after OCP. Will f/u in about 1.5 months.   # MDD, atypical with luteal worsening, partial remission # GAD  Past medication trials: zoloft, prozac, celexa , wellbutrin , lamictal , gabapentin , propranolol  - Therapy: Patricia JONELLE Rosser, LCSW  -Continue Cymbalta  60 mg qAM (i5/02/2024) -Continue Wellbutrin  XL 150 mg qAM -Continue Lamictal  200 mg qAM -Continue Gabapentin  200 mg daily for one month -> decrease to 100 mg daily -Continue propranolol  10 mg TID PRN - daily in the morning, in the afternoon PRN around 3-4x/month  Patient was given contact information for behavioral health clinic and  was instructed to call 911 for emergencies.    Patient and plan of care will be discussed with the Attending MD, who agrees with the above statement and plan.   Identifying Information: Patricia Rivas is a 43 y.o. female with a PMH of MDD w luteal worsening, GAD, polysubstance use d/o in sustained remission (2011, bzo, etoh, opioids, stimulant, cannabis, nic), no suicide attempts or inpatient psych admission, who is an established patient with [facility] for management of mood d/o.   Subjective:  Chief Complaint:  Chief Complaint  Patient presents with   Follow-up   Interval History:   Patient seen alone.  Patient reports feeling good today. Since the previous visit, she reports her life was been stable. She states her child just started middle school and sometimes argues but is managing well. She denies feeling depressed. She notes having a breast reduction one month ago and is fairly tolerating. She states having a rash initially but is now resolved. She denies having periods of irritability and hypersomnia. Regarding medications, patient notes benefit regarding mood stability. She notes having brain fog, night sweats, and hot flashes regarding perimenopause in which she started noticing symptoms one year ago.  Patient reports the following adverse effects: denies. She is wanting to eventually decrease the Lamictal . Discussed that we can consider this in the future if her mood symptoms remain stable.   Patient reports good sleep. Patient reports good appetite.   Patient denies current SI, HI, and AVH.   Substance use: denies   Visit Diagnosis:    ICD-10-CM   1. MDD (major depressive disorder), recurrent episode, with atypical features, with luteal worsening ([name])  F33.9  2. GAD (generalized anxiety disorder)  F41.1       Past Psychiatric History:  Diagnoses:  MDD with luteal worsening and atypical features (previously PMDD, BiPD2, unspecified mood  disorder), GAD, panic d/o. acute stress reaction Historical dx of BiPD2 however on further review symptoms may be better explained by hormonal fluctuation + MDD with atypical features, and past substance use as mood symptoms in the past were most severe during active use or right before the onset of her menstrual cycle. Then resolved with combined OCP, which is not typical in hypo-/mania.  Nicotine use d/o, AUD, OUD, stimulant use d/o, cannabis use d/o, benzodiazepine use d/o - sustained remission since - sustained remission since 2011   Medication trials:  Zoloft (2012, up to 100 mg was drinking at the time), prozac - unsure why she stopped those, she suspect side effects, was drinking at the time.  Largely unable to remember any other past psych meds. Celexa  (dc 01/2024 for sexual side effects at >10 mg and only partial effectiveness) Cymbalta  (s4/2025 - current, no depressed mood and partially helpful for anxiety at 60 mg) Previous psychiatrist/therapist:  Seein psychiatry since around 43yo. Johnie Cahill, MD [phone]). [place] [facility] (2024-current) Hospitalizations: Denied Suicide attempts: Denied Hx of violence towards others: remotely when on substances Current access to guns: Denied Hx of trauma/abuse: see above  Substance Use History: EtOH: remission Nicotine:  reports that she quit smoking about 5 years ago. Her smoking use included cigarettes. She started smoking about 20 years ago. She has a 3.8 pack-year smoking history. She has never used smokeless tobacco. 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  Head trauma: Denied Seizures: Denied Allergies: Milk protein and Other   Family Psychiatric History:  Suicide: Maternal aunt attempted suicide x3. Friend completed suicide 07/2023.  Husband's friend overdosed 2023.  Homicide: Denied Psych hospitalization: Paternal great grandma-[place]ner for unclear reasons, maternal aunt BiPD: Maternal aunt SCZ/SCzA: Denied Substance use: Maternal grandma alcohol use disorder, dad with depression and anxiety qAM  Social History:  Housing: Living in [place], [place] with husband, daughter, dogs x2, chickens x6 Income: employed - Microbiologist at [employer] Education: GED and BA in hx in 2009  Marital Status: Married Children: Therapist, art (born 2014) Support: mom, husband, friends Legal: Denied DUI/DWI: Denied Jail/prison: Denied  Past Medical History:  Past Medical History:  Diagnosis Date   Alcohol dependence in remission ([name])    Anxiety    Anxiety state 05/11/2013   IMO SNOMED Dx Update Oct 2024     Depression    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  Contractions started within 2 hrs of rupture . Cx was closed with 80 % effaced. Magnesium  started to allow for steroids     Seasonal allergies    Sleep apnea    Thrombocytopenia ([name])    Thrombocytopenia, unspecified ([name]) 12/03/2012    Past Surgical History:  Procedure Laterality Date   BREAST REDUCTION SURGERY Bilateral 05/30/2024   Procedure: MAMMOPLASTY, REDUCTION;  Surgeon: Waddell Leonce NOVAK, MD;  Location: [facility];  Service: Plastics;  Laterality: Bilateral;   LAPAROSCOPIC GASTRIC SLEEVE RESECTION  06/14/2020   WISDOM TOOTH EXTRACTION     Family History:  Family History  Problem Relation Age of Onset   Depression Mother    Cancer Father        Prostate, colon   Depression Father    Cancer Maternal Aunt        Breast   Depression Maternal Aunt    Breast cancer Maternal Aunt    Depression Maternal Uncle    Breast cancer Maternal Uncle    Depression Paternal Aunt    Depression Paternal Uncle    Bipolar disorder Maternal Grandmother    Alcohol abuse Maternal Grandmother    Breast cancer Cousin    Bipolar disorder Cousin    Allergies:  Allergies  Allergen Reactions   Milk Protein Other (See Comments)    Dairy Digestive   Other Other (See Comments)    Tree Pollen    Current Medications: Current Outpatient Medications  Medication Sig Dispense Refill   Biotin 89999 MCG TABS Take 1 tablet by mouth.     buPROPion  (WELLBUTRIN  XL) 150 MG 24 hr tablet Take 1 tablet (150 mg total) by mouth every morning. 90 tablet 0   doxycycline  (VIBRA -TABS) 100 MG tablet Take 1 tablet (100 mg total) by mouth 2 (two) times daily. 10 tablet 0   DULoxetine  (CYMBALTA ) 60 MG capsule Take 1 capsule (60 mg total) by mouth every morning. 90 capsule 0   gabapentin  (NEURONTIN ) 100 MG capsule Take 1-3 capsules (100-300 mg total) by mouth 3 (three) times daily. Max 900 mg total a day 60 capsule 0    lamoTRIgine  (LAMICTAL ) 200 MG tablet Take 1 tablet (200 mg total) by mouth in the morning. 90 tablet 0   Multiple Vitamin (MULTI VITAMIN) TABS Take 5 mg by mouth at bedtime as needed.     norgestimate-ethinyl estradiol (ORTHO-CYCLEN) 0.25-35 MG-MCG tablet Take 1 tablet by mouth daily.     propranolol  (INDERAL ) 10 MG tablet Take 1 tablet (10 mg total) by mouth every 8 (eight) hours as needed. 45 tablet 0   rosuvastatin (CRESTOR) 20 MG tablet Take 20 mg by mouth daily.     triamcinolone  cream (KENALOG ) 0.1 % Apply 1 Application topically 2 (two) times daily. 30 g 0   No current facility-administered medications for this visit.    Objective:  Psychiatric Specialty Exam: General Appearance: appears at stated age, casually dressed and groomed   Behavior: pleasant and cooperative   Psychomotor Activity: no psychomotor agitation or retardation noted   Eye Contact: fair  Speech: normal amount, volume and fluency    Mood: euthymic  Affect: congruent, pleasant and interactive   Thought Process: linear, goal directed, no circumstantial or tangential thought process noted, no racing thoughts or flight of ideas  Descriptions of Associations:  intact   Thought Content Hallucinations: denies AH, VH , does not appear responding to stimuli  Delusions: no paranoia, delusions of control, grandeur, ideas of reference, thought broadcasting, and magical thinking  Suicidal Thoughts: denies SI, intention, plan  Homicidal Thoughts: denies HI, intention, plan   Alertness/Orientation: alert and fully oriented   Insight: fair Judgment: fair  Memory: intact   Executive Functions  Concentration: intact  Attention Span: fair  Recall: intact  Fund of Knowledge: fair   Physical Exam  General: Pleasant, well-appearing. No acute distress. Pulmonary: Normal effort. No wheezing or rales. Skin: No obvious rash or lesions. Neuro: A&Ox3.No focal deficit.  Review of Systems  No reported  symptoms  Metabolic Disorder Labs: No results found for: HGBA1C, MPG No results found for: PROLACTIN No results found for: CHOL, TRIG, HDL, CHOLHDL, VLDL, LDLCALC Lab Results  Component Value Date   TSH 0.702 11/26/2023   Therapeutic Level Labs: Lab Results  Component Value Date   LITHIUM  0.40 (L) 03/09/2015   No results found for: VALPROATE No results found for: CBMZ  Screenings: PHQ2-9    Flowsheet Row Video [facility] Video Visit from 02/15/2023 in [facility] Video Visit from 11[facility]23/2022 in [facility] Video Visit from 08/31/2022 in [facility] Video Visit from 06[facility]29/2022 in [facility]  PHQ-2 Total Score 1 0 0 0 0   Flowsheet Row Admission (Discharged) from 05/30/2024 in [facility] Video Visit from 03/29/2023 in [facility] Video Visit from 02/15/2023 in [facility]  C-SSRS RISK CATEGORY No Risk No Risk No Risk   Ismael Franco, MD PGY-3 Psychiatry Resident

## 2024-06-25 ENCOUNTER — Ambulatory Visit (INDEPENDENT_AMBULATORY_CARE_PROVIDER_SITE_OTHER): Admitting: Plastic Surgery

## 2024-06-25 DIAGNOSIS — R21 Rash and other nonspecific skin eruption: Secondary | ICD-10-CM

## 2024-06-25 DIAGNOSIS — Z9889 Other specified postprocedural states: Secondary | ICD-10-CM

## 2024-06-25 MED ORDER — DOXYCYCLINE HYCLATE 100 MG PO TABS: 100.0000 mg | ORAL_TABLET | Freq: Two times a day (BID) | ORAL | 0 refills | Status: AC

## 2024-06-25 MED ORDER — TRIAMCINOLONE ACETONIDE 0.1 % EX CREA
1.0000 | TOPICAL_CREAM | Freq: Two times a day (BID) | CUTANEOUS | 0 refills | Status: DC
Start: 2024-06-25 — End: 2024-06-30

## 2024-06-25 NOTE — Progress Notes (Signed)
 Patricia Rivas returns today for evaluation of the rash on her breast after breast reduction.  She states that she is feeling better though the rash seems to be more extensive.  She has less itching and the rash is dry, there is no drainage.  She has not having any pain.  She does not have any fevers or chills.  On examination she has great shape size and symmetry.  Nipples are warm and well-perfused.  The breasts are soft.  But she does have an extensive rash across both breasts which I cannot explain.  Will start her on a short course of triamcinolone  cream and cover her with a 5-day course of doxycycline .  She will return early next week for continued follow-up.

## 2024-06-26 ENCOUNTER — Encounter: Payer: Self-pay | Admitting: Plastic Surgery

## 2024-06-30 ENCOUNTER — Ambulatory Visit (HOSPITAL_BASED_OUTPATIENT_CLINIC_OR_DEPARTMENT_OTHER): Admitting: Psychiatry

## 2024-06-30 VITALS — BP 125/85 | Wt 209.2 lb

## 2024-06-30 DIAGNOSIS — F339 Major depressive disorder, recurrent, unspecified: Secondary | ICD-10-CM | POA: Diagnosis not present

## 2024-06-30 DIAGNOSIS — F3281 Premenstrual dysphoric disorder: Secondary | ICD-10-CM

## 2024-06-30 DIAGNOSIS — F1921 Other psychoactive substance dependence, in remission: Secondary | ICD-10-CM

## 2024-06-30 DIAGNOSIS — F411 Generalized anxiety disorder: Secondary | ICD-10-CM | POA: Diagnosis not present

## 2024-06-30 DIAGNOSIS — F3181 Bipolar II disorder: Secondary | ICD-10-CM

## 2024-06-30 MED ORDER — DULOXETINE HCL 60 MG PO CPEP: 60.0000 mg | ORAL_CAPSULE | Freq: Every morning | ORAL | 0 refills | Status: DC

## 2024-06-30 MED ORDER — LAMOTRIGINE 200 MG PO TABS: 200.0000 mg | ORAL_TABLET | Freq: Every morning | ORAL | 0 refills | Status: DC

## 2024-06-30 MED ORDER — GABAPENTIN 100 MG PO CAPS: 100.0000 mg | ORAL_CAPSULE | Freq: Three times a day (TID) | ORAL | 2 refills | Status: DC

## 2024-06-30 MED ORDER — BUPROPION HCL ER (XL) 150 MG PO TB24: 150.0000 mg | ORAL_TABLET | Freq: Every morning | ORAL | 0 refills | Status: DC

## 2024-06-30 MED ORDER — PROPRANOLOL HCL 10 MG PO TABS: 10.0000 mg | ORAL_TABLET | Freq: Two times a day (BID) | ORAL | 0 refills | Status: DC | PRN

## 2024-06-30 NOTE — Addendum Note (Signed)
 Addended by: CARVIN CROCK on: 06/30/2024 11:52 AM   Modules accepted: Level of Service

## 2024-07-02 ENCOUNTER — Encounter: Payer: Self-pay | Admitting: Plastic Surgery

## 2024-07-02 ENCOUNTER — Ambulatory Visit (INDEPENDENT_AMBULATORY_CARE_PROVIDER_SITE_OTHER): Admitting: Plastic Surgery

## 2024-07-02 VITALS — BP 128/86 | HR 87 | Ht 65.0 in | Wt 207.4 lb

## 2024-07-02 DIAGNOSIS — Z9889 Other specified postprocedural states: Secondary | ICD-10-CM

## 2024-07-02 NOTE — Progress Notes (Signed)
 Patricia Rivas returns today approximately 5 weeks postop from her bilateral breast reduction.  The rash around her incisions has completely resolved.  She has no discomfort.  No fevers or chills.  She does note the normal occasional staying within the breast, but otherwise is feeling good and doing well.  On examination the breasts look good there is no rash there is no erythema the incisions are all clean dry and intact and the nipples are warm and well-perfused.  She has excellent shape and symmetry.  She may return to activity as tolerated.  I would like for her to follow-up in 1 month for postoperative photographs.

## 2024-08-01 ENCOUNTER — Ambulatory Visit (INDEPENDENT_AMBULATORY_CARE_PROVIDER_SITE_OTHER): Admitting: Physician Assistant

## 2024-08-01 VITALS — BP 123/85 | HR 69

## 2024-08-01 DIAGNOSIS — Z9889 Other specified postprocedural states: Secondary | ICD-10-CM

## 2024-08-01 NOTE — Progress Notes (Signed)
 Patient is a pleasant 43 year old female s/p bilateral breast reduction performed 05/30/2024 by Dr. Waddell who presents to clinic for postoperative follow-up.  Reviewed notes and her postoperative recovery was complicated by an extensive rash.  She had been started on triamcinolone  as well as antibiotics.  She was seen most recently here in clinic on 07/02/2024.  At that time, her rashes had completely resolved.  Recommendation was for follow-up in 1 month for postoperative photographs.  Today, patient is doing well from a postoperative standpoint.  She states that her rash has completely resolved.  She has a small area of incisional scabbing on the lateral aspect of her right breast that she states has been getting better with antibiotic ointment.  Her neuropathy has been improving and overall she is exceedingly pleased with the outcome of her reduction surgery.  She inquires about when to begin scar treatments.  On exam, breasts have excellent shape and symmetry.  NAC's are healthy.  Incisions CDI and well-healed throughout with the exception of a small standing cone deformity involving inframammary incision right breast on the most lateral aspect.  No drainage or full-thickness wound.  No surrounding skin erythema or induration otherwise concerning for infection.  She also has a small area of firmness on the medial aspect of right breast, likely subsequent to a small hematoma that she developed shortly after surgery seen in her initial postoperative photos.    Recommending twice daily application of silicone scar gel.  Emphasized the importance of mechanical massage of the area of firmness right medial breast.  No ongoing restrictions at this time.  Follow-up only as needed.  She understands that if there is a persistent issue with the standing cone deformity on most lateral aspect of right breast that she can call the office and schedule an appointment to see Dr. Waddell.  However, it would likely continue  to settle and not be an issue.  Picture(s) obtained of the patient and placed in the chart were with the patient's or guardian's permission.

## 2024-08-05 ENCOUNTER — Ambulatory Visit (INDEPENDENT_AMBULATORY_CARE_PROVIDER_SITE_OTHER): Admitting: Licensed Clinical Social Worker

## 2024-08-05 DIAGNOSIS — F3181 Bipolar II disorder: Secondary | ICD-10-CM

## 2024-08-05 DIAGNOSIS — F411 Generalized anxiety disorder: Secondary | ICD-10-CM

## 2024-08-11 ENCOUNTER — Encounter (HOSPITAL_COMMUNITY): Payer: Self-pay | Admitting: Licensed Clinical Social Worker

## 2024-08-11 NOTE — Progress Notes (Signed)
 Virtual Visit via Video Note  I connected with Patricia Rivas on 08/05/24 at  8:00 AM EDT by a video enabled telemedicine application and verified that I am speaking with the correct person using two identifiers.  Location: Patient: home  Provider: office   I discussed the limitations of evaluation and management by telemedicine and the availability of in person appointments. The patient expressed understanding and agreed to proceed.     I discussed the assessment and treatment plan with the patient. The patient was provided an opportunity to ask questions and all were answered. The patient agreed with the plan and demonstrated an understanding of the instructions.   The patient was advised to call back or seek an in-person evaluation if the symptoms worsen or if the condition fails to improve as anticipated.  I provided 45 minutes of non-face-to-face time during this encounter.   Harlene JONELLE Rosser, LCSW   THERAPIST PROGRESS NOTE  Session Time: 8:00am-8:45am  Participation Level: Active  Behavioral Response: Well GroomedAlertEuthymic  Type of Therapy: Individual Therapy  Treatment Goals addressed: "Address my own feelings about my past behaviors and forgive myself so I can have better relationships with my child and husband".  Patricia Rivas will report improvement in her mood, behaviors, and her relationships 5 out of 7 days.      ProgressTowards Goals: Progressing  Interventions: CBT  Summary: Patricia Rivas is a 43 y.o. female who presents with GAD and Bipolar II.   Suicidal/Homicidal: Nowithout intent/plan  Therapist Response: Patricia Rivas engaged well in individual session with clinician via telehealth. Clinician utilized CBT to process thoughts, feelings, and interactions. Clinician explored updates in relationships with daughter and husband. Clinician provided resources to understand possible ADHD sxs in herself. Clinician explored relationship with daughter and concerns  about daughter's possible OCD/anxiety bxs.  Clinician processed use of coping skills and identified the importance of continuous use/practice.   Plan: Return again in 1-2 weeks.  Diagnosis: GAD (generalized anxiety disorder)  Bipolar II disorder ([name])  Collaboration of Care: Other none required  Patient/Guardian was advised Release of Information must be obtained prior to any record release in order to collaborate their care with an outside provider. Patient/Guardian was advised if they have not already done so to contact the registration department to sign all necessary forms in order for us  to release information regarding their care.   Consent: Patient/Guardian gives verbal consent for treatment and assignment of benefits for services provided during this visit. Patient/Guardian expressed understanding and agreed to proceed.   Harlene JONELLE [place], LCSW 08/11/2024

## 2024-08-11 NOTE — Progress Notes (Addendum)
 BH MD Outpatient Progress Note  Televisit via video: I connected with Patricia Rivas on 10/29 at 10:30 AM EDT by a video enabled telemedicine application and verified that I am speaking with the correct person using two identifiers.  Location: Patient: home Provider: office   I discussed the limitations of evaluation and management by telemedicine and the availability of in person appointments. The patient expressed understanding and agreed to proceed.  I discussed the assessment and treatment plan with the patient. The patient was provided an opportunity to ask questions and all were answered. The patient agreed with the plan and demonstrated an understanding of the instructions.   The patient was advised to call back or seek an in-person evaluation if the symptoms worsen or if the condition fails to improve as anticipated.   Patricia Rivas  MRN: [id number]  Assessment:  Patricia Rivas presents for virtual evaluation. In the prior visit, plan to stay on gabapentin  200 mg for one month and if tolerant can decrease to 100 mg.  Patient appears to be doing well today.  She notes ongoing anxiety regarding her psychosocial stressors but is very motivated and is utilizing her coping strategies along with behavioral activation that she discusses in her therapy sessions.  She did state that she is still on the 200 mg of gabapentin  due to her ongoing anxiety but now feels ready to decrease the dose to 100 mg.  I discussed with her to take note of her anxiety levels when she does decrease the dose and reflect as to the causes and encouraged her to utilize her propranolol  up to 3 times in the day if needed as she is mainly using it twice a day.  In the next appointment if patient continues to do well, can discontinue the gabapentin .  Follow-up in 6 weeks.  Regarding diagnostic formulation as pt has prior bipolar 2 diagnosis, it seem her sxms of hypo/mania including irritability, increased energy,  increased activity with sense of loss control (shopping, cleaning), distractibility (starting multiple tasks and not completing them) occurred most severely during active use or right before the onset of her menstrual cycle improved greatly after OCP.  If patient continues to do well from a psychiatric standpoint and bipolar disorder continues to be screened negative, can start tapering off Lamictal .  Wellbutrin  while it may be working on depression may also be worsening her anxiety so another consideration would be to taper this off as well in the future.   # MDD, atypical with luteal worsening, partial remission Past medication trials: zoloft, prozac, celexa , wellbutrin , lamictal , gabapentin , propranolol  -Continue Cymbalta  60 mg qAM (i5/02/2024) -Continue Wellbutrin  XL 150 mg qAM -Continue Lamictal  200 mg qAM  # GAD  -Decrease Gabapentin  to 100 mg daily  -Per PDMP, filled 10/16 -Continue propranolol  10 mg TID PRN  - Therapy: Harlene JONELLE Rosser, LCSW   Patient was given contact information for behavioral health clinic and was instructed to call 911 for emergencies.    Patient and plan of care will be discussed with the Attending MD, who agrees with the above statement and plan.   Identifying Information: Patricia Rivas is a 43 y.o. female with a PMH of MDD w luteal worsening, GAD, polysubstance use d/o in sustained remission (2011, bzo, etoh, opioids, stimulant, cannabis, nic), no suicide attempts or inpatient psych admission, who is an established patient with [facility] for management of mood d/o.   Subjective:  Chief Complaint: Med mgmt  Interval History:  Patient seen alone.  Patient reports feeling not too bad today. Stressors include her daughter just starting therapy.   Regarding psychiatric symptoms, she is currently denying sadness.  She takes her propranolol  in the morning and at lunch for anxiety for the most part and she feels it helps with  chest tightness. She is working with her therapist about her worries with her daughter and politics. 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 She notes starting to meditate again and states she does this every day. She notes joining the gym which has also helped with her mental health. She states getting overwhelmed when many people are talking to her at times and would try to find a quiet place which she states helps. She states that she has not decreased the gabapentin  to 100 mg because of her ongoing anxiety but she states that she now feels ready to decrease the dose. Discussed that she can utilize the propranolol  up to three times if she notices worsening anxiety when decreasing the gabapentin .  Patient reports good sleep. Patient reports good appetite.   Patient denies current SI, HI, and AVH.   Substance use:  Denies    Past Psychiatric History:  Diagnoses:  MDD with luteal worsening and atypical features (previously PMDD, BiPD2, unspecified mood disorder), GAD, panic d/o. acute stress reaction Historical dx of BiPD2 however on further review symptoms may be better explained by hormonal fluctuation + MDD with atypical features, and past substance use as mood symptoms in the past were most severe during active use or right before the onset of her menstrual cycle. Then resolved with combined OCP, which is not typical in hypo-/mania.  Nicotine use d/o, AUD, OUD, stimulant use d/o, cannabis use d/o, benzodiazepine use d/o - sustained remission since - sustained remission since 2011   Medication trials:  Zoloft (2012, up to 100 mg was drinking at the time), prozac - unsure why she stopped those, she suspect side effects, was drinking at the time.  Largely unable to remember any other past psych meds. Celexa  (dc 01/2024 for sexual side effects at >10 mg and only partial  effectiveness) Cymbalta  (s4/2025 - current, no depressed mood and partially helpful for anxiety at 60 mg) Previous psychiatrist/therapist:  Seein psychiatry since around 43yo. Johnie Cahill, MD [phone]). [place] [facility] (2024-current) Hospitalizations: Denied Suicide attempts: Denied Hx of violence towards others: remotely when on substances Current access to guns: Denied Hx of trauma/abuse: see above  Substance Use History: EtOH: remission Nicotine:  reports that she quit smoking about 5 years ago. Her smoking use included cigarettes. She started smoking about 20 years ago. She has a 3.8 pack-year smoking history. She has never used smokeless tobacco. 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  Head trauma: Denied Seizures: Denied Allergies: Milk protein and Other   Family Psychiatric History:  Suicide: Maternal aunt attempted suicide x3. Friend completed suicide 07/2023.  Husband's friend overdosed 2023.  Homicide: Denied Psych hospitalization: Paternal great grandma-[place]ner for unclear  reasons, maternal aunt BiPD: Maternal aunt SCZ/SCzA: Denied Substance use: Maternal grandma alcohol use disorder, dad with depression and anxiety qAM  Social History:  Housing: Living in [place], [place] with husband, daughter, dogs x2, chickens x6 Income: employed - microbiologist at [employer] Education: GED and BA in hx in 2009  Marital Status: Married Children: Therapist, Art (born 2014) Support: mom, husband, friends Legal: Denied DUI/DWI: Denied Jail/prison: Denied  Past Medical History:  Past Medical History:  Diagnosis Date   Alcohol dependence in remission ([name])    Anxiety    Anxiety state 05/11/2013   IMO SNOMED Dx Update Oct 2024     Depression    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  Contractions started within 2 hrs of rupture . Cx was closed with 80 % effaced. Magnesium  started to allow for steroids     Seasonal allergies    Sleep apnea    Thrombocytopenia    Thrombocytopenia, unspecified 12/03/2012    Past Surgical History:  Procedure Laterality Date   BREAST REDUCTION SURGERY Bilateral 05/30/2024   Procedure: MAMMOPLASTY, REDUCTION;  Surgeon: Waddell Leonce NOVAK, MD;  Location: [facility];  Service: Plastics;  Laterality: Bilateral;   LAPAROSCOPIC GASTRIC SLEEVE RESECTION  06/14/2020   WISDOM TOOTH EXTRACTION     Family History:  Family History  Problem Relation Age of Onset   Depression Mother    Cancer Father        Prostate, colon   Depression Father    Cancer Maternal Aunt        Breast   Depression  Maternal Aunt    Breast cancer Maternal Aunt    Depression Maternal Uncle    Breast cancer Maternal Uncle    Depression Paternal Aunt    Depression Paternal Uncle    Bipolar disorder Maternal Grandmother    Alcohol abuse Maternal Grandmother    Breast cancer Cousin    Bipolar disorder Cousin    Allergies:  Allergies  Allergen Reactions   Milk Protein Other (See Comments)    Dairy Digestive   Other Other (See Comments)    Tree Pollen    Current Medications: Current Outpatient Medications  Medication Sig Dispense Refill   Biotin 89999 MCG TABS Take 1 tablet by mouth.     buPROPion  (WELLBUTRIN  XL) 150 MG 24 hr tablet Take 1 tablet (150 mg total) by mouth every morning. 90 tablet 0   doxycycline  (VIBRA -TABS) 100 MG tablet Take 1 tablet (100 mg total) by mouth 2 (two) times daily. 10 tablet 0   DULoxetine  (CYMBALTA ) 60 MG capsule Take 1 capsule (60 mg total) by mouth every morning. 90 capsule 0   gabapentin  (NEURONTIN ) 100 MG capsule Take 1-2 capsules (100-200 mg total) by mouth 3 (three) times daily. 180 capsule 2   lamoTRIgine  (LAMICTAL ) 200 MG  tablet Take 1 tablet (200 mg total) by mouth in the morning. 90 tablet 0   Multiple Vitamin (MULTI VITAMIN) TABS Take 5 mg by mouth at bedtime as needed.     norgestimate-ethinyl estradiol (ORTHO-CYCLEN) 0.25-35 MG-MCG tablet Take 1 tablet by mouth daily.     propranolol  (INDERAL ) 10 MG tablet Take 1 tablet (10 mg total) by mouth 2 (two) times daily as needed (anxiety). 180 tablet 0   rosuvastatin (CRESTOR) 20 MG tablet Take 20 mg by mouth daily.     No current facility-administered medications for this visit.    Objective:  Psychiatric Specialty Exam: General Appearance: appears at stated age, casually dressed and groomed   Behavior: pleasant and cooperative   Psychomotor Activity: no psychomotor agitation or retardation noted   Eye Contact: fair  Speech: normal amount, volume and fluency    Mood: euthymic, anxious  Affect:  congruent, pleasant and interactive   Thought Process: linear, goal directed, no circumstantial or tangential thought process noted, no racing thoughts or flight of ideas  Descriptions of Associations: intact   Thought Content Hallucinations: denies AH, VH , does not appear responding to stimuli  Delusions: no paranoia, delusions of control, grandeur, ideas of reference, thought broadcasting, and magical thinking  Suicidal Thoughts: denies SI, intention, plan  Homicidal Thoughts: denies HI, intention, plan   Alertness/Orientation: alert and fully oriented   Insight: fair Judgment: fair  Memory: intact   Executive Functions  Concentration: intact  Attention Span: fair  Recall: intact  Fund of Knowledge: fair   Physical Exam  General: Pleasant, well-appearing . No acute distress. Pulmonary: Normal effort. No wheezing or rales. Neuro: A&Ox3.No focal deficit.  Review of Systems  No reported symptoms   Metabolic Disorder Labs: No results found for: HGBA1C, MPG No results found for: PROLACTIN No results found for: CHOL, TRIG, HDL, CHOLHDL, VLDL, LDLCALC Lab Results  Component Value Date   TSH 0.702 11/26/2023   Therapeutic Level Labs: Lab Results  Component Value Date   LITHIUM  0.40 (L) 03/09/2015   No results found for: VALPROATE No results found for: CBMZ  Screenings: PHQ2-9    Flowsheet Row [facility] Video Visit from 02/15/2023 in [facility] Video Visit from 11[facility]23/2022 [facility] Video Visit from 08/31/2022 [facility] Video Visit from 06[facility]29/2022 [facility]  PHQ-2 Total Score 1 0 0 0 0   Flowsheet Row Admission (Discharged) from 05/30/2024 in [facility] Video Visit from 02/15/2023 in [facility]  C-SSRS RISK CATEGORY No Risk No Risk No Risk   Ismael Franco, MD PGY-3 Psychiatry Resident

## 2024-08-12 ENCOUNTER — Ambulatory Visit (INDEPENDENT_AMBULATORY_CARE_PROVIDER_SITE_OTHER): Admitting: Licensed Clinical Social Worker

## 2024-08-12 DIAGNOSIS — F3181 Bipolar II disorder: Secondary | ICD-10-CM

## 2024-08-12 DIAGNOSIS — F411 Generalized anxiety disorder: Secondary | ICD-10-CM | POA: Diagnosis not present

## 2024-08-18 ENCOUNTER — Encounter: Payer: Self-pay | Admitting: Plastic Surgery

## 2024-08-20 ENCOUNTER — Telehealth (HOSPITAL_COMMUNITY): Admitting: Psychiatry

## 2024-08-20 DIAGNOSIS — F411 Generalized anxiety disorder: Secondary | ICD-10-CM

## 2024-08-20 DIAGNOSIS — F339 Major depressive disorder, recurrent, unspecified: Secondary | ICD-10-CM | POA: Diagnosis not present

## 2024-08-20 DIAGNOSIS — F1921 Other psychoactive substance dependence, in remission: Secondary | ICD-10-CM | POA: Diagnosis not present

## 2024-08-20 NOTE — Addendum Note (Signed)
 Addended by: CARVIN CROCK on: 08/20/2024 11:20 AM   Modules accepted: Level of Service

## 2024-08-20 NOTE — Addendum Note (Signed)
 Addended by: IZELLA CARAWAY B on: 08/20/2024 10:57 AM   Modules accepted: Level of Service

## 2024-08-21 ENCOUNTER — Encounter (HOSPITAL_COMMUNITY): Payer: Self-pay | Admitting: Licensed Clinical Social Worker

## 2024-08-21 NOTE — Progress Notes (Signed)
 Virtual Visit via Video Note  I connected with Felisia L Estabrooks on 08/12/24 at  8:00 AM EDT by a video enabled telemedicine application and verified that I am speaking with the correct person using two identifiers.  Location: Patient: home Provider: office   I discussed the limitations of evaluation and management by telemedicine and the availability of in person appointments. The patient expressed understanding and agreed to proceed.     I discussed the assessment and treatment plan with the patient. The patient was provided an opportunity to ask questions and all were answered. The patient agreed with the plan and demonstrated an understanding of the instructions.   The patient was advised to call back or seek an in-person evaluation if the symptoms worsen or if the condition fails to improve as anticipated.  I provided 45 minutes of non-face-to-face time during this encounter.   Harlene JONELLE Rosser, LCSW   THERAPIST PROGRESS NOTE  Session Time: 8:00am-8:45am  Participation Level: Active  Behavioral Response: CasualAlertDepressed  Type of Therapy: Individual Therapy  Treatment Goals addressed: "Address my own feelings about my past behaviors and forgive myself so I can have better relationships with my child and husband".  Adlynn will report improvement in her mood, behaviors, and her relationships 5 out of 7 days.    ProgressTowards Goals: Progressing  Interventions: CBT  Summary: FELISA ZECHMAN is a 43 y.o. female who presents with GAD and Bipolar 2..   Suicidal/Homicidal: Nowithout intent/plan  Therapist Response: Sybrina engaged well in individual virtual session with clinician. Clinician utilized CBT to process thoughts, feelings, and behaviors. Clinician explored interactions with family and noted continuing challenges with managing daughter's behavior. Clinician provided feedback and psychoeducation about parenting an adolescent girl. Clinician also encouraged  Caterra to think back to her own experiences as a pre-teen. Clinician identified the urge to compare her experience to that of her daughter. However, the circumstances are intentionally different, due to Vivianne's experience being parented by 2 alcoholics. Clinician reminded Dell about work done in [place]. Clinician also identified the importance of forgiveness and not holding grudges.   Plan: Return again in 2 weeks.  Diagnosis: GAD (generalized anxiety disorder)  Bipolar II disorder ([name])  Collaboration of Care: Patient refused AEB none required  Patient/Guardian was advised Release of Information must be obtained prior to any record release in order to collaborate their care with an outside provider. Patient/Guardian was advised if they have not already done so to contact the registration department to sign all necessary forms in order for us  to release information regarding their care.   Consent: Patient/Guardian gives verbal consent for treatment and assignment of benefits for services provided during this visit. Patient/Guardian expressed understanding and agreed to proceed.   Harlene JONELLE [place], LCSW 08/21/2024

## 2024-08-26 ENCOUNTER — Encounter (HOSPITAL_COMMUNITY): Payer: Self-pay | Admitting: Licensed Clinical Social Worker

## 2024-08-26 ENCOUNTER — Ambulatory Visit (INDEPENDENT_AMBULATORY_CARE_PROVIDER_SITE_OTHER): Admitting: Licensed Clinical Social Worker

## 2024-08-26 DIAGNOSIS — F411 Generalized anxiety disorder: Secondary | ICD-10-CM

## 2024-08-26 NOTE — Progress Notes (Signed)
 Virtual Visit via Video Note  I connected with Patricia Rivas on 08/26/24 at  8:00 AM EST by a video enabled telemedicine application and verified that I am speaking with the correct person using two identifiers.  Location: Patient: home Provider: home office   I discussed the limitations of evaluation and management by telemedicine and the availability of in person appointments. The patient expressed understanding and agreed to proceed.   I discussed the assessment and treatment plan with the patient. The patient was provided an opportunity to ask questions and all were answered. The patient agreed with the plan and demonstrated an understanding of the instructions.   The patient was advised to call back or seek an in-person evaluation if the symptoms worsen or if the condition fails to improve as anticipated.  I provided 55 minutes of non-face-to-face time during this encounter.   Patricia JONELLE Rosser, LCSW   THERAPIST PROGRESS NOTE  Session Time: 8:00am-8:55am  Participation Level: Active  Behavioral Response: NeatAlertNegative  Type of Therapy: Individual Therapy  Treatment Goals addressed: "Address my own feelings about my past behaviors and forgive myself so I can have better relationships with my child and husband".  Patricia Rivas will report improvement in her mood, behaviors, and her relationships 5 out of 7 days.    ProgressTowards Goals: Progressing  Interventions: CBT and Motivational Interviewing  Summary: Patricia Rivas is a 43 y.o. female who presents with GAD.   Suicidal/Homicidal: Nowithout intent/plan  Therapist Response: Patricia Rivas engaged well in individual virtual session with clinician. Clinician utilized CBT and MI OARS to reflect and summarize thoughts, feelings, and interactions. Clinician processed recent dreams about a former boyfriend who was with her during her most toxic stage of addiction. Clinician provided time and space to process the dream,  noting that she felt he was pushing her away and wouldn't listen to her or talk to her. Clinician discussed the meaning making of this dream and explored a sense of incompletion in that amends in her Step 4-5. Clinician encouraged Patricia Rivas to do some letter writing to this ex in order to release this and gain closure. Clinician explored relationship with child and husband. Clinician noted a lot of change between husband's hard pivot from conservative to ultra liberal. Clinician also identified the changes Patricia Rivas has made in her life and body, with breast reduction. Clinician reflected how this has impacted her daughter's anxiety/OCD.   Plan: Return again in 2 weeks.  Diagnosis: GAD (generalized anxiety disorder)  Collaboration of Care: Medication Management AEB reviewed note from Dr. Izella  Patient/Guardian was advised Release of Information must be obtained prior to any record release in order to collaborate their care with an outside provider. Patient/Guardian was advised if they have not already done so to contact the registration department to sign all necessary forms in order for us  to release information regarding their care.   Consent: Patient/Guardian gives verbal consent for treatment and assignment of benefits for services provided during this visit. Patient/Guardian expressed understanding and agreed to proceed.   Patricia JONELLE [place], LCSW 08/26/2024

## 2024-09-09 ENCOUNTER — Encounter (HOSPITAL_COMMUNITY): Payer: Self-pay

## 2024-09-09 ENCOUNTER — Ambulatory Visit (HOSPITAL_COMMUNITY): Admitting: Licensed Clinical Social Worker

## 2024-09-22 NOTE — Progress Notes (Signed)
 BH MD Outpatient Progress Note  Televisit via video: I connected with Patricia Rivas on 12/10 at 10:30 AM EST by a video enabled telemedicine application and verified that I am speaking with the correct person using two identifiers.  Location: Patient: home Provider: office   I discussed the limitations of evaluation and management by telemedicine and the availability of in person appointments. The patient expressed understanding and agreed to proceed.  I discussed the assessment and treatment plan with the patient. The patient was provided an opportunity to ask questions and all were answered. The patient agreed with the plan and demonstrated an understanding of the instructions.   The patient was advised to call back or seek an in-person evaluation if the symptoms worsen or if the condition fails to improve as anticipated.   Patricia Rivas  MRN: [id number]  Assessment:  Patricia Rivas presents for virtual evaluation. In the prior visit, decreased gabapentin  to 100 mg.   Today, patient presents with improved symptoms of depression and anxiety, noting benefit from improved communication skills with her family, therapy sessions, and her medications.  She shows good judgment as she plans on increasing physical activity which will also likely benefit her mental health.  With the improvement of her anxiety and her goal to ultimately be off of gabapentin , plan to stop gabapentin  at the end of the year.  We will maintain her other medications at this time.  Follow-up in 2 months.  # MDD, atypical with luteal worsening, partial remission Past medication trials: zoloft, prozac, celexa , wellbutrin , lamictal , gabapentin , propranolol  -Continue Cymbalta  60 mg qAM  -Continue Wellbutrin  XL 150 mg qAM -Continue Lamictal  200 mg qAM  # GAD  -Continue Gabapentin  to 100 mg daily, patient plans to discontinue on 12/31 -Continue propranolol  10 mg TID PRN  - Therapy: Harlene JONELLE Rosser, LCSW    Patient was given contact information for behavioral health clinic and was instructed to call 911 for emergencies.    Patient and plan of care will be discussed with the Attending MD, who agrees with the above statement and plan.   Identifying Information: Patricia Rivas is a 43 y.o. female with a PMH of MDD w luteal worsening, GAD, polysubstance use d/o in sustained remission (2011, bzo, etoh, opioids, stimulant, cannabis, nic), no suicide attempts or inpatient psych admission, who is an established patient with [facility] for management of mood d/o.   Subjective:  Chief Complaint: Med mgmt  Interval History:   Patient seen alone.  Patient reports feeling pretty good today. Since the previous visit, she reports feeling well. She feels ready to stop the gabapentin  and feels the propranolol  is helpful, stating she is only using a few times a week instead of twice a day. She notes the anxiety is getting better and attributes it to doing things differently like her home life with her husband. Her daughter is also getting psychiatric help and she is slowly improving. She feels her and her husband are communicating more effectively. She also notes therapy for herself is going well. She is practicing expressing her feeling more and she feels more free.   Patient reports good sleep, reporting occasional night sweats. Patient reports good appetite, she feels it increases in the winter. She states joining the gym and plans to work out more.   Patient denies current SI, HI, and AVH.   Substance use:  Denies   Past Psychiatric History:  Diagnoses:  MDD with luteal worsening and atypical features (  previously PMDD, BiPD2, unspecified mood disorder), GAD, panic d/o. acute stress reaction Historical dx of BiPD2 however on further review symptoms may be better explained by hormonal fluctuation + MDD with atypical features, and past substance use as mood symptoms in the past  were most severe during active use or right before the onset of her menstrual cycle. Then resolved with combined OCP, which is not typical in hypo-/mania.  Nicotine use d/o, AUD, OUD, stimulant use d/o, cannabis use d/o, benzodiazepine use d/o - sustained remission since - sustained remission since 2011   Medication trials:  Zoloft (2012, up to 100 mg was drinking at the time), prozac - unsure why she stopped those, she suspect side effects, was drinking at the time.  Largely unable to remember any other past psych meds. Celexa  (dc 01/2024 for sexual side effects at >10 mg and only partial effectiveness) Cymbalta  (s4/2025 - current, no depressed mood and partially helpful for anxiety at 60 mg) Previous psychiatrist/therapist:  Seein psychiatry since around 43yo. Johnie Cahill, MD [phone]). [place] [facility] (2024-current) Hospitalizations: Denied Suicide attempts: Denied Hx of violence towards others: remotely when on substances Current access to guns: Denied Hx of trauma/abuse: see above  Substance Use History: EtOH: remission Nicotine:  reports that she quit smoking about 5 years ago. Her smoking use included cigarettes. She started smoking about 20 years ago. She has a 3.8 pack-year smoking history. She has never used smokeless tobacco. 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  Head trauma: Denied Seizures: Denied Allergies: Milk protein and Other   Family Psychiatric History:  Suicide: Maternal aunt attempted suicide x3. Friend completed suicide 07/2023.  Husband's friend overdosed 2023.  Homicide: Denied Psych hospitalization: Paternal great grandma-Butner for unclear reasons, maternal aunt BiPD: Maternal aunt SCZ/SCzA: Denied Substance use: Maternal grandma alcohol use disorder, dad with depression and anxiety qAM  Social History:  Housing: Living in [place], [place] with husband, daughter, dogs x2, chickens x6 Income: employed - microbiologist at [employer] Education: GED and BA in hx in 2009  Marital Status: Married Children: Therapist, Art (born 2014) Support: mom, husband, friends Legal: Denied DUI/DWI: Denied Jail/prison: Denied  Past Medical History:  Past Medical History:  Diagnosis Date   Alcohol dependence in remission ([name])    Anxiety    Anxiety state 05/11/2013   IMO SNOMED Dx Update Oct 2024     Depression    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  Contractions started within 2 hrs of rupture . Cx was closed with 80 % effaced. Magnesium  started to allow for steroids     Seasonal allergies    Sleep apnea     Thrombocytopenia    Thrombocytopenia, unspecified 12/03/2012    Past Surgical History:  Procedure Laterality Date   BREAST REDUCTION SURGERY Bilateral 05/30/2024   Procedure: MAMMOPLASTY, REDUCTION;  Surgeon: Waddell Leonce NOVAK, MD;  Location: [facility];  Service: Plastics;  Laterality: Bilateral;   LAPAROSCOPIC GASTRIC SLEEVE RESECTION  06/14/2020   WISDOM TOOTH EXTRACTION     Family History:  Family History  Problem Relation Age of Onset   Depression Mother    Cancer Father        Prostate, colon   Depression Father    Cancer Maternal Aunt        Breast   Depression Maternal Aunt    Breast cancer Maternal Aunt    Depression Maternal Uncle    Breast cancer Maternal Uncle    Depression Paternal Aunt    Depression Paternal Uncle    Bipolar disorder Maternal Grandmother    Alcohol abuse Maternal Grandmother    Breast cancer Cousin    Bipolar disorder Cousin    Allergies:  Allergies  Allergen Reactions   Milk Protein Other (See Comments)    Dairy Digestive   Other Other (See Comments)    Tree Pollen    Current Medications: Current Outpatient Medications  Medication Sig Dispense Refill   Biotin 89999 MCG TABS Take 1 tablet by mouth.     buPROPion  (WELLBUTRIN  XL) 150 MG 24 hr tablet Take 1 tablet (150 mg total) by mouth every morning. 90 tablet 0   doxycycline  (VIBRA -TABS) 100 MG tablet Take 1 tablet (100 mg total) by mouth 2 (two) times daily. 10 tablet 0   DULoxetine  (CYMBALTA ) 60 MG capsule Take 1 capsule (60 mg total) by mouth every morning. 90 capsule 0   gabapentin  (NEURONTIN ) 100 MG capsule Take 1-2 capsules (100-200 mg total) by mouth 3 (three) times daily. 180 capsule 2   lamoTRIgine  (LAMICTAL ) 200 MG tablet Take 1 tablet (200 mg total) by mouth in the morning. 90 tablet 0   Multiple Vitamin (MULTI VITAMIN) TABS Take 5 mg by mouth at bedtime as needed.     norgestimate-ethinyl estradiol (ORTHO-CYCLEN) 0.25-35 MG-MCG tablet Take 1 tablet by mouth  daily.     propranolol  (INDERAL ) 10 MG tablet Take 1 tablet (10 mg total) by mouth 2 (two) times daily as needed (anxiety). 180 tablet 0   rosuvastatin (CRESTOR) 20 MG tablet Take 20 mg by mouth daily.     No current facility-administered medications for this visit.    Objective: Psychiatric Specialty Exam: General Appearance: appears at stated age  Behavior: pleasant and cooperative   Psychomotor Activity: no psychomotor agitation or retardation noted   Eye Contact: fair  Speech: normal amount, volume and fluency    Mood: euthymic  Affect: congruent, pleasant and interactive   Thought Process: linear, goal directed, no circumstantial or tangential thought process noted, no racing thoughts or flight of ideas  Descriptions of Associations: intact   Thought Content Hallucinations: denies AH, VH , does not appear responding to stimuli  Delusions: no paranoia, delusions of control, grandeur, ideas of reference, thought broadcasting, and magical thinking  Suicidal Thoughts: denies SI, intention, plan  Homicidal Thoughts: denies HI, intention, plan   Alertness/Orientation: alert and fully oriented   Insight: good Judgment: good  Memory: intact   Executive Functions  Concentration: intact  Attention Span: fair  Recall: intact  Fund of Knowledge: fair   Physical Exam  General: Pleasant, well-appearing. No acute distress. Pulmonary: Normal effort. No wheezing or rales. Neuro: A&Ox3.No focal deficit.  Review of Systems  No reported symptoms   Metabolic Disorder Labs: No results found for: HGBA1C, MPG No results found for: PROLACTIN No results found for: CHOL, TRIG, HDL, CHOLHDL, VLDL, LDLCALC Lab Results  Component Value Date   TSH 0.702 11/26/2023   Therapeutic Level Labs: Lab Results  Component Value Date   LITHIUM  0.40 (L) 03/09/2015   No results found for: VALPROATE No results found for: CBMZ  Screenings: PHQ2-9    Flowsheet Row  Video [facility] in [facility] Video Visit from 02[facility]15/2023 in [facility] Video Visit from 11/23/2022 in [facility] Video Visit from 08/31/2022 in [facility] Video Visit from 06/29/2022 in [facility]  PHQ-2 Total Score 1 0 0 0 0   Flowsheet Row Admission (Discharged) from 05/30/2024 in [facility] Video Visit from 03/29/2023 in [facility] Video Visit from 02/15/2023 in [facility]  C-SSRS RISK CATEGORY No Risk No Risk No Risk   Ismael Franco, MD PGY-3 Psychiatry Resident

## 2024-09-30 ENCOUNTER — Other Ambulatory Visit (HOSPITAL_COMMUNITY): Payer: Self-pay | Admitting: Psychiatry

## 2024-09-30 DIAGNOSIS — F411 Generalized anxiety disorder: Secondary | ICD-10-CM

## 2024-10-01 ENCOUNTER — Telehealth (HOSPITAL_COMMUNITY): Admitting: Psychiatry

## 2024-10-01 DIAGNOSIS — F411 Generalized anxiety disorder: Secondary | ICD-10-CM | POA: Diagnosis not present

## 2024-10-01 DIAGNOSIS — F3341 Major depressive disorder, recurrent, in partial remission: Secondary | ICD-10-CM

## 2024-10-01 DIAGNOSIS — F339 Major depressive disorder, recurrent, unspecified: Secondary | ICD-10-CM

## 2024-10-01 DIAGNOSIS — F1921 Other psychoactive substance dependence, in remission: Secondary | ICD-10-CM

## 2024-10-01 MED ORDER — BUPROPION HCL ER (XL) 150 MG PO TB24: 150.0000 mg | ORAL_TABLET | Freq: Every morning | ORAL | 0 refills | Status: AC

## 2024-10-01 MED ORDER — LAMOTRIGINE 200 MG PO TABS: 200.0000 mg | ORAL_TABLET | Freq: Every morning | ORAL | 0 refills | Status: AC

## 2024-10-01 MED ORDER — PROPRANOLOL HCL 10 MG PO TABS: 10.0000 mg | ORAL_TABLET | Freq: Two times a day (BID) | ORAL | 0 refills | Status: AC | PRN

## 2024-10-01 MED ORDER — GABAPENTIN 100 MG PO CAPS: 100.0000 mg | ORAL_CAPSULE | Freq: Three times a day (TID) | ORAL | 2 refills | Status: DC

## 2024-10-01 MED ORDER — DULOXETINE HCL 60 MG PO CPEP: 60.0000 mg | ORAL_CAPSULE | Freq: Every morning | ORAL | 0 refills | Status: AC

## 2024-10-01 NOTE — Addendum Note (Signed)
 Addended by: CARVIN CROCK on: 10/01/2024 11:31 AM   Modules accepted: Level of Service

## 2024-10-01 NOTE — Addendum Note (Signed)
 Addended by: IZELLA CARAWAY B on: 10/01/2024 07:54 AM   Modules accepted: Orders

## 2024-10-08 ENCOUNTER — Ambulatory Visit (HOSPITAL_COMMUNITY): Admitting: Licensed Clinical Social Worker

## 2024-11-01 ENCOUNTER — Other Ambulatory Visit: Payer: Self-pay | Admitting: Plastic Surgery

## 2024-11-25 ENCOUNTER — Ambulatory Visit (INDEPENDENT_AMBULATORY_CARE_PROVIDER_SITE_OTHER): Admitting: Licensed Clinical Social Worker

## 2024-11-25 ENCOUNTER — Encounter (HOSPITAL_COMMUNITY): Payer: Self-pay | Admitting: Licensed Clinical Social Worker

## 2024-11-25 ENCOUNTER — Encounter (HOSPITAL_COMMUNITY): Payer: Self-pay

## 2024-11-25 DIAGNOSIS — F411 Generalized anxiety disorder: Secondary | ICD-10-CM

## 2024-11-25 DIAGNOSIS — F3181 Bipolar II disorder: Secondary | ICD-10-CM

## 2024-11-25 NOTE — Progress Notes (Signed)
 Virtual Visit via Video Note  I connected with Sylvana L Wight on 11/25/24 at  8:00 AM EST by a video enabled telemedicine application and verified that I am speaking with the correct person using two identifiers.  Location: Patient: home  Provider: home office   I discussed the limitations of evaluation and management by telemedicine and the availability of in person appointments. The patient expressed understanding and agreed to proceed.    I discussed the assessment and treatment plan with the patient. The patient was provided an opportunity to ask questions and all were answered. The patient agreed with the plan and demonstrated an understanding of the instructions.   The patient was advised to call back or seek an in-person evaluation if the symptoms worsen or if the condition fails to improve as anticipated.  I provided 60 minutes of non-face-to-face time during this encounter.   Harlene JONELLE Rosser, LCSW   THERAPIST PROGRESS NOTE  Session Time: 8:00am-9:00am  Participation Level: Active  Behavioral Response: Well GroomedAlertDepressed  Type of Therapy: Individual Therapy  Treatment Goals addressed:  Active     BH CCP Acute or Chronic Trauma Reaction     LTG: Recall traumatic events without becoming overwhelmed with negative emotions     Start:  11/25/24    Expected End:  11/24/25         STG: Wyonia will identify coping strategies to deal with trauma memories and the associated emotional reaction     Start:  11/25/24    Expected End:  11/24/25         STG: Hava will verbalize an increased sense of mastery over PTSD symptoms by using several techniques to cope with flashbacks, decrease the power of triggers, and decrease negative thinking     Start:  11/25/24    Expected End:  11/24/25            ProgressTowards Goals: Progressing  Interventions: Motivational Interviewing and Supportive  Summary: CHRISTIONNA POLAND is a 44 y.o. female who presents  with GAD and Bipolar 2.   Suicidal/Homicidal: Nowithout intent/plan  Therapist Response: Chalice engaged well in individual virtual session with clinician. Clinician utilized MI OARS and supportive counseling to process thoughts, feelings, and interactions. Clinician explored updates and noted increased challenges with getting restful sleep due to dreaming a lot. Clinician discussed the content and messages in the dreams, noting that they were related to time between ages [age] when first major depression occurred. Clinician provided time and space for Jerrika to share the context and to process what was happening at the time. Clinician provided psychoeducation about trauma, noting that the first time and the worst time are often processed primarily to treat Acute Stress and PTSD.  Burna agreed to do some writing in order to get her thoughts together and to try to understand how the experiences then continue to play out consequences now regarding shame and confidence.   Plan: Return again in 1 weeks.  Diagnosis: GAD (generalized anxiety disorder)  Bipolar II disorder ([name])  Collaboration of Care: Patient refused AEB none required.   Patient/Guardian was advised Release of Information must be obtained prior to any record release in order to collaborate their care with an outside provider. Patient/Guardian was advised if they have not already done so to contact the registration department to sign all necessary forms in order for us  to release information regarding their care.   Consent: Patient/Guardian gives verbal consent for treatment and assignment of benefits for services provided during  this visit. Patient/Guardian expressed understanding and agreed to proceed.   Harlene SAUNDERS [place], LCSW 11/25/2024

## 2024-12-04 ENCOUNTER — Ambulatory Visit (HOSPITAL_COMMUNITY): Admitting: Licensed Clinical Social Worker
